# Patient Record
Sex: Male | Born: 1970 | Race: Black or African American | Hispanic: No | Marital: Single | Smoking: Current every day smoker
Health system: Southern US, Community
[De-identification: ages and names within clinical notes are randomized; demographics above are authoritative.]

## PROBLEM LIST (undated history)

## (undated) DIAGNOSIS — G479 Sleep disorder, unspecified: Secondary | ICD-10-CM

## (undated) DIAGNOSIS — F419 Anxiety disorder, unspecified: Secondary | ICD-10-CM

## (undated) DIAGNOSIS — F319 Bipolar disorder, unspecified: Secondary | ICD-10-CM

## (undated) DIAGNOSIS — E119 Type 2 diabetes mellitus without complications: Secondary | ICD-10-CM

## (undated) DIAGNOSIS — F209 Schizophrenia, unspecified: Secondary | ICD-10-CM

## (undated) DIAGNOSIS — Z720 Tobacco use: Secondary | ICD-10-CM

## (undated) DIAGNOSIS — F259 Schizoaffective disorder, unspecified: Secondary | ICD-10-CM

## (undated) DIAGNOSIS — F32A Depression, unspecified: Secondary | ICD-10-CM

## (undated) DIAGNOSIS — F1410 Cocaine abuse, uncomplicated: Secondary | ICD-10-CM

## (undated) DIAGNOSIS — F1010 Alcohol abuse, uncomplicated: Secondary | ICD-10-CM

## (undated) DIAGNOSIS — R45851 Suicidal ideations: Secondary | ICD-10-CM

## (undated) DIAGNOSIS — F329 Major depressive disorder, single episode, unspecified: Secondary | ICD-10-CM

## (undated) DIAGNOSIS — F1910 Other psychoactive substance abuse, uncomplicated: Secondary | ICD-10-CM

## (undated) DIAGNOSIS — F1210 Cannabis abuse, uncomplicated: Secondary | ICD-10-CM

---

## 1997-11-27 ENCOUNTER — Emergency Department (HOSPITAL_COMMUNITY): Admission: EM | Admit: 1997-11-27 | Discharge: 1997-11-27 | Payer: Self-pay | Admitting: Emergency Medicine

## 1998-04-27 ENCOUNTER — Emergency Department (HOSPITAL_COMMUNITY): Admission: EM | Admit: 1998-04-27 | Discharge: 1998-04-27 | Payer: Self-pay | Admitting: Emergency Medicine

## 1998-05-01 ENCOUNTER — Emergency Department (HOSPITAL_COMMUNITY): Admission: EM | Admit: 1998-05-01 | Discharge: 1998-05-01 | Payer: Self-pay | Admitting: Emergency Medicine

## 1998-05-04 ENCOUNTER — Emergency Department (HOSPITAL_COMMUNITY): Admission: EM | Admit: 1998-05-04 | Discharge: 1998-05-04 | Payer: Self-pay | Admitting: Emergency Medicine

## 1998-05-04 ENCOUNTER — Encounter: Payer: Self-pay | Admitting: Emergency Medicine

## 2001-03-29 ENCOUNTER — Encounter: Payer: Self-pay | Admitting: Internal Medicine

## 2001-03-29 ENCOUNTER — Inpatient Hospital Stay (HOSPITAL_COMMUNITY): Admission: EM | Admit: 2001-03-29 | Discharge: 2001-03-30 | Payer: Self-pay | Admitting: Emergency Medicine

## 2001-03-29 ENCOUNTER — Encounter: Payer: Self-pay | Admitting: Emergency Medicine

## 2002-07-09 ENCOUNTER — Encounter: Payer: Self-pay | Admitting: Emergency Medicine

## 2002-07-09 ENCOUNTER — Emergency Department (HOSPITAL_COMMUNITY): Admission: EM | Admit: 2002-07-09 | Discharge: 2002-07-09 | Payer: Self-pay | Admitting: *Deleted

## 2002-07-09 ENCOUNTER — Encounter: Payer: Self-pay | Admitting: *Deleted

## 2002-10-14 ENCOUNTER — Emergency Department (HOSPITAL_COMMUNITY): Admission: EM | Admit: 2002-10-14 | Discharge: 2002-10-14 | Payer: Self-pay | Admitting: Emergency Medicine

## 2003-02-04 ENCOUNTER — Emergency Department (HOSPITAL_COMMUNITY): Admission: EM | Admit: 2003-02-04 | Discharge: 2003-02-04 | Payer: Self-pay | Admitting: Emergency Medicine

## 2003-09-05 ENCOUNTER — Emergency Department (HOSPITAL_COMMUNITY): Admission: EM | Admit: 2003-09-05 | Discharge: 2003-09-05 | Payer: Self-pay | Admitting: Physical Therapy

## 2007-09-07 ENCOUNTER — Emergency Department (HOSPITAL_COMMUNITY): Admission: EM | Admit: 2007-09-07 | Discharge: 2007-09-07 | Payer: Self-pay | Admitting: Emergency Medicine

## 2008-02-01 ENCOUNTER — Emergency Department (HOSPITAL_COMMUNITY): Admission: EM | Admit: 2008-02-01 | Discharge: 2008-02-01 | Payer: Self-pay | Admitting: Emergency Medicine

## 2008-10-05 ENCOUNTER — Emergency Department (HOSPITAL_COMMUNITY): Admission: EM | Admit: 2008-10-05 | Discharge: 2008-10-06 | Payer: Self-pay | Admitting: Emergency Medicine

## 2009-08-08 ENCOUNTER — Emergency Department (HOSPITAL_COMMUNITY): Admission: EM | Admit: 2009-08-08 | Discharge: 2009-08-09 | Payer: Self-pay | Admitting: Emergency Medicine

## 2009-08-12 ENCOUNTER — Emergency Department: Payer: Self-pay | Admitting: Emergency Medicine

## 2010-03-08 ENCOUNTER — Emergency Department (HOSPITAL_COMMUNITY)
Admission: EM | Admit: 2010-03-08 | Discharge: 2010-03-08 | Payer: Self-pay | Source: Home / Self Care | Admitting: Emergency Medicine

## 2010-06-23 LAB — BASIC METABOLIC PANEL
BUN: 5 mg/dL — ABNORMAL LOW (ref 6–23)
CO2: 28 mEq/L (ref 19–32)
Calcium: 9.3 mg/dL (ref 8.4–10.5)
GFR calc non Af Amer: >60 mL/min (ref >60)

## 2010-06-23 LAB — CBC
HCT: 48.5 % (ref 39.0–52.0)
MCHC: 34 g/dL (ref 30.0–36.0)
Platelets: 167 10*3/uL (ref 150–400)
RBC: 5.18 MIL/uL (ref 4.22–5.81)
RDW: 13.3 % (ref 11.5–15.5)

## 2010-06-23 LAB — URINALYSIS, ROUTINE W REFLEX MICROSCOPIC
Bilirubin Urine: NEGATIVE
Hgb urine dipstick: NEGATIVE
Leukocytes, UA: NEGATIVE
Urobilinogen, UA: 0.2 mg/dL (ref 0.0–1.0)

## 2010-06-23 LAB — DIFFERENTIAL
Basophils Absolute: 0.1 10*3/uL (ref 0.0–0.1)
Basophils Relative: 0 % (ref 0–1)
Eosinophils Absolute: 0.1 10*3/uL (ref 0.0–0.7)
Eosinophils Relative: 1 % (ref 0–5)
Lymphs Abs: 4.8 10*3/uL — ABNORMAL HIGH (ref 0.7–4.0)
Neutro Abs: 7.4 10*3/uL (ref 1.7–7.7)
Neutrophils Relative %: 57 % (ref 43–77)

## 2010-06-23 LAB — URINE MICROSCOPIC-ADD ON

## 2010-07-12 LAB — POCT I-STAT, CHEM 8
BUN: 6 mg/dL (ref 6–23)
Calcium, Ion: 1.13 mmol/L (ref 1.12–1.32)
Chloride: 108 mEq/L (ref 96–112)
Potassium: 3.4 mEq/L — ABNORMAL LOW (ref 3.5–5.1)
Sodium: 144 mEq/L (ref 135–145)
TCO2: 23 mmol/L (ref 0–100)

## 2010-07-12 LAB — HEPATIC FUNCTION PANEL
ALT: 32 U/L (ref 0–53)
Albumin: 4 g/dL (ref 3.5–5.2)
Alkaline Phosphatase: 83 U/L (ref 39–117)
Bilirubin, Direct: 0.1 mg/dL (ref 0.0–0.3)
Total Protein: 6.9 g/dL (ref 6.0–8.3)

## 2010-07-12 LAB — CBC
Hemoglobin: 16.5 g/dL (ref 13.0–17.0)
RDW: 14.4 % (ref 11.5–15.5)
WBC: 8.6 10*3/uL (ref 4.0–10.5)

## 2010-07-12 LAB — RAPID URINE DRUG SCREEN, HOSP PERFORMED
Benzodiazepines: NOT DETECTED
Opiates: NOT DETECTED

## 2010-07-12 LAB — DIFFERENTIAL
Eosinophils Relative: 0 % (ref 0–5)
Monocytes Relative: 5 % (ref 3–12)
Neutrophils Relative %: 69 % (ref 43–77)

## 2010-08-21 NOTE — Discharge Summary (Signed)
[facility]  Patient:    GEARL, BARATTA Visit Number: [id number] MRN: [id number]          Service Type: MED Location: [facility] [id number] [id number] Attending Physician:  Madaline Guthrie Dictated by:   Dante Gang, M.D. Admit Date:  03/29/2001 Discharge Date: 03/30/2001                             Discharge Summary  CONSULTATION:  Dr. Jeanie Sewer, psychiatry.  DISCHARGE DIAGNOSES: 1. Polysubstance overdose. 2. Initially increased cardiac enzymes and electrocardiogram changes.  DISCHARGE MEDICATIONS:  None.  PROCEDURES: 1. Electrocardiogram at the time of admission showed normal sinus rhythm with    first-degree atrial valve block, regular sinus rhythm prime in V1    suggesting an incomplete right bundle branch block and upsloping ST    depression in I, aVL, V2 and V3. 2. Computed tomography of the head without contrast on December 25, was    negative. 3. Portable chest x-ray done on March 29, 2001, showed no active    cardiopulmonary process.  HISTORY OF PRESENT ILLNESS:  Mr. Steven Christensen is a 40 year old man who was found in a parking lot being restrained by the [facility] Department after a cocaine and ethanol ingestion of unknown amount.  This was followed by a large amount of Aleve and ibuprofen.  He was very agitated at the time and repeating loudly that he wanted to die.  He was brought to the emergency department and secured in handcuffs.  He received 2 mg of Narcan in the ambulance and became increasingly agitated, then he calmed somewhat.  He was further sedated with 10 mg of Haldol and 1 mg of Ativan after continuing to be hostile and threatening to everyone in the emergency department.  PHYSICAL EXAMINATION:  VITAL SIGNS:  Temperature 98.3, blood pressure ranged from 115/32 to 154/46, pulse 80-88, respiratory rate 20, O2 saturations 90-97% on room air.  GENERAL:  This is a healthy-appearing, 40 year old man who was sleeping  at the time of exam.  He was arousable and followed simple commands sometimes, but was not oriented or cooperative.  HEENT:  His pupils were pinpoint.  Extraocular movements appeared intact.  HEART:  Regular with no murmurs, rubs or gallops.  LUNGS:  He had coarse breath sounds bilaterally and anteriorly with vocalization on breathing.  ABDOMEN:  Soft with positive bowel sounds.  He had no edema.  He was moving all four extremities equally and had no obvious focal signs.  MUSCULOSKELETAL:  Appeared intact.  Radial and posterior tibial pulses were 2+ and regular.  SKIN:  No rashes or lesions.  LABORATORY DATA AND X-RAY FINDINGS:  White blood count 10.8, 50% neutrophils, 34% lymphs, 6% monos.  Hemoglobin 16.5, platelets 185.  Sodium 141, potassium 3.1, chloride 107, bicarb 28, BUN 9, creatinine 1.0, glucose 80, calcium 8.5. CK 607, MB 4.6, troponin 0.01.  AST 31, ALT 32, Alk phos 76, total bilirubin 0.3, total protein 7.0, albumin 3.7.  Initial CK 607, CK-MB 4.6 and troponin 0.01.  Alcohol level was 109.  UDS was positive for cocaine and cannabinoids. A UA was unremarkable.  HOSPITAL COURSE:  The patient was well-sedated on Haldol and Ativan.  He was admitted to step-down unit with a 24-hour sitter and a psychiatry consult was ordered when he was talking.  Leather restraints were ordered, but were later removed when the patient calmed down considerably later that night.  He did  not have any further pain or complaints.  On December 26, he was seen by Dr. Antonietta Breach, of psychiatry, who believed that he was not an acute suicide risk and that he was okay for discharge from his standpoint.  His cardiac enzymes were negative throughout the admission and he had no changes on telemetry.  He also had no withdrawal symptoms from his substance abuse problems.  He was discharged home on December 26, with instructions to follow up with [facility].  FOLLOWUP:  Mr. Worrell was instructed to call  and schedule an appointment to see Dr. Alfonse Alpers for general medical care one week following discharge.  He was also encouraged to follow up with alcohol and drug services and to call 911 if he experienced any suicidal ideation. Dictated by:   Dante Gang, M.D. Attending Physician:  Madaline Guthrie DD:  05/29/01 TD:  05/30/01 Job: [id number]

## 2010-12-31 LAB — CBC
HCT: 50.5
MCHC: 34.4
MCV: 92.6
RDW: 14.5

## 2010-12-31 LAB — RAPID URINE DRUG SCREEN, HOSP PERFORMED
Cocaine: POSITIVE — AB
Tetrahydrocannabinol: POSITIVE — AB

## 2010-12-31 LAB — COMPREHENSIVE METABOLIC PANEL
AST: 33
Albumin: 4
Calcium: 9.2
Creatinine, Ser: 1.04
GFR calc Af Amer: >60
Glucose, Bld: 98
Total Bilirubin: 0.7
Total Protein: 7.2

## 2010-12-31 LAB — URINALYSIS, ROUTINE W REFLEX MICROSCOPIC
Bilirubin Urine: NEGATIVE
Glucose, UA: NEGATIVE
Ketones, ur: NEGATIVE
Urobilinogen, UA: 0.2
pH: 5.5

## 2010-12-31 LAB — ETHANOL: Alcohol, Ethyl (B): <5

## 2011-01-04 LAB — POCT I-STAT, CHEM 8
BUN: 13
HCT: 49

## 2011-01-04 LAB — TRICYCLICS SCREEN, URINE: TCA Scrn: NOT DETECTED

## 2011-01-04 LAB — CBC
Hemoglobin: 16.2
Platelets: 178
RBC: 5.31
RDW: 14.1

## 2011-01-04 LAB — DIFFERENTIAL
Basophils Absolute: 0
Eosinophils Relative: 1
Lymphocytes Relative: 25
Lymphs Abs: 1.7
Monocytes Absolute: 0.4
Neutro Abs: 4.7
Neutrophils Relative %: 69

## 2011-01-04 LAB — RAPID URINE DRUG SCREEN, HOSP PERFORMED
Benzodiazepines: NOT DETECTED
Cocaine: POSITIVE — AB

## 2011-03-25 ENCOUNTER — Encounter: Payer: Self-pay | Admitting: Emergency Medicine

## 2011-03-25 ENCOUNTER — Emergency Department (HOSPITAL_COMMUNITY)
Admission: EM | Admit: 2011-03-25 | Discharge: 2011-03-26 | Disposition: A | Discharge status: Home / Self Care | Payer: Medicaid Other | Attending: Emergency Medicine | Admitting: Emergency Medicine

## 2011-03-25 DIAGNOSIS — F411 Generalized anxiety disorder: Secondary | ICD-10-CM | POA: Insufficient documentation

## 2011-03-25 DIAGNOSIS — E119 Type 2 diabetes mellitus without complications: Secondary | ICD-10-CM | POA: Insufficient documentation

## 2011-03-25 DIAGNOSIS — F17200 Nicotine dependence, unspecified, uncomplicated: Secondary | ICD-10-CM | POA: Insufficient documentation

## 2011-03-25 DIAGNOSIS — F1010 Alcohol abuse, uncomplicated: Secondary | ICD-10-CM | POA: Insufficient documentation

## 2011-03-25 HISTORY — DX: Major depressive disorder, single episode, unspecified: F32.9

## 2011-03-25 HISTORY — DX: Anxiety disorder, unspecified: F41.9

## 2011-03-25 HISTORY — DX: Sleep disorder, unspecified: G47.9

## 2011-03-25 HISTORY — DX: Depression, unspecified: F32.A

## 2011-03-25 LAB — RAPID URINE DRUG SCREEN, HOSP PERFORMED
Amphetamines: NOT DETECTED
Benzodiazepines: NOT DETECTED
Opiates: NOT DETECTED
Tetrahydrocannabinol: NOT DETECTED

## 2011-03-25 NOTE — ED Provider Notes (Signed)
History     CSN: [id number]  Arrival date & time 03/25/11  2123   First MD Initiated Contact with Patient 03/25/11 2236      Chief Complaint  Patient presents with  . Medical Clearance    IVC papers  . Suicidal    (Consider location/radiation/quality/duration/timing/severity/associated sxs/prior treatment) HPI Comments: Patient brought in by [employer] with IVC paperwork.  Patient allegedly stated that he was going to kill himself. Patient states that he was drinking alcohol earlier and he was in an argument however denies suicidal ideation. He denies medical complaints. His history of diabetes.  Patient is a 40 y.o. male presenting with mental health disorder. The history is provided by the patient and the police.  Mental Health Problem  The onset of the illness is precipitated by alcohol abuse. Additional symptoms of the illness do not include no headaches or no abdominal pain. He admits to suicidal ideas.    Past Medical History  Diagnosis Date  . Diabetes mellitus   . Sleeping difficulties   . Anxiety     History reviewed. No pertinent past surgical history.  History reviewed. No pertinent family history.  History  Substance Use Topics  . Smoking status: Current Everyday Smoker  . Smokeless tobacco: Not on file  . Alcohol Use: Yes      Review of Systems  Constitutional: Negative for fever and chills.  HENT: Negative for sore throat and rhinorrhea.   Eyes: Negative for redness.  Respiratory: Negative for shortness of breath.   Cardiovascular: Negative for chest pain.  Gastrointestinal: Negative for nausea, vomiting, abdominal pain, diarrhea and constipation.  Genitourinary: Negative for dysuria.  Musculoskeletal: Negative for myalgias.  Skin: Negative for rash.  Neurological: Negative for dizziness and headaches.  Psychiatric/Behavioral: Negative for suicidal ideas.    Allergies  Review of patient's allergies indicates no known allergies.  Home Medications    No current outpatient prescriptions on file.  BP 127/84  Pulse 102  Temp(Src) 98.2 F (36.8 C) (Oral)  Resp 16  SpO2 93%  Physical Exam  Nursing note and vitals reviewed. Constitutional: He is oriented to person, place, and time. He appears well-developed and well-nourished.  HENT:  Head: Normocephalic and atraumatic.  Eyes: Conjunctivae are normal. Right eye exhibits no discharge. Left eye exhibits no discharge.  Neck: Normal range of motion. Neck supple.  Cardiovascular: Normal rate, regular rhythm and normal heart sounds.   Pulmonary/Chest: Effort normal and breath sounds normal.  Abdominal: Soft. Bowel sounds are normal. There is no tenderness. There is no rebound and no guarding.  Musculoskeletal: He exhibits no edema.  Neurological: He is alert and oriented to person, place, and time.  Skin: Skin is warm and dry.  Psychiatric: He has a normal mood and affect.    ED Course  Procedures (including critical care time)  Labs Reviewed  CBC - Abnormal; Notable for the following:    Hemoglobin 17.5 (*)    MCHC 36.5 (*)    Platelets 146 (*)    All other components within normal limits  DIFFERENTIAL - Abnormal; Notable for the following:    Lymphocytes Relative 49 (*)    Lymphs Abs 4.7 (*)    All other components within normal limits  ETHANOL - Abnormal; Notable for the following:    Alcohol, Ethyl (B) 165 (*)    All other components within normal limits  COMPREHENSIVE METABOLIC PANEL - Abnormal; Notable for the following:    Glucose, Bld 255 (*)    Total Bilirubin  0.2 (*)    All other components within normal limits  URINE RAPID DRUG SCREEN (HOSP PERFORMED)   No results found.   1. Suicidal ideation     11:18 PM Patient seen and examined. Workup ordered. Anticipate calling ACT when results return.   12:37 AM ACT notified and will evaluate patient. Handoff to Dr. Bebe Shaggy.   MDM  IVC -- suicidal ideation.         Carolee Rota, [place] 03/26/11 [phone]

## 2011-03-25 NOTE — ED Notes (Signed)
Pt presented to the ER in company of [facility] Department, pt with IVC papers, mother called in, per mother pt stated that he wanted to hurt himself, however, pt denies that he said anything like that. Pt states he had verbal argument with his fiancee and mother, pt at that times got angry, states walked out the house to "cool off" and went to the store to get himself a drink, states bought 2 beers, also store clerk told the officers that he overheard that pt said, while on the phone, he wanted to kill himself, pt denies this too.

## 2011-03-26 ENCOUNTER — Encounter (HOSPITAL_COMMUNITY): Payer: Self-pay | Admitting: *Deleted

## 2011-03-26 LAB — GLUCOSE, CAPILLARY
Glucose-Capillary: 244 mg/dL — ABNORMAL HIGH (ref 70–99)
Glucose-Capillary: 182 mg/dL — ABNORMAL HIGH (ref 70–99)

## 2011-03-26 LAB — CBC
HCT: 48 % (ref 39.0–52.0)
MCV: 87 fL (ref 78.0–100.0)
Platelets: 146 10*3/uL — ABNORMAL LOW (ref 150–400)
RBC: 5.52 MIL/uL (ref 4.22–5.81)
RDW: 13.4 % (ref 11.5–15.5)
WBC: 9.6 10*3/uL (ref 4.0–10.5)

## 2011-03-26 LAB — COMPREHENSIVE METABOLIC PANEL
BUN: 7 mg/dL (ref 6–23)
CO2: 22 mEq/L (ref 19–32)
Calcium: 9.2 mg/dL (ref 8.4–10.5)
Creatinine, Ser: 0.8 mg/dL (ref 0.50–1.35)
GFR calc Af Amer: >90 mL/min (ref >90)
GFR calc non Af Amer: >90 mL/min (ref >90)
Glucose, Bld: 255 mg/dL — ABNORMAL HIGH (ref 70–99)
Total Protein: 7.9 g/dL (ref 6.0–8.3)

## 2011-03-26 LAB — DIFFERENTIAL
Basophils Absolute: 0 10*3/uL (ref 0.0–0.1)
Eosinophils Relative: 1 % (ref 0–5)
Lymphocytes Relative: 49 % — ABNORMAL HIGH (ref 12–46)
Lymphs Abs: 4.7 10*3/uL — ABNORMAL HIGH (ref 0.7–4.0)
Monocytes Absolute: 0.5 10*3/uL (ref 0.1–1.0)
Neutro Abs: 4.3 10*3/uL (ref 1.7–7.7)

## 2011-03-26 MED ORDER — LORAZEPAM 1 MG PO TABS: 1.0000 mg | ORAL_TABLET | Freq: Three times a day (TID) | ORAL | Status: DC | PRN

## 2011-03-26 MED ORDER — NICOTINE 21 MG/24HR TD PT24: 21.0000 mg | MEDICATED_PATCH | Freq: Every day | TRANSDERMAL | Status: DC

## 2011-03-26 MED ORDER — IBUPROFEN 600 MG PO TABS: 600.0000 mg | ORAL_TABLET | Freq: Three times a day (TID) | ORAL | Status: DC | PRN

## 2011-03-26 MED ORDER — ONDANSETRON HCL 4 MG PO TABS: 4.0000 mg | ORAL_TABLET | Freq: Three times a day (TID) | ORAL | Status: DC | PRN

## 2011-03-26 MED ORDER — ALUM & MAG HYDROXIDE-SIMETH 200-200-20 MG/5ML PO SUSP: 30.0000 mL | ORAL | Status: DC | PRN

## 2011-03-26 NOTE — ED Provider Notes (Addendum)
Medical screening examination/treatment/procedure(s) were conducted as a shared visit with non-physician practitioner(s) and myself.  I personally evaluated the patient during the encounter   Joya Gaskins, MD 03/26/11 0756  Pt seen by telepsych Stable for d/c BP 137/77  Pulse 90  Temp(Src) 98.2 F (36.8 C) (Oral)  Resp 16  SpO2 98%   Joya Gaskins, MD 03/26/11 [phone]

## 2011-03-26 NOTE — ED Notes (Signed)
Performed ordered CBG, pt pending d/c
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
## 2011-03-26 NOTE — ED Notes (Signed)
Pt ate 0% of breakfast

## 2011-03-26 NOTE — ED Notes (Signed)
Discharged home w/110.69 from security

## 2011-04-18 ENCOUNTER — Emergency Department: Payer: Self-pay | Admitting: Internal Medicine

## 2011-04-18 LAB — URINALYSIS, COMPLETE
Nitrite: NEGATIVE
Protein: 100
RBC,UR: 16 /HPF (ref 0–5)
WBC UR: 32 /HPF (ref 0–5)

## 2011-04-18 LAB — CBC
HGB: 17.4 g/dL (ref 13.0–18.0)
MCH: 30.9 pg (ref 26.0–34.0)
MCHC: 33.4 g/dL (ref 32.0–36.0)
Platelet: 141 10*3/uL — ABNORMAL LOW (ref 150–440)
RDW: 13.8 % (ref 11.5–14.5)

## 2011-04-18 LAB — BASIC METABOLIC PANEL
Calcium, Total: 9.2 mg/dL (ref 8.5–10.1)
Chloride: 103 mmol/L (ref 98–107)
Co2: 30 mmol/L (ref 21–32)
EGFR (Non-African Amer.): >60
Glucose: 199 mg/dL — ABNORMAL HIGH (ref 65–99)
Osmolality: 286 (ref 275–301)
Potassium: 4.1 mmol/L (ref 3.5–5.1)
Sodium: 141 mmol/L (ref 136–145)

## 2011-04-26 IMAGING — CT CT ABD-PELV W/ CM
2 of 5 series · 14 of 32 positions shown, 19 images · IV contrast (omnipaque)
Comparison: None available.

CLINICAL DATA: Abdominal pain and bloating.  Question abscess.

CT ABDOMEN AND PELVIS WITH CONTRAST
TECHNIQUE: Multidetector CT imaging of the abdomen and pelvis was
performed following the standard protocol during bolus
administration of intravenous contrast.
Contrast: 100 ml Omnipaque-L88.

[Series 2: routine abdomen · axial · 0.74mm/px · z∈[-475,-85]mm · 7 of 104 slices shown, 12 images]
[im 13/104  soft-tissue]
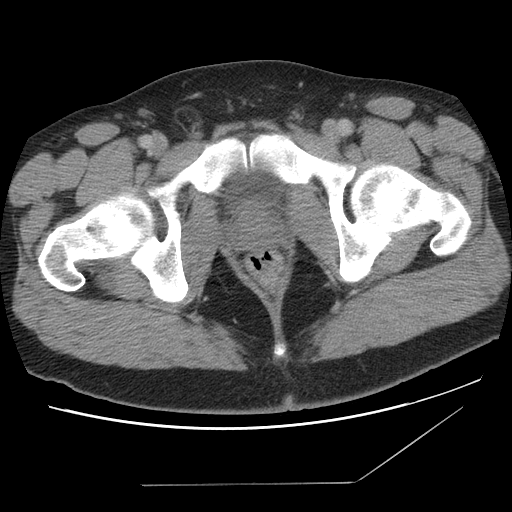
[im 13/104  bone]
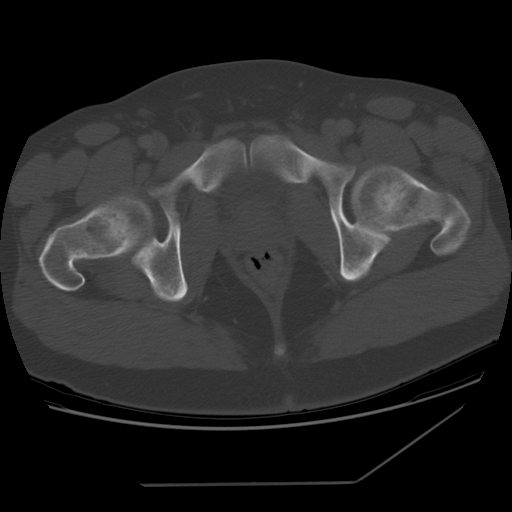
[im 26/104  soft-tissue]
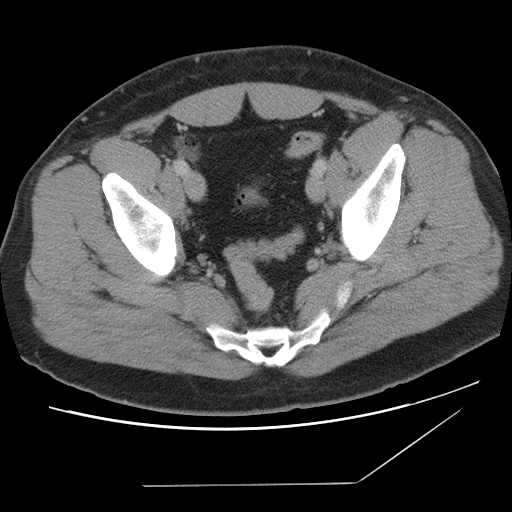
[im 39/104  soft-tissue]
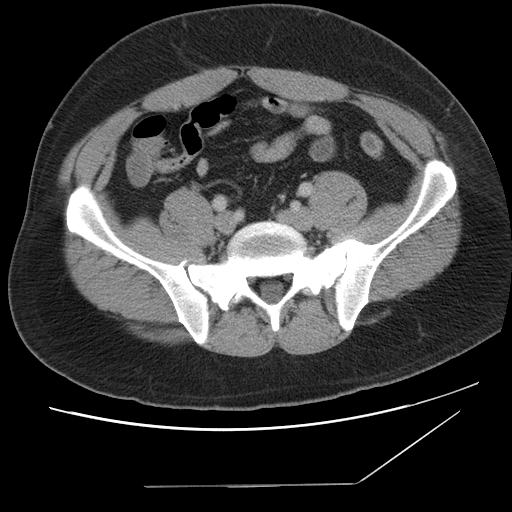
[im 52/104  soft-tissue]
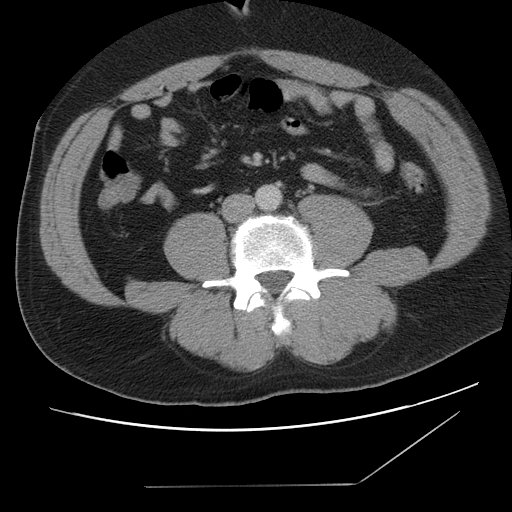
[im 52/104  lung]
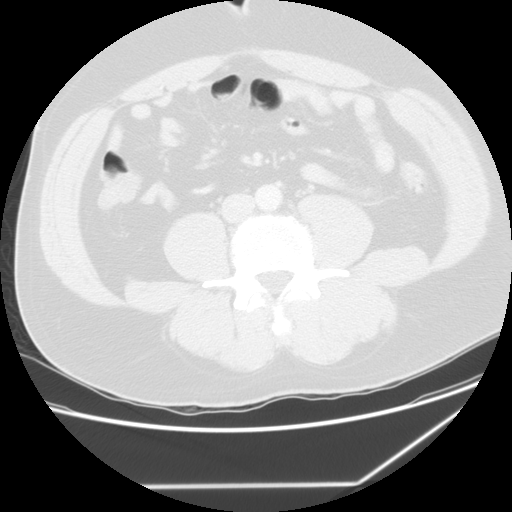
[im 65/104  soft-tissue]
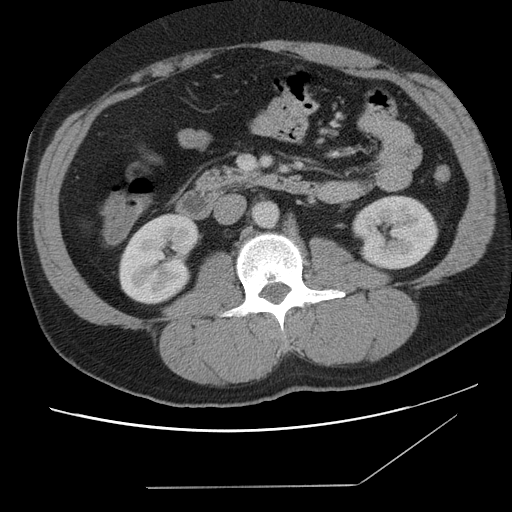
[im 65/104  lung]
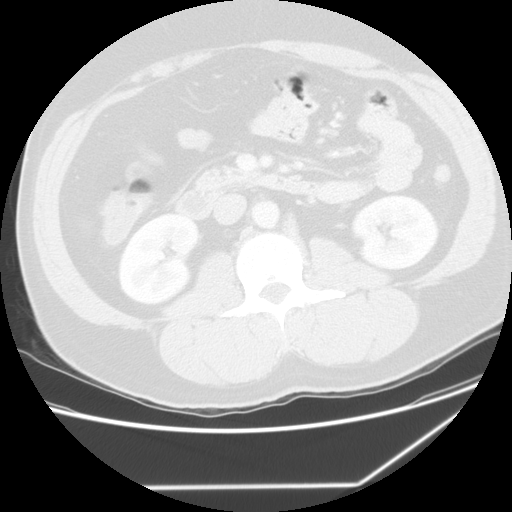
[im 78/104  soft-tissue]
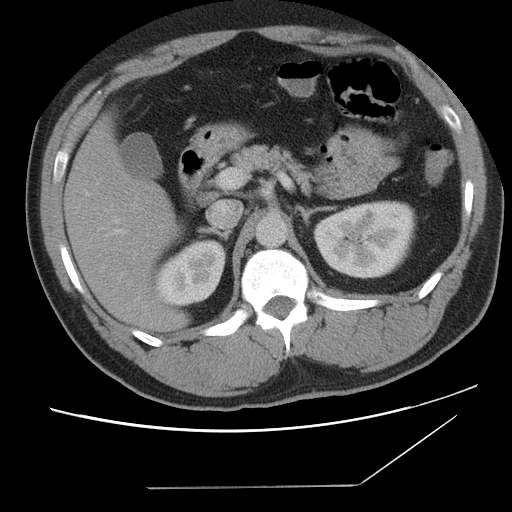
[im 78/104  lung]
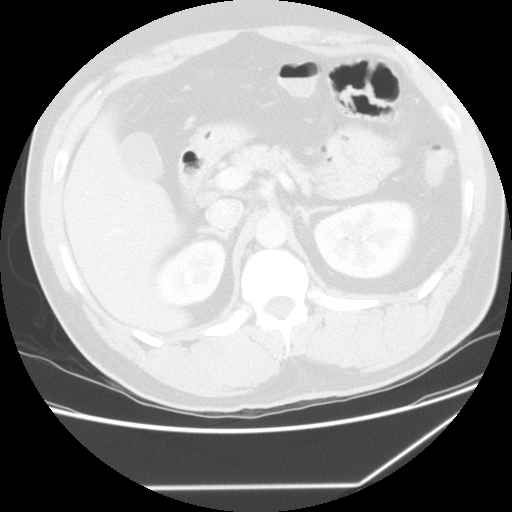
[im 91/104  soft-tissue]
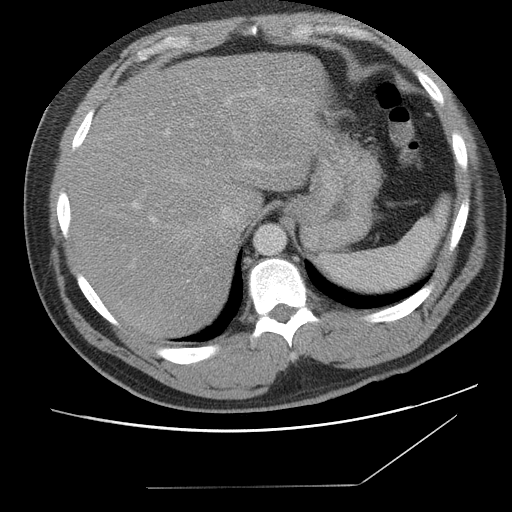
[im 91/104  lung]
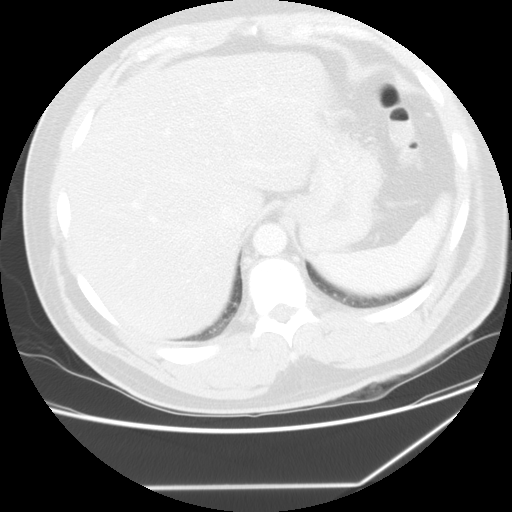

[Series 400: reformatted · sagittal · 1.06mm/px · 7 of 122 slices shown]
[im 14/122  soft-tissue]
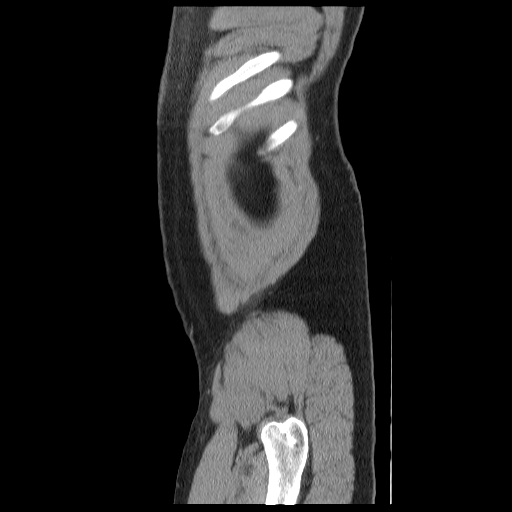
[im 27/122  soft-tissue]
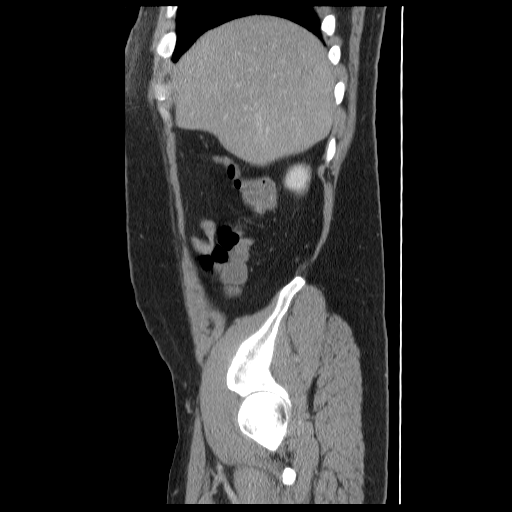
[im 41/122  soft-tissue]
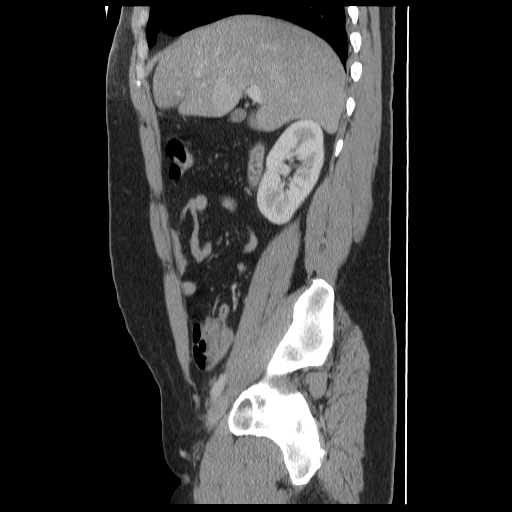
[im 54/122  soft-tissue]
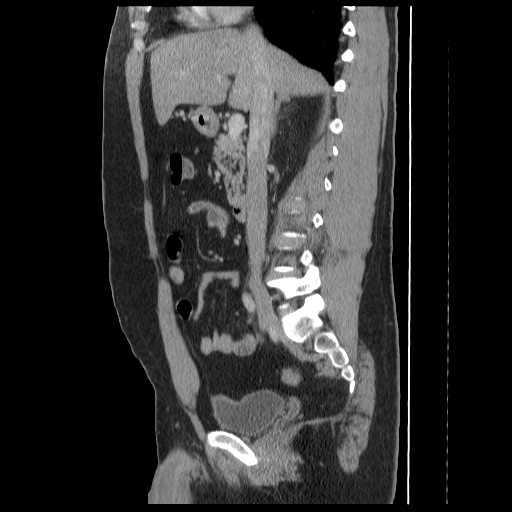
[im 68/122  soft-tissue]
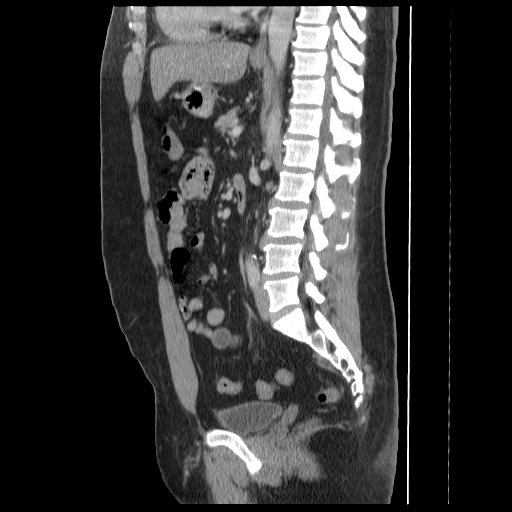
[im 81/122  soft-tissue]
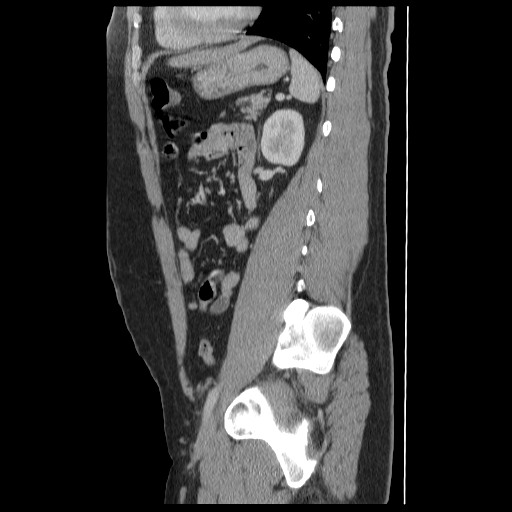
[im 95/122  soft-tissue]
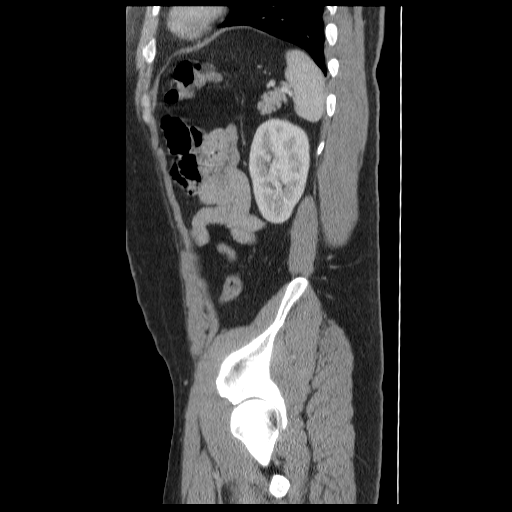

[14 of 32 positions shown; findings below may reference images not displayed]

FINDINGS: There is mild dependent atelectatic change in the lung
bases.  No pleural or pericardial effusion.

The liver, gallbladder, adrenal glands, spleen, pancreas and
kidneys all appear normal.  There is no abscess.  Stomach and small
and large bowel are unremarkable in appearance.  The appendix is
not discretely visualized and may have been removed.  There is no
evidence inflammatory change in the right lower quadrant or
elsewhere.  No focal bony abnormality.
IMPRESSION: Negative exam.  No abscess.

## 2011-07-06 ENCOUNTER — Emergency Department (HOSPITAL_COMMUNITY)
Admission: EM | Admit: 2011-07-06 | Discharge: 2011-07-06 | Disposition: A | Discharge status: Home / Self Care | Payer: Medicaid Other | Attending: Emergency Medicine | Admitting: Emergency Medicine

## 2011-07-06 ENCOUNTER — Encounter (HOSPITAL_COMMUNITY): Payer: Self-pay

## 2011-07-06 DIAGNOSIS — F419 Anxiety disorder, unspecified: Secondary | ICD-10-CM

## 2011-07-06 DIAGNOSIS — E119 Type 2 diabetes mellitus without complications: Secondary | ICD-10-CM | POA: Insufficient documentation

## 2011-07-06 DIAGNOSIS — F411 Generalized anxiety disorder: Secondary | ICD-10-CM | POA: Insufficient documentation

## 2011-07-06 DIAGNOSIS — Z79899 Other long term (current) drug therapy: Secondary | ICD-10-CM | POA: Insufficient documentation

## 2011-07-06 LAB — CBC
HCT: 48.8 % (ref 39.0–52.0)
Hemoglobin: 16.7 g/dL (ref 13.0–17.0)
MCH: 31.4 pg (ref 26.0–34.0)
MCHC: 34.2 g/dL (ref 30.0–36.0)
MCV: 91.7 fL (ref 78.0–100.0)
Platelets: 162 10*3/uL (ref 150–400)
RBC: 5.32 MIL/uL (ref 4.22–5.81)
RDW: 14 % (ref 11.5–15.5)
WBC: 9 10*3/uL (ref 4.0–10.5)

## 2011-07-06 LAB — RAPID URINE DRUG SCREEN, HOSP PERFORMED
Amphetamines: NOT DETECTED
Barbiturates: NOT DETECTED
Benzodiazepines: NOT DETECTED
Cocaine: POSITIVE — AB
Opiates: NOT DETECTED
Tetrahydrocannabinol: NOT DETECTED

## 2011-07-06 LAB — COMPREHENSIVE METABOLIC PANEL
ALT: 23 U/L (ref 0–53)
AST: 23 U/L (ref 0–37)
Albumin: 4 g/dL (ref 3.5–5.2)
Alkaline Phosphatase: 98 U/L (ref 39–117)
BUN: 8 mg/dL (ref 6–23)
CO2: 25 mEq/L (ref 19–32)
Calcium: 8.6 mg/dL (ref 8.4–10.5)
Chloride: 107 mEq/L (ref 96–112)
Creatinine, Ser: 0.78 mg/dL (ref 0.50–1.35)
GFR calc Af Amer: >90 mL/min (ref >90)
GFR calc non Af Amer: >90 mL/min (ref >90)
Glucose, Bld: 240 mg/dL — ABNORMAL HIGH (ref 70–99)
Potassium: 3.6 mEq/L (ref 3.5–5.1)
Sodium: 142 mEq/L (ref 135–145)
Total Bilirubin: 0.2 mg/dL — ABNORMAL LOW (ref 0.3–1.2)
Total Protein: 7.6 g/dL (ref 6.0–8.3)

## 2011-07-06 LAB — ETHANOL: Alcohol, Ethyl (B): 120 mg/dL — ABNORMAL HIGH (ref 0–11)

## 2011-07-06 LAB — DIFFERENTIAL
Basophils Absolute: 0 10*3/uL (ref 0.0–0.1)
Basophils Relative: 0 % (ref 0–1)
Eosinophils Absolute: 0.1 10*3/uL (ref 0.0–0.7)
Eosinophils Relative: 1 % (ref 0–5)
Lymphocytes Relative: 45 % (ref 12–46)
Lymphs Abs: 4.1 10*3/uL — ABNORMAL HIGH (ref 0.7–4.0)
Monocytes Absolute: 0.5 10*3/uL (ref 0.1–1.0)
Monocytes Relative: 5 % (ref 3–12)
Neutro Abs: 4.4 10*3/uL (ref 1.7–7.7)
Neutrophils Relative %: 49 % (ref 43–77)

## 2011-07-06 MED ORDER — LORAZEPAM 1 MG PO TABS: 1.0000 mg | ORAL_TABLET | Freq: Three times a day (TID) | ORAL | Status: DC | PRN

## 2011-07-06 MED ORDER — ALUM & MAG HYDROXIDE-SIMETH 200-200-20 MG/5ML PO SUSP: 30.0000 mL | ORAL | Status: DC | PRN

## 2011-07-06 MED ORDER — IBUPROFEN 600 MG PO TABS: 600.0000 mg | ORAL_TABLET | Freq: Three times a day (TID) | ORAL | Status: DC | PRN

## 2011-07-06 MED ORDER — NICOTINE 21 MG/24HR TD PT24
21.0000 mg | MEDICATED_PATCH | Freq: Every day | TRANSDERMAL | Status: DC
  Administered 2011-07-06: 21 mg via TRANSDERMAL
  Filled 2011-07-06: qty 1

## 2011-07-06 MED ORDER — GABAPENTIN 300 MG PO CAPS
300.0000 mg | ORAL_CAPSULE | Freq: Three times a day (TID) | ORAL | Status: DC
  Administered 2011-07-06: 300 mg via ORAL
  Filled 2011-07-06 (×3): qty 1

## 2011-07-06 MED ORDER — ZOLPIDEM TARTRATE 5 MG PO TABS: 5.0000 mg | ORAL_TABLET | Freq: Every evening | ORAL | Status: DC | PRN

## 2011-07-06 MED ORDER — ONDANSETRON HCL 4 MG PO TABS: 4.0000 mg | ORAL_TABLET | Freq: Three times a day (TID) | ORAL | Status: DC | PRN

## 2011-07-06 MED ORDER — ACETAMINOPHEN 325 MG PO TABS: 650.0000 mg | ORAL_TABLET | ORAL | Status: DC | PRN

## 2011-07-06 MED ORDER — GLIPIZIDE ER 5 MG PO TB24
5.0000 mg | ORAL_TABLET | Freq: Every day | ORAL | Status: DC
  Administered 2011-07-06: 5 mg via ORAL
  Filled 2011-07-06: qty 1

## 2011-07-06 NOTE — ED Provider Notes (Signed)
History     CSN: [id number]  Arrival date & time 07/06/11  0545   First MD Initiated Contact with Patient 07/06/11 0631      Chief Complaint  Patient presents with  . Medical Clearance    (Consider location/radiation/quality/duration/timing/severity/associated sxs/prior treatment) HPI Comments: The patient is a 41 year old male who presents to the emergency department with a request for medical clearance. He is escorted by the sheriff's deputy. He has IVC paperwork that has been taken out by his fiance, reporting that he has been saying people are "after him", threatening to kill himself, and has not been taking his medications. On questioning, the patient denies that he thinks anyone is out to get him, stating "if someone was out to get me, do you think I would be in the emergency department". He denies any auditory or visual hallucinations. He denies any suicidal or homicidal ideation. He reports that he has been taking his medications as directed. He does admit to daily alcohol use. Admits to cocaine use. Does not feel he needs assistance with these issues. Denies any pain.  The history is provided by the patient and the police.    Past Medical History  Diagnosis Date  . Diabetes mellitus   . Sleeping difficulties   . Anxiety   . Depression     History reviewed. No pertinent past surgical history.  History reviewed. No pertinent family history.  History  Substance Use Topics  . Smoking status: Current Everyday Smoker  . Smokeless tobacco: Not on file  . Alcohol Use: Yes      Review of Systems  Constitutional: Negative for fever and chills.  HENT: Negative for ear pain, congestion, sore throat, neck pain and neck stiffness.   Eyes: Negative for pain and visual disturbance.  Respiratory: Negative for cough, chest tightness and shortness of breath.   Cardiovascular: Negative for chest pain and leg swelling.  Gastrointestinal: Negative for nausea, vomiting, abdominal  pain, diarrhea and constipation.  Genitourinary: Negative for dysuria, hematuria and flank pain.  Musculoskeletal: Negative for back pain and gait problem.  Skin: Negative for rash and wound.  Neurological: Negative for dizziness, syncope, light-headedness, numbness and headaches.  Psychiatric/Behavioral: Negative for suicidal ideas, hallucinations, behavioral problems, confusion and self-injury.    Allergies  Review of patient's allergies indicates no known allergies.  Home Medications   Current Outpatient Rx  Name Route Sig Dispense Refill  . GABAPENTIN 300 MG PO CAPS Oral Take 300 mg by mouth 3 (three) times daily.    [name] GLIPIZIDE ER 5 MG PO TB24 Oral Take 5 mg by mouth daily.      . TRAZODONE HCL 50 MG PO TABS Oral Take 100 mg by mouth at bedtime.        BP 145/75  Pulse 81  Temp(Src) 98.5 F (36.9 C) (Oral)  Resp 20  SpO2 98%  Physical Exam  Nursing note and vitals reviewed. Constitutional: He is oriented to person, place, and time. He appears well-developed and well-nourished. No distress.  HENT:  Head: Normocephalic and atraumatic.  Right Ear: External ear normal.  Left Ear: External ear normal.  Mouth/Throat: Oropharynx is clear and moist.  Eyes: EOM are normal. Pupils are equal, round, and reactive to light.  Neck: Normal range of motion. Neck supple.  Cardiovascular: Normal rate, regular rhythm, normal heart sounds and intact distal pulses.   No murmur heard. Pulmonary/Chest: Effort normal and breath sounds normal. No respiratory distress. He has no wheezes. He has no rales. He  exhibits no tenderness.  Abdominal: Soft. Bowel sounds are normal. He exhibits no distension. There is no tenderness. There is no rebound and no guarding.  Musculoskeletal: He exhibits no edema and no tenderness.  Neurological: He is alert and oriented to person, place, and time. No cranial nerve deficit.  Skin: Skin is warm and dry.  Psychiatric: He has a normal mood and affect. His speech  is normal and behavior is normal. Judgment normal. Thought content is not paranoid. He expresses no homicidal and no suicidal ideation.    ED Course  Procedures (including critical care time)  Labs Reviewed  DIFFERENTIAL - Abnormal; Notable for the following:    Lymphs Abs 4.1 (*)    All other components within normal limits  COMPREHENSIVE METABOLIC PANEL - Abnormal; Notable for the following:    Glucose, Bld 240 (*)    Total Bilirubin 0.2 (*)    All other components within normal limits  ETHANOL - Abnormal; Notable for the following:    Alcohol, Ethyl (B) 120 (*)    All other components within normal limits  CBC  URINE RAPID DRUG SCREEN (HOSP PERFORMED)   No results found.   Dx 1: Anxiety Dx 2: DM   MDM  Pt seen and evaluated. Although paperwork reports SI, paranoia, and non-compliance with medications, I am not certain this is true. There was apparently a dispute between the patient and the petitioner and her reliability is questionable. On my exam, he appears to have good insight into his medical and psychiatric diagnoses. Does not appear to be hallucinating. Does not express SI or HI. We will wait for medical clearance labs to result and consult the ACT team.  8:43 AM I have spoken with Jessie Foot, ACT counselor regarding this patient. We are awaiting a tele-psych consult.     12:12 PM Tele-psych recommends outpatient follow-up and rescinding IVC paperwork; I agree with this recommendation. Pt will be d/c home.  Shaaron Adler, PA-C 07/06/11 1213
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
## 2011-07-06 NOTE — Discharge Instructions (Signed)
Please return to the ER for re-evaluation if you develop thoughts of hurting yourself or others.Please continue to take your medications as directed.   Depression You have signs of depression. This is a common problem. It can occur at any age. It is often hard to recognize. People can suffer from depression and still have moments of enjoyment. Depression interferes with your basic ability to function in life. It upsets your relationships, sleep, eating, and work habits. CAUSES  Depression is believed to be caused by an imbalance in brain chemicals. It may be triggered by an unpleasant event. Relationship crises, a death in the family, financial worries, retirement, or other stressors are normal causes of depression. Depression may also start for no known reason. Other factors that may play a part include medical illnesses, some medicines, genetics, and alcohol or drug abuse. SYMPTOMS   Feeling unhappy or worthless.   Long-lasting (chronic) tiredness or worn-out feeling.   Self-destructive thoughts and actions.   Not being able to sleep or sleeping too much.   Eating more than usual or not eating at all.   Headaches or feeling anxious.   Trouble concentrating or making decisions.   Unexplained physical problems and substance abuse.  TREATMENT  Depression usually gets better with treatment. This can include:  Antidepressant medicines. It can take weeks before the proper dose is achieved and benefits are reached.   Talking with a therapist, clergyperson, counselor, or friend. These people can help you gain insight into your problem and regain control of your life.   Eating a good diet.   Getting regular physical exercise, such as walking for 30 minutes every day.   Not abusing alcohol or drugs.  Treating depression often takes 6 months or longer. This length of treatment is needed to keep symptoms from returning. Call your caregiver and arrange for follow-up care as suggested. SEEK  IMMEDIATE MEDICAL CARE IF:   You start to have thoughts of hurting yourself or others.   Call your local emergency services (911 in [place]   Go to your local medical emergency department.   Call the National Suicide Prevention Lifeline: 1-800-273-TALK 570-681-5670).  Document Released: 03/22/2005 Document Revised: 03/11/2011 Document Reviewed: 08/22/2009 [facility] Patient Information 2012 [place], [place].         Diabetes, Frequently Asked Questions WHAT IS DIABETES? Most of the food we eat is turned into glucose (sugar). Our bodies use it for energy. The pancreas makes a hormone called insulin. It helps glucose get into the cells of our bodies. When you have diabetes, your body either does not make enough insulin or cannot use its own insulin as well as it should. This causes sugars to build up in your blood. WHAT ARE THE SYMPTOMS OF DIABETES?  Frequent urination.   Excessive thirst.   Unexplained weight loss.   Extreme hunger.   Blurred vision.   Tingling or numbness in hands or feet.   Feeling very tired much of the time.   Dry, itchy skin.   Sores that are slow to heal.   Yeast infections.  WHAT ARE THE TYPES OF DIABETES? Type 1 Diabetes   About 10% of affected people have this type.   Usually occurs before the age of 66.   Usually occurs in thin to normal weight people.  Type 2 Diabetes  About 90% of affected people have this type.   Usually occurs after the age of 5.   Usually occurs in overweight people.   More likely to have:  A family history of diabetes.   A history of diabetes during pregnancy (gestational diabetes).   High blood pressure.   High cholesterol and triglycerides.  Gestational Diabetes  Occurs in about 4% of pregnancies.   Usually goes away after the baby is born.   More likely to occur in women with:   Family history of diabetes.   Previous gestational diabetes.   Obese.   Over 74 years old.  WHAT IS  PRE-DIABETES? Pre-diabetes means your blood glucose is higher than normal, but lower than the diabetes range. It also means you are at risk of getting type 2 diabetes and heart disease. If you are told you have pre-diabetes, have your blood glucose checked again in 1 to 2 years. WHAT IS THE TREATMENT FOR DIABETES? Treatment is aimed at keeping blood glucose near normal levels at all times. Learning how to manage this yourself is important in treating diabetes. Depending on the type of diabetes you have, your treatment will include one or more of the following:  Monitoring your blood glucose.   Meal planning.   Exercise.   Oral medicine (pills) or insulin.  CAN DIABETES BE PREVENTED? With type 1 diabetes, prevention is more difficult, because the triggers that cause it are not yet known. With type 2 diabetes, prevention is more likely, with lifestyle changes:  Maintain a healthy weight.   Eat healthy.   Exercise.  IS THERE A CURE FOR DIABETES? No, there is no cure for diabetes. There is a lot of research going on that is looking for a cure, and progress is being made. Diabetes can be treated and controlled. People with diabetes can manage their diabetes and lead normal, active lives. SHOULD I BE TESTED FOR DIABETES? If you are at least 43 years old, you should be tested for diabetes. You should be tested again every 3 years. If you are 45 or older and overweight, you may want to get tested more often. If you are younger than 45, overweight, and have one or more of the following risk factors, you should be tested:  Family history of diabetes.   Inactive lifestyle.   High blood pressure.  WHAT ARE SOME OTHER SOURCES FOR INFORMATION ON DIABETES? The following organizations may help in your search for more information on diabetes: National Diabetes Education Program (NDEP) Internet: SolarDiscussions.es American Diabetes Association Internet: http://www.diabetes.org    Juvenile Diabetes Foundation International Internet: [employer]lessW[employer].is Document Released: 03/25/2003 Document Revised: 03/11/2011 Document Reviewed: 01/17/2009 [facility] Patient Information 2012 [place], [place].

## 2011-07-06 NOTE — ED Notes (Signed)
Pt. Belongings placed in psych ed locker 40

## 2011-07-06 NOTE — ED Notes (Signed)
Pt has been wanded and searched
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
## 2011-07-07 NOTE — ED Provider Notes (Signed)
Medical screening examination/treatment/procedure(s) were performed by non-physician practitioner and as supervising physician I was immediately available for consultation/collaboration.  Maurisio Ruddy, MD 07/07/11 2130 

## 2011-09-05 ENCOUNTER — Emergency Department: Payer: Self-pay | Admitting: Emergency Medicine

## 2011-09-26 ENCOUNTER — Emergency Department (HOSPITAL_COMMUNITY)
Admission: EM | Admit: 2011-09-26 | Discharge: 2011-09-27 | Disposition: A | Discharge status: Home / Self Care | Payer: Medicaid Other | Attending: Emergency Medicine | Admitting: Emergency Medicine

## 2011-09-26 ENCOUNTER — Encounter (HOSPITAL_COMMUNITY): Payer: Self-pay | Admitting: *Deleted

## 2011-09-26 DIAGNOSIS — F411 Generalized anxiety disorder: Secondary | ICD-10-CM | POA: Insufficient documentation

## 2011-09-26 DIAGNOSIS — F3289 Other specified depressive episodes: Secondary | ICD-10-CM | POA: Insufficient documentation

## 2011-09-26 DIAGNOSIS — R45851 Suicidal ideations: Secondary | ICD-10-CM | POA: Insufficient documentation

## 2011-09-26 DIAGNOSIS — E119 Type 2 diabetes mellitus without complications: Secondary | ICD-10-CM | POA: Insufficient documentation

## 2011-09-26 DIAGNOSIS — F17200 Nicotine dependence, unspecified, uncomplicated: Secondary | ICD-10-CM | POA: Insufficient documentation

## 2011-09-26 DIAGNOSIS — F419 Anxiety disorder, unspecified: Secondary | ICD-10-CM

## 2011-09-26 DIAGNOSIS — F329 Major depressive disorder, single episode, unspecified: Secondary | ICD-10-CM | POA: Insufficient documentation

## 2011-09-26 LAB — ETHANOL: Alcohol, Ethyl (B): 30 mg/dL — ABNORMAL HIGH (ref 0–11)

## 2011-09-26 LAB — GLUCOSE, CAPILLARY: Glucose-Capillary: 152 mg/dL — ABNORMAL HIGH (ref 70–99)

## 2011-09-26 LAB — RAPID URINE DRUG SCREEN, HOSP PERFORMED
Amphetamines: NOT DETECTED
Barbiturates: NOT DETECTED
Benzodiazepines: NOT DETECTED
Tetrahydrocannabinol: NOT DETECTED

## 2011-09-26 LAB — CBC
HCT: 47.5 % (ref 39.0–52.0)
Hemoglobin: 16.4 g/dL (ref 13.0–17.0)
MCV: 90.8 fL (ref 78.0–100.0)
RBC: 5.23 MIL/uL (ref 4.22–5.81)
RDW: 13.9 % (ref 11.5–15.5)
WBC: 6.6 10*3/uL (ref 4.0–10.5)

## 2011-09-26 LAB — BASIC METABOLIC PANEL
BUN: 9 mg/dL (ref 6–23)
CO2: 25 mEq/L (ref 19–32)
Chloride: 105 mEq/L (ref 96–112)
Creatinine, Ser: 0.91 mg/dL (ref 0.50–1.35)
Glucose, Bld: 264 mg/dL — ABNORMAL HIGH (ref 70–99)
Potassium: 3.8 mEq/L (ref 3.5–5.1)

## 2011-09-26 MED ORDER — GABAPENTIN 300 MG PO CAPS
300.0000 mg | ORAL_CAPSULE | Freq: Three times a day (TID) | ORAL | Status: DC
  Administered 2011-09-26 – 2011-09-27 (×4): 300 mg via ORAL
  Filled 2011-09-26 (×6): qty 1

## 2011-09-26 MED ORDER — ACETAMINOPHEN 325 MG PO TABS: 650.0000 mg | ORAL_TABLET | ORAL | Status: DC | PRN

## 2011-09-26 MED ORDER — ALUM & MAG HYDROXIDE-SIMETH 200-200-20 MG/5ML PO SUSP: 30.0000 mL | ORAL | Status: DC | PRN

## 2011-09-26 MED ORDER — ONDANSETRON HCL 4 MG PO TABS: 4.0000 mg | ORAL_TABLET | Freq: Three times a day (TID) | ORAL | Status: DC | PRN

## 2011-09-26 MED ORDER — LORAZEPAM 1 MG PO TABS: 1.0000 mg | ORAL_TABLET | Freq: Four times a day (QID) | ORAL | Status: DC | PRN

## 2011-09-26 MED ORDER — FOLIC ACID 1 MG PO TABS
1.0000 mg | ORAL_TABLET | Freq: Every day | ORAL | Status: DC
  Administered 2011-09-26 – 2011-09-27 (×2): 1 mg via ORAL
  Filled 2011-09-26 (×2): qty 1

## 2011-09-26 MED ORDER — ADULT MULTIVITAMIN W/MINERALS CH
1.0000 | ORAL_TABLET | Freq: Every day | ORAL | Status: DC
  Administered 2011-09-26 – 2011-09-27 (×2): 1 via ORAL
  Filled 2011-09-26 (×2): qty 1

## 2011-09-26 MED ORDER — LORAZEPAM 2 MG/ML IJ SOLN: 1.0000 mg | Freq: Four times a day (QID) | INTRAMUSCULAR | Status: DC | PRN

## 2011-09-26 MED ORDER — ZOLPIDEM TARTRATE 5 MG PO TABS: 5.0000 mg | ORAL_TABLET | Freq: Every evening | ORAL | Status: DC | PRN

## 2011-09-26 MED ORDER — VITAMIN B-1 100 MG PO TABS
100.0000 mg | ORAL_TABLET | Freq: Every day | ORAL | Status: DC
  Administered 2011-09-26 – 2011-09-27 (×2): 100 mg via ORAL
  Filled 2011-09-26 (×2): qty 1

## 2011-09-26 MED ORDER — NICOTINE 21 MG/24HR TD PT24
21.0000 mg | MEDICATED_PATCH | Freq: Every day | TRANSDERMAL | Status: DC
  Administered 2011-09-26 – 2011-09-27 (×2): 21 mg via TRANSDERMAL
  Filled 2011-09-26 (×2): qty 1

## 2011-09-26 MED ORDER — TRAZODONE HCL 100 MG PO TABS
100.0000 mg | ORAL_TABLET | Freq: Every day | ORAL | Status: DC
  Administered 2011-09-26: 100 mg via ORAL
  Filled 2011-09-26: qty 1

## 2011-09-26 MED ORDER — INSULIN ASPART 100 UNIT/ML ~~LOC~~ SOLN
0.0000 [IU] | Freq: Three times a day (TID) | SUBCUTANEOUS | Status: DC
Start: 2011-09-26 — End: 2011-09-27
  Administered 2011-09-26: 8 [IU] via SUBCUTANEOUS
  Administered 2011-09-26: 2 [IU] via SUBCUTANEOUS
  Administered 2011-09-26 – 2011-09-27 (×3): 3 [IU] via SUBCUTANEOUS
  Filled 2011-09-26: qty 1
  Filled 2011-09-26: qty 2
  Filled 2011-09-26: qty 1
  Filled 2011-09-26: qty 3

## 2011-09-26 MED ORDER — INSULIN ASPART 100 UNIT/ML ~~LOC~~ SOLN: 0.0000 [IU] | Freq: Every day | SUBCUTANEOUS | Status: DC

## 2011-09-26 MED ORDER — THIAMINE HCL 100 MG/ML IJ SOLN: 100.0000 mg | Freq: Every day | INTRAMUSCULAR | Status: DC

## 2011-09-26 MED ORDER — IBUPROFEN 200 MG PO TABS: 600.0000 mg | ORAL_TABLET | Freq: Three times a day (TID) | ORAL | Status: DC | PRN

## 2011-09-26 MED ORDER — GLIPIZIDE ER 5 MG PO TB24
5.0000 mg | ORAL_TABLET | Freq: Every day | ORAL | Status: DC
  Administered 2011-09-26 – 2011-09-27 (×2): 5 mg via ORAL
  Filled 2011-09-26 (×2): qty 1

## 2011-09-26 NOTE — ED Notes (Signed)
Pt is very tearful, he states he will end his life,  That nobody wants him around,  He states he has tried and he can't do it anymore,  'says he has been in [place] before", his plan is to blow his brains out.

## 2011-09-26 NOTE — ED Notes (Signed)
Lab bedside to obtain urine samples

## 2011-09-26 NOTE — ED Notes (Signed)
MD at bedside. 

## 2011-09-26 NOTE — ED Notes (Signed)
First Examination document completed and signed by MD.
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
## 2011-09-27 LAB — GLUCOSE, CAPILLARY: Glucose-Capillary: 167 mg/dL — ABNORMAL HIGH (ref 70–99)

## 2011-09-27 NOTE — ED Notes (Signed)
Pt's belongings in psych consultation room

## 2011-09-27 NOTE — Discharge Instructions (Signed)
Use referrals provided by the behavioral health team, and follow up with [facility] in the next couple days - see resource guide. Your blood pressure and blood sugar are elevated  - follow up with primary care doctor in coming week.  Return to ER if worse, severe depression, severe anxiety, thoughts of harm to self or others, other concern.        RESOURCE GUIDE  Chronic Pain Problems: Contact [facility]  [phone] Patients need to be referred by their primary care doctor.  Insufficient Money for Medicine: Contact United Way:  call "211" or [facility] [phone].  No Primary Care Doctor: - Call [facility]  [phone] - can help you locate a primary care doctor that  accepts your insurance, provides certain services, etc. - Physician Referral Service- [phone]  Agencies that provide inexpensive medical care: - [facility]  [phone] - [facility]  [phone] - [facility]  [phone] - Women's Clinic  [phone] - Planned Parenthood  [phone] [facility]  [phone]  Medicaid-accepting [facility] Providers: - [facility]- [address]  [phone], Mon-Fri 9am-7pm, Sat 9am-1pm - [facility]- [address] [place], Suite [place]  [phone] - [facility]- [address], [address] [place]  [phone] [facility]- [address]  [phone] - Renaye Rakers- [address] [place], [address], [phone]  Only accepts [place] patients after they have their name  applied to their card  Self Pay (no insurance) in [place]: - Sickle Cell Patients: Dr Willey Blade, [facility]  [address] [place], [phone] - [facility]- [address] [place]  [phone]       [facility] [place]- [address]       -     [facility]-  see information above (Speak to [employer] if you do not have insurance)       -  [facility]- [address] [place], [phone]       -  [facility]- [address],  [phone]       -  [facility]- [address] [phone]       -  Dr Julio Sicks-  [address], [place], [phone]       -  [facility]- [address], [phone]       -  [facility]- [address] [phone], also [address] [phone]       -    [facility]- [address] [place], [phone], 1st & 3rd Saturday   every month, 10am-1pm  1) Find a Doctor and Pay Out of Pocket Although you won't have to find out who is covered by your insurance plan, it is a good idea to ask around and get recommendations. You will then need to call the office and see if the doctor you have chosen will accept you as a new patient and what types of options they offer for patients who are self-pay. Some doctors offer discounts or will set up payment plans for their patients who do not have insurance, but you will need to ask so you aren't surprised when you get to your appointment.  2) Contact  Your Local Health Department Not all health departments have doctors that can see patients for sick visits, but many do, so it is worth a call to see if yours does. If you don't know where your local health department is, you can check in your phone book. The CDC also has a tool to help you locate your state's health department, and many state websites also have listings of all of their local health departments.  3) Find a Walk-in Clinic If your illness is not likely to be very severe or complicated, you may want to try a walk in clinic. These are popping up all over the country in pharmacies, drugstores, and shopping centers. They're usually staffed by nurse practitioners or physician assistants that have been trained to treat common illnesses and complaints. They're usually  fairly quick and inexpensive. However, if you have serious medical issues or chronic medical problems, these are probably not your best option  STD Testing - [facility] Department of [facility] [place], STD Clinic, [address], [place], phone [phone] or [phone].  Monday - Friday, call for an appointment. [facility] Department of [employer], STD Clinic, [address], [place], phone [phone] or [phone].  Monday - Friday, call for an appointment.  Abuse/Neglect: [facility] Child Abuse Hotline (507)028-4665 [facility] Child Abuse Hotline (831)578-4960 (After Hours)  Emergency Shelter:  [facility] (587)530-1199  Maternity Homes: - [facility] [place] [facility] (254) [phone] - [facility] (812)732-2658  MRSA Hotline #:   4130608836  [facility] Resources  [facility]  [employer] [facility] Dept. [address]                 [address]         [address] [place] [address]  Blondell Reveal Phone:  [phone]                                  Phone:  [phone]                   Phone:  [phone]  [facility], [phone] - [phone]       -     [facility] in [place], [address],                                  [phone], [facility] Child Abuse Hotline 3010413567 or (539) [phone] (After Hours)   Behavioral Health Services  Substance Abuse Resources: - [facility]  [phone] - [facility] [phone] - [facility] [phone] Floydene Flock [phone] - Residential & Outpatient Substance Abuse Program  [phone]  Psychological Services: [facility]   [phone] - [facility]  [phone] - [facility], [phone] [place]. [address], [place], ACCESS LINE: (951)416-8329 or 4401549446, [email or web address]  Dental Assistance  If unable to pay or uninsured, contact:  [facility] or [facility]. to become qualified for the adult dental clinic.  Patients with Medicaid: [facility] [phone] [address], [phone] [address], [phone]  If unable to pay, or uninsured, contact [facility] [phone]) or [facility] Department [phone] in [place], [phone] in [facility]) to become qualified for the adult dental clinic  Other Low-Cost Community Dental Services: - [facility]- [address] [place], [place], [place], [address], [phone], [phone], 2nd and 4th Thursday of the month at 6:30am.  10 clients each day by appointment, can sometimes see walk-in patients if someone does not show for an appointment. [facility]- [address] [place], [place], [address], [phone] - [facility]- [address] [place], [place], [address], [phone] [facility] Health Department- [phone] [facility] Health Department- [phone] [facility] [phone]            Depression, Adolescent and Adult Depression is a true and treatable medical condition. In general there are two kinds of depression:  Depression we all experience in some form. For example depression from the death of a loved one, financial distress or natural disasters will trigger or increase depression.   Clinical depression, on the other hand, appears without an apparent cause or reason. This depression is a disease. Depression may be caused by chemical imbalance in the body and brain or may come as a response to a physical illness. Alcohol and other drugs can cause depression.  DIAGNOSIS  The diagnosis of  depression is usually based upon symptoms and medical history. TREATMENT  Treatments for depression fall into three categories. These are:  Drug therapy. There are many medicines that treat depression. Responses may vary and sometimes trial and error is necessary to determine the best medicines and dosage for a particular patient.   Psychotherapy, also called talking treatments, helps people resolve their problems by looking at them from a different point of view and by giving people insight into their own personal makeup. Traditional psychotherapy looks at a childhood source of a problem. Other psychotherapy will look at current conflicts and move toward solving those. If the cause of depression is drug use, counseling is available to help abstain. In time the depression will usually improve. If there were underlying causes for the chemical use, they can be addressed.   ECT (electroconvulsive therapy) or shock treatment is not as commonly used today. It is a very effective treatment for severe suicidal depression. During ECT electrical impulses are applied to the head. These impulses cause a generalized seizure. It can be effective but causes a loss of memory for recent events. Sometimes this loss of memory may include the last several months.  Treat all depression or suicide threats as serious. Obtain professional help. Do not wait to see if serious depression will get better over time without help. Seek help for yourself or those around you. In the [place] the number to the National Suicide Help Lines With 24 Hour Help Are: 1-800-SUICIDE 367 482 1643 Document Released: 03/19/2000 Document Revised: 03/11/2011 Document Reviewed: 11/08/2007 [facility] Patient Information 2012 [place], [place].      Anxiety and Panic Attacks Your caregiver has informed you that you are having an anxiety or panic attack. There may be many forms  of this. Most of the time these attacks come suddenly and without warning.  They come at any time of day, including periods of sleep, and at any time of life. They may be strong and unexplained. Although panic attacks are very scary, they are physically harmless. Sometimes the cause of your anxiety is not known. Anxiety is a protective mechanism of the body in its fight or flight mechanism. Most of these perceived danger situations are actually nonphysical situations (such as anxiety over losing a job). CAUSES  The causes of an anxiety or panic attack are many. Panic attacks may occur in otherwise healthy people given a certain set of circumstances. There may be a genetic cause for panic attacks. Some medications may also have anxiety as a side effect. SYMPTOMS  Some of the most common feelings are:  Intense terror.   Dizziness, feeling faint.   Hot and cold flashes.   Fear of going crazy.   Feelings that nothing is real.   Sweating.   Shaking.   Chest pain or a fast heartbeat (palpitations).   Smothering, choking sensations.   Feelings of impending doom and that death is near.   Tingling of extremities, this may be from over-breathing.   Altered reality (derealization).   Being detached from yourself (depersonalization).  Several symptoms can be present to make up anxiety or panic attacks. DIAGNOSIS  The evaluation by your caregiver will depend on the type of symptoms you are experiencing. The diagnosis of anxiety or panic attack is made when no physical illness can be determined to be a cause of the symptoms. TREATMENT  Treatment to prevent anxiety and panic attacks may include:  Avoidance of circumstances that cause anxiety.   Reassurance and relaxation.   Regular exercise.   Relaxation therapies, such as yoga.   Psychotherapy with a psychiatrist or therapist.   Avoidance of caffeine, alcohol and illegal drugs.   Prescribed medication.  SEEK IMMEDIATE MEDICAL CARE IF:   You experience panic attack symptoms that are different than your  usual symptoms.   You have any worsening or concerning symptoms.  Document Released: 03/22/2005 Document Revised: 03/11/2011 Document Reviewed: 07/24/2009 [facility] Patient Information 2012 [place], [place].Anxiety and Panic Attacks Your caregiver has informed you that you are having an anxiety or panic attack. There may be many forms of this. Most of the time these attacks come suddenly and without warning. They come at any time of day, including periods of sleep, and at any time of life. They may be strong and unexplained. Although panic attacks are very scary, they are physically harmless. Sometimes the cause of your anxiety is not known. Anxiety is a protective mechanism of the body in its fight or flight mechanism. Most of these perceived danger situations are actually nonphysical situations (such as anxiety over losing a job). CAUSES  The causes of an anxiety or panic attack are many. Panic attacks may occur in otherwise healthy people given a certain set of circumstances. There may be a genetic cause for panic attacks. Some medications may also have anxiety as a side effect. SYMPTOMS  Some of the most common feelings are:  Intense terror.   Dizziness, feeling faint.   Hot and cold flashes.   Fear of going crazy.   Feelings that nothing is real.   Sweating.   Shaking.   Chest pain or a fast heartbeat (palpitations).   Smothering, choking sensations.   Feelings of impending doom and that death is near.   Tingling of extremities,  this may be from over-breathing.   Altered reality (derealization).   Being detached from yourself (depersonalization).  Several symptoms can be present to make up anxiety or panic attacks. DIAGNOSIS  The evaluation by your caregiver will depend on the type of symptoms you are experiencing. The diagnosis of anxiety or panic attack is made when no physical illness can be determined to be a cause of the symptoms. TREATMENT  Treatment to prevent  anxiety and panic attacks may include:  Avoidance of circumstances that cause anxiety.   Reassurance and relaxation.   Regular exercise.   Relaxation therapies, such as yoga.   Psychotherapy with a psychiatrist or therapist.   Avoidance of caffeine, alcohol and illegal drugs.   Prescribed medication.  SEEK IMMEDIATE MEDICAL CARE IF:   You experience panic attack symptoms that are different than your usual symptoms.   You have any worsening or concerning symptoms.  Document Released: 03/22/2005 Document Revised: 03/11/2011 Document Reviewed: 07/24/2009 [facility] Patient Information 2012 [place], [place].       Alcohol Problems Most adults who drink alcohol drink in moderation (not a lot) are at low risk for developing problems related to their drinking. However, all drinkers, including low-risk drinkers, should know about the health risks connected with drinking alcohol. RECOMMENDATIONS FOR LOW-RISK DRINKING  Drink in moderation. Moderate drinking is defined as follows:   Men - no more than 2 drinks per day.   Nonpregnant women - no more than 1 drink per day.   Over age 39 - no more than 1 drink per day.  A standard drink is 12 grams of pure alcohol, which is equal to a 12 ounce bottle of beer or wine cooler, a 5 ounce glass of wine, or 1.5 ounces of distilled spirits (such as whiskey, brandy, vodka, or rum).  ABSTAIN FROM (DO NOT DRINK) ALCOHOL:  When pregnant or considering pregnancy.   When taking a medication that interacts with alcohol.   If you are alcohol dependent.   A medical condition that prohibits drinking alcohol (such as ulcer, liver disease, or heart disease).  DISCUSS WITH YOUR CAREGIVER:  If you are at risk for coronary heart disease, discuss the potential benefits and risks of alcohol use: Light to moderate drinking is associated with lower rates of coronary heart disease in certain populations (for example, men over age 70 and postmenopausal women).  Infrequent or nondrinkers are advised not to begin light to moderate drinking to reduce the risk of coronary heart disease so as to avoid creating an alcohol-related problem. Similar protective effects can likely be gained through proper diet and exercise.   Women and the elderly have smaller amounts of body water than men. As a result women and the elderly achieve a higher blood alcohol concentration after drinking the same amount of alcohol.   Exposing a fetus to alcohol can cause a broad range of birth defects referred to as Fetal Alcohol Syndrome (FAS) or Alcohol-Related Birth Defects (ARBD). Although FAS/ARBD is connected with excessive alcohol consumption during pregnancy, studies also have reported neurobehavioral problems in infants born to mothers reporting drinking an average of 1 drink per day during pregnancy.   Heavier drinking (the consumption of more than 4 drinks per occasion by men and more than 3 drinks per occasion by women) impairs learning (cognitive) and psychomotor functions and increases the risk of alcohol-related problems, including accidents and injuries.  CAGE QUESTIONS:   Have you ever felt that you should Cut down on your drinking?   Have  people Annoyed you by criticizing your drinking?   Have you ever felt bad or Guilty about your drinking?   Have you ever had a drink first thing in the morning to steady your nerves or get rid of a hangover (Eye opener)?  If you answered positively to any of these questions: You may be at risk for alcohol-related problems if alcohol consumption is:   Men: Greater than 14 drinks per week or more than 4 drinks per occasion.   Women: Greater than 7 drinks per week or more than 3 drinks per occasion.  Do you or your family have a medical history of alcohol-related problems, such as:  Blackouts.   Sexual dysfunction.   Depression.   Trauma.   Liver dysfunction.   Sleep disorders.   Hypertension.   Chronic abdominal pain.    Has your drinking ever caused you problems, such as problems with your family, problems with your work (or school) performance, or accidents/injuries?   Do you have a compulsion to drink or a preoccupation with drinking?   Do you have poor control or are you unable to stop drinking once you have started?   Do you have to drink to avoid withdrawal symptoms?   Do you have problems with withdrawal such as tremors, nausea, sweats, or mood disturbances?   Does it take more alcohol than in the past to get you high?   Do you feel a strong urge to drink?   Do you change your plans so that you can have a drink?   Do you ever drink in the morning to relieve the shakes or a hangover?  If you have answered a number of the previous questions positively, it may be time for you to talk to your caregivers, family, and friends and see if they think you have a problem. Alcoholism is a chemical dependency that keeps getting worse and will eventually destroy your health and relationships. Many alcoholics end up dead, impoverished, or in prison. This is often the end result of all chemical dependency.  Do not be discouraged if you are not ready to take action immediately.   Decisions to change behavior often involve up and down desires to change and feeling like you cannot decide.   Try to think more seriously about your drinking behavior.   Think of the reasons to quit.  WHERE TO GO FOR ADDITIONAL INFORMATION   The National Institute on Alcohol Abuse and Alcoholism (NIAAA)www.niaaa.nih.gov   [employer] on Alcoholism and Drug Dependence (NCADD)www.ncadd.org   American Society of Addiction Medicine (ASAM)www.https://anderson-johnson.com/  Document Released: 03/22/2005 Document Revised: 03/11/2011 Document Reviewed: 11/08/2007 [facility] Patient Information 2012 [place], [place].      Hypertension As your heart beats, it forces blood through your arteries. This force is your blood pressure. If the pressure is  too high, it is called hypertension (HTN) or high blood pressure. HTN is dangerous because you may have it and not know it. High blood pressure may mean that your heart has to work harder to pump blood. Your arteries may be narrow or stiff. The extra work puts you at risk for heart disease, stroke, and other problems.  Blood pressure consists of two numbers, a higher number over a lower, 110/72, for example. It is stated as "110 over 72." The ideal is below 120 for the top number (systolic) and under 80 for the bottom (diastolic). Write down your blood pressure today. You should pay close attention to your blood pressure if you have  certain conditions such as:  Heart failure.   Prior heart attack.   Diabetes   Chronic kidney disease.   Prior stroke.   Multiple risk factors for heart disease.  To see if you have HTN, your blood pressure should be measured while you are seated with your arm held at the level of the heart. It should be measured at least twice. A one-time elevated blood pressure reading (especially in the Emergency Department) does not mean that you need treatment. There may be conditions in which the blood pressure is different between your right and left arms. It is important to see your caregiver soon for a recheck. Most people have essential hypertension which means that there is not a specific cause. This type of high blood pressure may be lowered by changing lifestyle factors such as:  Stress.   Smoking.   Lack of exercise.   Excessive weight.   Drug/tobacco/alcohol use.   Eating less salt.  Most people do not have symptoms from high blood pressure until it has caused damage to the body. Effective treatment can often prevent, delay or reduce that damage. TREATMENT  When a cause has been identified, treatment for high blood pressure is directed at the cause. There are a large number of medications to treat HTN. These fall into several categories, and your caregiver  will help you select the medicines that are best for you. Medications may have side effects. You should review side effects with your caregiver. If your blood pressure stays high after you have made lifestyle changes or started on medicines,   Your medication(s) may need to be changed.   Other problems may need to be addressed.   Be certain you understand your prescriptions, and know how and when to take your medicine.   Be sure to follow up with your caregiver within the time frame advised (usually within two weeks) to have your blood pressure rechecked and to review your medications.   If you are taking more than one medicine to lower your blood pressure, make sure you know how and at what times they should be taken. Taking two medicines at the same time can result in blood pressure that is too low.  SEEK IMMEDIATE MEDICAL CARE IF:  You develop a severe headache, blurred or changing vision, or confusion.   You have unusual weakness or numbness, or a faint feeling.   You have severe chest or abdominal pain, vomiting, or breathing problems.  MAKE SURE YOU:   Understand these instructions.   Will watch your condition.   Will get help right away if you are not doing well or get worse.  Document Released: 03/22/2005 Document Revised: 03/11/2011 Document Reviewed: 11/10/2007 [facility] Patient Information 2012 [place], [place].     Hyperglycemia Hyperglycemia occurs when the glucose (sugar) in your blood is too high. Hyperglycemia can happen for many reasons, but it most often happens to people who do not know they have diabetes or are not managing their diabetes properly.  CAUSES  Whether you have diabetes or not, there are other causes of hyperglycemia. Hyperglycemia can occur when you have diabetes, but it can also occur in other situations that you might not be as aware of, such as: Diabetes  If you have diabetes and are having problems controlling your blood glucose, hyperglycemia  could occur because of some of the following reasons:   Not following your meal plan.   Not taking your diabetes medications or not taking it properly.   Exercising  less or doing less activity than you normally do.   Being sick.  Pre-diabetes  This cannot be ignored. Before people develop Type 2 diabetes, they almost always have "pre-diabetes." This is when your blood glucose levels are higher than normal, but not yet high enough to be diagnosed as diabetes. Research has shown that some long-term damage to the body, especially the heart and circulatory system, may already be occurring during pre-diabetes. If you take action to manage your blood glucose when you have pre-diabetes, you may delay or prevent Type 2 diabetes from developing.  Stress  If you have diabetes, you may be "diet" controlled or on oral medications or insulin to control your diabetes. However, you may find that your blood glucose is higher than usual in the hospital whether you have diabetes or not. This is often referred to as "stress hyperglycemia." Stress can elevate your blood glucose. This happens because of hormones put out by the body during times of stress. If stress has been the cause of your high blood glucose, it can be followed regularly by your caregiver. That way he/she can make sure your hyperglycemia does not continue to get worse or progress to diabetes.  Steroids  Steroids are medications that act on the infection fighting system (immune system) to block inflammation or infection. One side effect can be a rise in blood glucose. Most people can produce enough extra insulin to allow for this rise, but for those who cannot, steroids make blood glucose levels go even higher. It is not unusual for steroid treatments to "uncover" diabetes that is developing. It is not always possible to determine if the hyperglycemia will go away after the steroids are stopped. A special blood test called an A1c is sometimes done to  determine if your blood glucose was elevated before the steroids were started.  SYMPTOMS  Thirsty.   Frequent urination.   Dry mouth.   Blurred vision.   Tired or fatigue.   Weakness.   Sleepy.   Tingling in feet or leg.  DIAGNOSIS  Diagnosis is made by monitoring blood glucose in one or all of the following ways:  A1c test. This is a chemical found in your blood.   Fingerstick blood glucose monitoring.   Laboratory results.  TREATMENT  First, knowing the cause of the hyperglycemia is important before the hyperglycemia can be treated. Treatment may include, but is not be limited to:  Education.   Change or adjustment in medications.   Change or adjustment in meal plan.   Treatment for an illness, infection, etc.   More frequent blood glucose monitoring.   Change in exercise plan.   Decreasing or stopping steroids.   Lifestyle changes.  HOME CARE INSTRUCTIONS   Test your blood glucose as directed.   Exercise regularly. Your caregiver will give you instructions about exercise. Pre-diabetes or diabetes which comes on with stress is helped by exercising.   Eat wholesome, balanced meals. Eat often and at regular, fixed times. Your caregiver or nutritionist will give you a meal plan to guide your sugar intake.   Being at an ideal weight is important. If needed, losing as little as 10 to 15 pounds may help improve blood glucose levels.  SEEK MEDICAL CARE IF:   You have questions about medicine, activity, or diet.   You continue to have symptoms (problems such as increased thirst, urination, or weight gain).  SEEK IMMEDIATE MEDICAL CARE IF:   You are vomiting or have diarrhea.   Your  breath smells fruity.   You are breathing faster or slower.   You are very sleepy or incoherent.   You have numbness, tingling, or pain in your feet or hands.   You have chest pain.   Your symptoms get worse even though you have been following your caregiver's orders.    If you have any other questions or concerns.  Document Released: 09/15/2000 Document Revised: 03/11/2011 Document Reviewed: 11/11/2008 [facility] Patient Information 2012 [place], [place].     1800 Calorie Diabetic Diet The 1800 calorie diabetic diet is designed for eating up to 1800 calories each day. Following this diet and making healthy meal choices can help improve overall health. It controls blood glucose (sugar) levels, and it can also help lower blood pressure and cholesterol. SERVING SIZES Measuring foods and serving sizes helps to make sure you are getting the right amount of food. The list below tells how big or small some common serving sizes are:  1 oz.........4 stacked dice.   3 oz........Marland KitchenDeck of cards.   1 tsp.......Marland KitchenTip of little finger.   1 tbs......Marland KitchenMarland KitchenThumb.   2 tbs.......Marland KitchenGolf ball.    cup......Marland KitchenHalf of a fist.   1 cup.......Marland KitchenA fist.  GUIDELINES FOR CHOOSING FOODS The goal of this diet is to eat a variety of foods and limit calories to 1800 each day. This can be done by choosing foods that are low in calories and fat. The diet also suggests eating small amounts of food frequently. Doing this helps control your blood glucose levels so they do not get too high or too low. Each meal or snack may include a protein food source to help you feel more satisfied. Try to eat about the same amount of food around the same time each day. This includes weekend days, travel days, and days off work. Space your meals about 4 to 5 hours apart, and add a snack between them, if you wish.  For example, a daily food plan could include breakfast, a morning snack, lunch, dinner, and an evening snack. Healthy meals and snacks have different types of foods, including whole grains, vegetables, fruits, lean meats, poultry, fish, and dairy products. As you plan your meals, select a variety of foods. Choose from the bread and starch, vegetable, fruit, dairy, and meat/protein groups. Examples of  foods from each group are listed below with their suggested serving sizes. Use measuring cups and spoons to become familiar with what a healthy portion looks like. Bread and Starch Each serving equals 15 grams of carbohydrates.  1 slice bread.    bagel.    cup cold cereal (unsweetened).    cup hot cereal or mashed potatoes.   1 small potato (size of a computer mouse).   ? cup cooked pasta or rice.    English muffin.   1 cup broth-based soup.   3 cups of popcorn.   4 to 6 whole-wheat crackers.    cup cooked beans, peas, or corn.  Vegetables Each serving equals 5 grams of carbohydrates.   cup cooked vegetables.   1 cup raw vegetables.    cup tomato or vegetable juice.  Fruit Each serving equals 15 grams of carbohydrates.  1 small apple or orange.   1  cup watermelon or strawberries.    cup applesauce (no sugar added).   2 tbs raisins.    banana.    cup canned fruit, packed in water or in its own juice.    cup unsweetened fruit juice.  Dairy Each serving equals 12  to 15 grams of carbohydrates.  1 cup fat-free milk.   6 oz artificially sweetened yogurt or plain yogurt.   1 cup low-fat buttermilk.   1 cup soy milk.   1 cup almond milk.  Meat/Protein  1 large egg.   2 to 3 oz meat, poultry, or fish.    cup low-fat cottage cheese.   1 tbs peanut butter.   1 oz low-fat cheese.    cup tuna, packed in water.    cup tofu.  Fat  1 tsp oil.   1 tsp trans-fat-free margarine.   1 tsp butter.   1 tsp mayonnaise.   2 tbs avocado.   1 tbs salad dressing.   1 tbs cream cheese.   2 tbs sour cream.  SAMPLE 1800 CALORIE DIET PLAN Breakfast   cup unsweetened cereal (1 carb serving).   1 cup fat-free milk (1 carb serving).   1 slice whole-wheat toast (1 carb serving).    small banana (1 carb serving).   1 scrambled egg.   1 tsp trans-fat-free margarine.  Lunch  Tuna sandwich.   2 slices whole-wheat bread (2 carb  servings).    cup canned tuna in water, drained.   1 tbs reduced fat mayonnaise.   1 stalk celery, chopped.   2 slices tomato.   1 lettuce leaf.   1 cup carrot sticks.   24 to 30 seedless grapes (2 carb servings).   6 oz light yogurt (1 carb serving).  Afternoon Snack  3 graham cracker squares (1 carb serving).   1 cup fat-free milk (1 carb serving).   1 tbs peanut butter.  Dinner  3 oz salmon, broiled with 1 tsp oil.   1 cup mashed potatoes (2 carb servings) with 1 tsp trans-fat-free margarine.   1 cup fresh or frozen green beans.   1 cup steamed asparagus.   1 cup fat-free milk (1 carb serving).  Evening Snack  3 cups of air-popped popcorn (1 carb serving).   2 tbs Parmesan cheese.  Meal Plan You can use this worksheet to help you make a daily meal plan based on the 1800 calorie diabetic diet suggestions. If you are using this plan to help you control your blood glucose, you may interchange carbohydrate-containing foods (dairy, starches, and fruits). Select a variety of fresh foods of varying colors and flavors. The total amount of carbohydrate in your meals or snacks is more important than making sure you include all of the food groups every time you eat. Choose from the approximate amount of the following foods to build your day's meals:  8 Starches.   4 Vegetables.   3 Fruits.   2 Dairy.   6 to 7 oz Meat/Protein.   Up to 4 Fats.  Your dietician can use this worksheet to help you decide how many servings and which types of foods are right for you. BREAKFAST Food Group and Servings / Food Choice Starches _______________________________________________________ Dairy __________________________________________________________ Fruit ___________________________________________________________ Meat/Protein ____________________________________________________ Fat ____________________________________________________________ LUNCH Food Group and Servings /  Food Choice Starch _________________________________________________________ Meat/Protein ___________________________________________________ Vegetables _____________________________________________________ Fruit __________________________________________________________ Dairy __________________________________________________________ Fat ____________________________________________________________ Aura Fey Food Group and Servings / Food Choice Starch ________________________________________________________ Meat/Protein ___________________________________________________ Fruit __________________________________________________________ Dairy __________________________________________________________ Laural Golden Food Group and Servings / Food Choice Starches _______________________________________________________ Meat/Protein ___________________________________________________ Dairy __________________________________________________________ Vegetable ______________________________________________________ Fruit ___________________________________________________________ Fat ____________________________________________________________ Lollie Sails Food Group and Servings / Food Choice Fruit __________________________________________________________ Meat/Protein ___________________________________________________ Dairy __________________________________________________________ Starch _________________________________________________________ DAILY TOTALS Starches _________________________ Vegetables _______________________ Fruits ____________________________ Dairy ____________________________  Meat/Protein_____________________ Fats _____________________________ Document Released: 10/12/2004 Document Revised: 03/11/2011 Document Reviewed: 02/05/2011 [facility] Patient Information 2012 [place], [place].

## 2011-09-27 NOTE — ED Provider Notes (Addendum)
Pt resting comfortably. Nad. Vitals stable. Discussed w act team, awaiting placement. They will check w therapeutic alternatives to discuss progress on placement this morning.  telepsych eval/recommendations pending.   Suzi Roots, MD 09/27/11 [phone]   Psychiatrist/telepsych has evaluated pt. They have rescinded ivc, states pt is not suicidal, has no acute psychosis, and is stable for d/c home.  On recheck, pt alert, content, cooperative, smiling. Pt is eating/drinking well.  Pt has normal mood/affect. States when was intoxicated he may have said some things which he regrets saying. States is agreeable to following up as outpt w [facility], but states is having no thoughts of harming self or others.  Pt states he feels ready for discharge home, states he will follow up as outpt and will return to ER if starts feeling worse, feels very depressed, or has thoughts of harm to self or others.   Suzi Roots, MD 09/27/11 1314

## 2011-10-01 NOTE — ED Provider Notes (Signed)
Medical screening examination/treatment/procedure(s) were performed by non-physician practitioner and as supervising physician I was immediately available for consultation/collaboration.   Dayton Bailiff, MD 10/01/11 [phone]

## 2013-06-10 ENCOUNTER — Emergency Department: Payer: Self-pay | Admitting: Emergency Medicine

## 2013-06-10 LAB — URINALYSIS, COMPLETE
BACTERIA: NONE SEEN
BILIRUBIN, UR: NEGATIVE
BLOOD: NEGATIVE
KETONE: NEGATIVE
LEUKOCYTE ESTERASE: NEGATIVE
Nitrite: NEGATIVE
Ph: 7 (ref 4.5–8.0)
Specific Gravity: 1.04 (ref 1.003–1.030)
Squamous Epithelial: NONE SEEN
WBC UR: 1 /HPF (ref 0–5)

## 2013-06-10 LAB — COMPREHENSIVE METABOLIC PANEL
ALBUMIN: 3.9 g/dL (ref 3.4–5.0)
AST: 24 U/L (ref 15–37)
Alkaline Phosphatase: 127 U/L — ABNORMAL HIGH
Anion Gap: 4 — ABNORMAL LOW (ref 7–16)
BUN: 8 mg/dL (ref 7–18)
Bilirubin,Total: 0.3 mg/dL (ref 0.2–1.0)
CREATININE: 1.07 mg/dL (ref 0.60–1.30)
Calcium, Total: 9.1 mg/dL (ref 8.5–10.1)
Chloride: 102 mmol/L (ref 98–107)
Co2: 29 mmol/L (ref 21–32)
Glucose: 428 mg/dL — ABNORMAL HIGH (ref 65–99)
Osmolality: 287 (ref 275–301)
Potassium: 3.7 mmol/L (ref 3.5–5.1)
SGPT (ALT): 29 U/L (ref 12–78)
Sodium: 135 mmol/L — ABNORMAL LOW (ref 136–145)
TOTAL PROTEIN: 7.9 g/dL (ref 6.4–8.2)

## 2013-06-10 LAB — PROTIME-INR
INR: 0.9
Prothrombin Time: 12.2 secs (ref 11.5–14.7)

## 2013-06-10 LAB — CBC
HCT: 50.1 % (ref 40.0–52.0)
HGB: 16.6 g/dL (ref 13.0–18.0)
MCH: 30.5 pg (ref 26.0–34.0)
MCHC: 33.1 g/dL (ref 32.0–36.0)
MCV: 92 fL (ref 80–100)
Platelet: 166 10*3/uL (ref 150–440)
RBC: 5.44 10*6/uL (ref 4.40–5.90)
RDW: 13.9 % (ref 11.5–14.5)
WBC: 10.8 10*3/uL — AB (ref 3.8–10.6)

## 2013-07-05 ENCOUNTER — Emergency Department: Payer: Self-pay | Admitting: Emergency Medicine

## 2013-07-06 ENCOUNTER — Emergency Department: Payer: Self-pay | Admitting: Emergency Medicine

## 2013-07-09 ENCOUNTER — Emergency Department: Payer: Self-pay | Admitting: Emergency Medicine

## 2013-07-09 LAB — CBC
HCT: 47.9 % (ref 40.0–52.0)
HGB: 15.9 g/dL (ref 13.0–18.0)
MCH: 30.3 pg (ref 26.0–34.0)
MCHC: 33.1 g/dL (ref 32.0–36.0)
MCV: 92 fL (ref 80–100)
Platelet: 174 10*3/uL (ref 150–440)
RBC: 5.24 10*6/uL (ref 4.40–5.90)
RDW: 13.8 % (ref 11.5–14.5)
WBC: 14.3 10*3/uL — AB (ref 3.8–10.6)

## 2013-07-09 LAB — COMPREHENSIVE METABOLIC PANEL
ALT: 22 U/L (ref 12–78)
ANION GAP: 6 — AB (ref 7–16)
AST: 23 U/L (ref 15–37)
Albumin: 3.5 g/dL (ref 3.4–5.0)
Alkaline Phosphatase: 133 U/L — ABNORMAL HIGH
BILIRUBIN TOTAL: 0.3 mg/dL (ref 0.2–1.0)
BUN: 6 mg/dL — AB (ref 7–18)
CALCIUM: 8.9 mg/dL (ref 8.5–10.1)
CHLORIDE: 107 mmol/L (ref 98–107)
CREATININE: 0.71 mg/dL (ref 0.60–1.30)
Co2: 26 mmol/L (ref 21–32)
EGFR (African American): >60
EGFR (Non-African Amer.): >60
Glucose: 244 mg/dL — ABNORMAL HIGH (ref 65–99)
Osmolality: 283 (ref 275–301)
Potassium: 3.9 mmol/L (ref 3.5–5.1)
SODIUM: 139 mmol/L (ref 136–145)
TOTAL PROTEIN: 7.7 g/dL (ref 6.4–8.2)

## 2013-07-11 ENCOUNTER — Encounter (HOSPITAL_COMMUNITY): Payer: Self-pay | Admitting: Emergency Medicine

## 2013-07-11 ENCOUNTER — Emergency Department (HOSPITAL_COMMUNITY)
Admission: EM | Admit: 2013-07-11 | Discharge: 2013-07-11 | Disposition: A | Discharge status: Home / Self Care | Payer: Medicaid Other | Attending: Emergency Medicine | Admitting: Emergency Medicine

## 2013-07-11 DIAGNOSIS — F3289 Other specified depressive episodes: Secondary | ICD-10-CM | POA: Insufficient documentation

## 2013-07-11 DIAGNOSIS — E119 Type 2 diabetes mellitus without complications: Secondary | ICD-10-CM | POA: Insufficient documentation

## 2013-07-11 DIAGNOSIS — Z79899 Other long term (current) drug therapy: Secondary | ICD-10-CM | POA: Insufficient documentation

## 2013-07-11 DIAGNOSIS — G479 Sleep disorder, unspecified: Secondary | ICD-10-CM | POA: Insufficient documentation

## 2013-07-11 DIAGNOSIS — K648 Other hemorrhoids: Secondary | ICD-10-CM | POA: Insufficient documentation

## 2013-07-11 DIAGNOSIS — F411 Generalized anxiety disorder: Secondary | ICD-10-CM | POA: Insufficient documentation

## 2013-07-11 DIAGNOSIS — F17200 Nicotine dependence, unspecified, uncomplicated: Secondary | ICD-10-CM | POA: Insufficient documentation

## 2013-07-11 DIAGNOSIS — F329 Major depressive disorder, single episode, unspecified: Secondary | ICD-10-CM | POA: Insufficient documentation

## 2013-07-11 DIAGNOSIS — K644 Residual hemorrhoidal skin tags: Secondary | ICD-10-CM | POA: Insufficient documentation

## 2013-07-11 DIAGNOSIS — K649 Unspecified hemorrhoids: Secondary | ICD-10-CM

## 2013-07-11 LAB — URINALYSIS, ROUTINE W REFLEX MICROSCOPIC
Bilirubin Urine: NEGATIVE
Glucose, UA: >1000 mg/dL — AB
Hgb urine dipstick: NEGATIVE
Ketones, ur: NEGATIVE mg/dL
Leukocytes, UA: NEGATIVE
Nitrite: NEGATIVE
Protein, ur: NEGATIVE mg/dL
Specific Gravity, Urine: >1.046 — ABNORMAL HIGH (ref 1.005–1.030)
Urobilinogen, UA: 0.2 mg/dL (ref 0.0–1.0)
pH: 6.5 (ref 5.0–8.0)

## 2013-07-11 LAB — CBC WITH DIFFERENTIAL/PLATELET
Basophils Absolute: 0.1 10*3/uL (ref 0.0–0.1)
Basophils Relative: 0 % (ref 0–1)
Eosinophils Absolute: 0.1 10*3/uL (ref 0.0–0.7)
Eosinophils Relative: 1 % (ref 0–5)
HCT: 45 % (ref 39.0–52.0)
Hemoglobin: 15.7 g/dL (ref 13.0–17.0)
Lymphocytes Relative: 26 % (ref 12–46)
Lymphs Abs: 3.6 10*3/uL (ref 0.7–4.0)
MCH: 30.7 pg (ref 26.0–34.0)
MCHC: 34.9 g/dL (ref 30.0–36.0)
MCV: 87.9 fL (ref 78.0–100.0)
Monocytes Absolute: 0.9 10*3/uL (ref 0.1–1.0)
Monocytes Relative: 6 % (ref 3–12)
Neutro Abs: 9.1 10*3/uL — ABNORMAL HIGH (ref 1.7–7.7)
Neutrophils Relative %: 66 % (ref 43–77)
Platelets: 190 10*3/uL (ref 150–400)
RBC: 5.12 MIL/uL (ref 4.22–5.81)
RDW: 13.5 % (ref 11.5–15.5)
WBC: 13.7 10*3/uL — ABNORMAL HIGH (ref 4.0–10.5)

## 2013-07-11 LAB — BASIC METABOLIC PANEL
BUN: 6 mg/dL (ref 6–23)
CHLORIDE: 101 meq/L (ref 96–112)
CO2: 24 mEq/L (ref 19–32)
Calcium: 9.5 mg/dL (ref 8.4–10.5)
Creatinine, Ser: 0.6 mg/dL (ref 0.50–1.35)
GFR calc Af Amer: >90 mL/min (ref >90)
GFR calc non Af Amer: >90 mL/min (ref >90)
GLUCOSE: 293 mg/dL — AB (ref 70–99)
POTASSIUM: 3.6 meq/L — AB (ref 3.7–5.3)
Sodium: 138 mEq/L (ref 137–147)

## 2013-07-11 LAB — URINE MICROSCOPIC-ADD ON

## 2013-07-11 MED ORDER — HYDROCODONE-ACETAMINOPHEN 5-325 MG PO TABS: 1.0000 | ORAL_TABLET | Freq: Four times a day (QID) | ORAL | Status: DC | PRN

## 2013-07-11 MED ORDER — POLYETHYLENE GLYCOL 3350 17 GM/SCOOP PO POWD: 17.0000 g | Freq: Two times a day (BID) | ORAL | Status: DC

## 2013-07-11 MED ORDER — LIDOCAINE HCL 2 % EX GEL
Freq: Once | CUTANEOUS | Status: AC
  Administered 2013-07-11: 10 via TOPICAL
  Filled 2013-07-11: qty 10

## 2013-07-11 MED ORDER — HYDROCODONE-ACETAMINOPHEN 5-325 MG PO TABS
2.0000 | ORAL_TABLET | Freq: Once | ORAL | Status: AC
  Administered 2013-07-11: 2 via ORAL
  Filled 2013-07-11: qty 2

## 2013-07-11 NOTE — ED Notes (Signed)
Bed: WA09 Expected date:  Expected time:  Means of arrival:  Comments: EMS 

## 2013-07-11 NOTE — ED Provider Notes (Signed)
CSN: [id number]     Arrival date & time 07/11/13  1921 History   First MD Initiated Contact with Patient 07/11/13 1926     Chief Complaint  Patient presents with  . Rectal Pain  . Dysuria     (Consider location/radiation/quality/duration/timing/severity/associated sxs/prior Treatment) HPI Comments: Patient presents to the emergency department with chief complaint of rectal pain and dysuria. Patient states that he's been having rectal pain for the past 3 weeks. He has been seen and evaluated by a couple of different providers, who told the patient that he has hemorrhoids. Reportedly, he was recently seen at [facility], and had a CT abdomen performed to rule out rectal abscess. He denies fevers, chills, and diarrhea. He states that he has been constipated, after taking some pain medication about a month ago. He states that the constipation resolved with enemas which were offered to him at [facility]. He is now having normal bowel movements, but with severe pain because the hemorrhoids. He is tried preparation H. with no relief.  The history is provided by the patient. No language interpreter was used.    Past Medical History  Diagnosis Date  . Diabetes mellitus   . Sleeping difficulties   . Anxiety   . Depression    History reviewed. No pertinent past surgical history. History reviewed. No pertinent family history. History  Substance Use Topics  . Smoking status: Current Every Day Smoker  . Smokeless tobacco: Not on file  . Alcohol Use: Yes    Review of Systems  Constitutional: Negative for fever and chills.  Respiratory: Negative for shortness of breath.   Cardiovascular: Negative for chest pain.  Gastrointestinal: Negative for nausea, vomiting, diarrhea and constipation.  Genitourinary: Negative for dysuria.      Allergies  Review of patient's allergies indicates no known allergies.  Home Medications   Current Outpatient Rx  Name  Route  Sig   Dispense  Refill  . gabapentin (NEURONTIN) 300 MG capsule   Oral   Take 300 mg by mouth 3 (three) times daily.         [name] glipiZIDE (GLUCOTROL XL) 5 MG 24 hr tablet   Oral   Take 5 mg by mouth daily.           . traZODone (DESYREL) 50 MG tablet   Oral   Take 100 mg by mouth at bedtime.            BP 129/62  Pulse 83  Temp(Src) 98.6 F (37 C) (Oral)  Resp 20  Ht 6\' 4"  (1.93 m)  Wt 230 lb (104.327 kg)  BMI 28.01 kg/m2  SpO2 100% Physical Exam  Nursing note and vitals reviewed. Constitutional: He is oriented to person, place, and time. He appears well-developed and well-nourished.  HENT:  Head: Normocephalic and atraumatic.  Eyes: Conjunctivae and EOM are normal. Pupils are equal, round, and reactive to light. Right eye exhibits no discharge. Left eye exhibits no discharge. No scleral icterus.  Neck: Normal range of motion. Neck supple. No JVD present.  Cardiovascular: Normal rate, regular rhythm and normal heart sounds.  Exam reveals no gallop and no friction rub.   No murmur heard. Pulmonary/Chest: Effort normal and breath sounds normal. No respiratory distress. He has no wheezes. He has no rales. He exhibits no tenderness.  Abdominal: Soft. He exhibits no distension and no mass. There is no tenderness. There is no rebound and no guarding.  Genitourinary:  Chaperone present for exam, rectum is remarkable  for external hemorrhoid at the 1:00 position, as well as internal hemorrhoids, no evidence of abscess, no fissure  Musculoskeletal: Normal range of motion. He exhibits no edema and no tenderness.  Neurological: He is alert and oriented to person, place, and time.  Skin: Skin is warm and dry.  Psychiatric: He has a normal mood and affect. His behavior is normal. Judgment and thought content normal.    ED Course  Procedures (including critical care time) Results for orders placed during the hospital encounter of 07/11/13  CBC WITH DIFFERENTIAL      Result Value Ref  Range   WBC 13.7 (*) 4.0 - 10.5 K/uL   RBC 5.12  4.22 - 5.81 MIL/uL   Hemoglobin 15.7  13.0 - 17.0 g/dL   HCT 16.145.0  09.639.0 - 04.552.0 %   MCV 87.9  78.0 - 100.0 fL   MCH 30.7  26.0 - 34.0 pg   MCHC 34.9  30.0 - 36.0 g/dL   RDW 40.913.5  81.111.5 - 91.415.5 %   Platelets 190  150 - 400 K/uL   Neutrophils Relative % 66  43 - 77 %   Neutro Abs 9.1 (*) 1.7 - 7.7 K/uL   Lymphocytes Relative 26  12 - 46 %   Lymphs Abs 3.6  0.7 - 4.0 K/uL   Monocytes Relative 6  3 - 12 %   Monocytes Absolute 0.9  0.1 - 1.0 K/uL   Eosinophils Relative 1  0 - 5 %   Eosinophils Absolute 0.1  0.0 - 0.7 K/uL   Basophils Relative 0  0 - 1 %   Basophils Absolute 0.1  0.0 - 0.1 K/uL  BASIC METABOLIC PANEL      Result Value Ref Range   Sodium 138  137 - 147 mEq/L   Potassium 3.6 (*) 3.7 - 5.3 mEq/L   Chloride 101  96 - 112 mEq/L   CO2 24  19 - 32 mEq/L   Glucose, Bld 293 (*) 70 - 99 mg/dL   BUN 6  6 - 23 mg/dL   Creatinine, Ser 7.820.60  0.50 - 1.35 mg/dL   Calcium 9.5  8.4 - 95.610.5 mg/dL   GFR calc non Af Amer >90  >90 mL/min   GFR calc Af Amer >90  >90 mL/min  URINALYSIS, ROUTINE W REFLEX MICROSCOPIC      Result Value Ref Range   Color, Urine YELLOW  YELLOW   APPearance CLEAR  CLEAR   Specific Gravity, Urine >1.046 (*) 1.005 - 1.030   pH 6.5  5.0 - 8.0   Glucose, UA >1000 (*) NEGATIVE mg/dL   Hgb urine dipstick NEGATIVE  NEGATIVE   Bilirubin Urine NEGATIVE  NEGATIVE   Ketones, ur NEGATIVE  NEGATIVE mg/dL   Protein, ur NEGATIVE  NEGATIVE mg/dL   Urobilinogen, UA 0.2  0.0 - 1.0 mg/dL   Nitrite NEGATIVE  NEGATIVE   Leukocytes, UA NEGATIVE  NEGATIVE  URINE MICROSCOPIC-ADD ON      Result Value Ref Range   Squamous Epithelial / LPF RARE  RARE   WBC, UA 3-6  <3 WBC/hpf   RBC / HPF 3-6  <3 RBC/hpf   Bacteria, UA RARE  RARE       EKG Interpretation None      MDM   Final diagnoses:  Hemorrhoids    Patient with hemorrhoids, and rectal pain. No evidence of abscess.  Will check labs and try and get a hold of  [facility]'s records of the recent CT.  CT abdomen pelvis from 2 days ago performed at [facility], as reviewed by me. No evidence of rectal abscess. On my exam, the patient does have hemorrhoids, but no sign of abscess. Will treat the patient with some pain medicine, but have encouraged him to continue with the stool softeners, his pain medicine can have a constipating effect. Recommend GI followup this week. Patient understands and agrees with the plan. Vital signs stable. He is stable and ready for discharge.    Roxy Horseman, PA-C 07/11/13 2117

## 2013-07-11 NOTE — ED Provider Notes (Signed)
Medical screening examination/treatment/procedure(s) were performed by non-physician practitioner and as supervising physician I was immediately available for consultation/collaboration.   EKG Interpretation None        Layla MawKristen N Vivianne Carles, DO 07/11/13 2352

## 2013-07-11 NOTE — Discharge Instructions (Signed)
Hemorrhoids °Hemorrhoids are swollen veins around the rectum or anus. There are two types of hemorrhoids:  °· Internal hemorrhoids. These occur in the veins just inside the rectum. They may poke through to the outside and become irritated and painful. °· External hemorrhoids. These occur in the veins outside the anus and can be felt as a painful swelling or hard lump near the anus. °CAUSES °· Pregnancy.   °· Obesity.   °· Constipation or diarrhea.   °· Straining to have a bowel movement.   °· Sitting for long periods on the toilet. °· Heavy lifting or other activity that caused you to strain. °· Anal intercourse. °SYMPTOMS  °· Pain.   °· Anal itching or irritation.   °· Rectal bleeding.   °· Fecal leakage.   °· Anal swelling.   °· One or more lumps around the anus.   °DIAGNOSIS  °Your caregiver may be able to diagnose hemorrhoids by visual examination. Other examinations or tests that may be performed include:  °· Examination of the rectal area with a gloved hand (digital rectal exam).   °· Examination of anal canal using a small tube (scope).   °· A blood test if you have lost a significant amount of blood. °· A test to look inside the colon (sigmoidoscopy or colonoscopy). °TREATMENT °Most hemorrhoids can be treated at home. However, if symptoms do not seem to be getting better or if you have a lot of rectal bleeding, your caregiver may perform a procedure to help make the hemorrhoids get smaller or remove them completely. Possible treatments include:  °· Placing a rubber band at the base of the hemorrhoid to cut off the circulation (rubber band ligation).   °· Injecting a chemical to shrink the hemorrhoid (sclerotherapy).   °· Using a tool to burn the hemorrhoid (infrared light therapy).   °· Surgically removing the hemorrhoid (hemorrhoidectomy).   °· Stapling the hemorrhoid to block blood flow to the tissue (hemorrhoid stapling).   °HOME CARE INSTRUCTIONS  °· Eat foods with fiber, such as whole grains, beans,  nuts, fruits, and vegetables. Ask your doctor about taking products with added fiber in them (fiber supplements). °· Increase fluid intake. Drink enough water and fluids to keep your urine clear or pale yellow.   °· Exercise regularly.   °· Go to the bathroom when you have the urge to have a bowel movement. Do not wait.   °· Avoid straining to have bowel movements.   °· Keep the anal area dry and clean. Use wet toilet paper or moist towelettes after a bowel movement.   °· Medicated creams and suppositories may be used or applied as directed.   °· Only take over-the-counter or prescription medicines as directed by your caregiver.   °· Take warm sitz baths for 15 20 minutes, 3 4 times a day to ease pain and discomfort.   °· Place ice packs on the hemorrhoids if they are tender and swollen. Using ice packs between sitz baths may be helpful.   °· Put ice in a plastic bag.   °· Place a towel between your skin and the bag.   °· Leave the ice on for 15 20 minutes, 3 4 times a day.   °· Do not use a donut-shaped pillow or sit on the toilet for long periods. This increases blood pooling and pain.   °SEEK MEDICAL CARE IF: °· You have increasing pain and swelling that is not controlled by treatment or medicine. °· You have uncontrolled bleeding. °· You have difficulty or you are unable to have a bowel movement. °· You have pain or inflammation outside the area of the hemorrhoids. °MAKE SURE YOU: °·   Understand these instructions.  Will watch your condition.  Will get help right away if you are not doing well or get worse. Document Released: 03/19/2000 Document Revised: 03/08/2012 Document Reviewed: 01/25/2012 [facility] Patient Information 2014 [place], [place].  Hemorrhoid Banding Hemorrhoids are veins in the anus and lower rectum that become enlarged. The most common symptoms are rectal bleeding, itching, and sometimes pain. Hemorrhoids might come out with straining or having a bowel movement, and they can sometimes be  pushed back in. There are internal and external hemorrhoids. Only internal hemorrhoids can be treated with banding. In this procedure, a rubber band is placed near the hemorrhoid tissue, cutting off the blood supply. This procedure prevents the hemorrhoids from slipping down. LET YOUR CAREGIVER KNOW ABOUT: All medicines you are taking, especially blood thinners such as aspirin and coumadin.  RISKS AND COMPLICATIONS This is not a painful procedure, but if you do have intense pain immediately let your surgeon know because the band may need to be removed. You may have some mild pain or discomfort in the first 2 days or so after treatment. Sometimes there may be delayed bleeding in the first week after treatment.  BEFORE THE PROCEDURE  There is no special preparation needed before banding. Your surgeon may have you do an enema prior to the procedure. You will go home the same day.  HOME CARE INSTRUCTIONS   Your surgeon might instruct you to do sitz baths as needed if you have discomfort or after a bowel movement.  You may be instructed to use fiber supplements. SEEK MEDICAL CARE IF:  You have an increase in pain.  Your pain does not get better. SEEK IMMEDIATE MEDICAL CARE IF:  You have intense pain.  Fever greater than 100.5 F (38.1 C).  Bleeding that does not stop, or pus from the anus. Document Released: 01/17/2009 Document Revised: 06/14/2011 Document Reviewed: 01/17/2009 [facility] Patient Information 2014 [place], [place].  Hemorrhoidectomy Hemorrhoidectomy is surgery to remove hemorrhoids. Hemorrhoids are veins that have become swollen in the rectum. The rectum is the area from the bottom end of the intestines to the opening where bowel movements leave the body. Hemorrhoids can be uncomfortable. They can cause itching, bleeding and pain if a blood clot forms in them (thrombose). If hemorrhoids are small, surgery may not be needed. But if they cover a larger area, surgery is usually  suggested.  LET YOUR CAREGIVER KNOW ABOUT:   Any allergies.  All medications you are taking, including:  Herbs, eyedrops, over-the-counter medications and creams.  Blood thinners (anticoagulants), aspirin or other drugs that could affect blood clotting.  Use of steroids (by mouth or as creams).  Previous problems with anesthetics, including local anesthetics.  Possibility of pregnancy, if this applies.  Any history of blood clots.  Any history of bleeding or other blood problems.  Previous surgery.  Smoking history.  Other health problems. RISKS AND COMPLICATIONS All surgery carries some risk. However, hemorrhoid surgery usually goes smoothly. Possible complications could include:  Urinary retention.  Bleeding.  Infection.  A painful incision.  A reaction to the anesthesia (this is not common). BEFORE THE PROCEDURE   Stop using aspirin and non-steroidal anti-inflammatory drugs (NSAIDs) for pain relief. This includes prescription drugs and over-the-counter drugs such as ibuprofen and naproxen. Also stop taking vitamin E. If possible, do this two weeks before your surgery.  If you take blood-thinners, ask your healthcare provider when you should stop taking them.  You will probably have blood and urine tests done  several days before your surgery.  Do not eat or drink for about 8 hours before the surgery.  Arrive at least an hour before the surgery, or whenever your surgeon recommends. This will give you time to check in and fill out any needed paperwork.  Hemorrhoidectomy is often an outpatient procedure. This means you will be able to go home the same day. Sometimes, though, people stay overnight in the hospital after the procedure. Ask your surgeon what to expect. Either way, make arrangements in advance for someone to drive you home. PROCEDURE   The preparation:  You will change into a hospital gown.  You will be given an IV. A needle will be inserted in your  arm. Medication can flow directly into your body through this needle.  You might be given an enema to clear your rectum.  Once in the operating room, you will probably lie on your side or be repositioned later to lying on your stomach.  You will be given anesthesia (medication) so you will not feel anything during the surgery. The surgery often is done with local anesthesia (the area near the hemorrhoids will be numb and you will be drowsy but awake). Sometimes, general anesthesia is used (you will be asleep during the procedure).  The procedure:  There are a few different procedures for hemorrhoids. Be sure to ask you surgeon about the procedure, the risks and benefits.  Be sure to ask about what you need to do to take care of the wound, if there is one. AFTER THE PROCEDURE  You will stay in a recovery area until the anesthesia has worn off. Your blood pressure and pulse will be checked every so often.  You may feel a lot of pain in the area of the rectum.  Take all pain medication prescribed by your surgeon. Ask before taking any over-the-counter pain medicines.  Sometimes sitting in a warm bath can help relieve your pain.  To make sure you have bowel movements without straining:  You will probably need to take stool softeners (usually a pill) for a few days.  You should drink 8 to 10 glasses of water each day.  Your activity will be restricted for awhile. Ask your caregiver for a list of what you should and should not do while you recover. Document Released: 01/17/2009 Document Revised: 06/14/2011 Document Reviewed: 01/17/2009 [facility] Patient Information 2014 [place], [place].  Fiber Content in Foods Drinking plenty of fluids and consuming foods high in fiber can help with constipation. See the list below for the fiber content of some common foods. Starches and Grains / Dietary Fiber (g)  Cheerios, 1 cup / 3 g  Kellogg's Corn Flakes, 1 cup / 0.7 g  Rice Krispies, 1  cup /  0.3 g  Quaker Oat Life Cereal,  cup / 2.1 g  Oatmeal, instant (cooked),  cup / 2 g  Kellogg's Frosted Mini Wheats, 1 cup / 5.1 g  Rice, brown, long-grain (cooked), 1 cup / 3.5 g  Rice, white, long-grain (cooked), 1 cup / 0.6 g  Macaroni, cooked, enriched, 1 cup / 2.5 g Legumes / Dietary Fiber (g)  Beans, baked, canned, plain or vegetarian,  cup / 5.2 g  Beans, kidney, canned,  cup / 6.8 g  Beans, pinto, dried (cooked),  cup / 7.7 g  Beans, pinto, canned,  cup / 5.5 g Breads and Crackers / Dietary Fiber (g)  Graham crackers, plain or honey, 2 squares / 0.7 g  Saltine crackers, 3  squares / 0.3 g  Pretzels, plain, salted, 10 pieces / 1.8 g  Bread, whole-wheat, 1 slice / 1.9 g  Bread, white, 1 slice / 0.7 g  Bread, raisin, 1 slice / 1.2 g  Bagel, plain, 3 oz / 2 g  Tortilla, flour, 1 oz / 0.9 g  Tortilla, corn, 1 small / 1.5 g  Bun, hamburger or hotdog, 1 small / 0.9 g Fruits / Dietary Fiber (g)  Apple, raw with skin, 1 medium / 4.4 g  Applesauce, sweetened,  cup / 1.5 g  Banana,  medium / 1.5 g  Grapes, 10 grapes / 0.4 g  Orange, 1 small / 2.3 g  Raisin, 1.5 oz / 1.6 g  Melon, 1 cup / 1.4 g Vegetables / Dietary Fiber (g)  Green beans, canned,  cup / 1.3 g  Carrots (cooked),  cup / 2.3 g  Broccoli (cooked),  cup / 2.8 g  Peas, frozen (cooked),  cup / 4.4 g  Potatoes, mashed,  cup / 1.6 g  Lettuce, 1 cup / 0.5 g  Corn, canned,  cup / 1.6 g  Tomato,  cup / 1.1 g Document Released: 08/08/2006 Document Revised: 06/14/2011 Document Reviewed: 10/03/2006 ExitCare Patient Information 2014 [place], [place].

## 2013-07-11 NOTE — ED Notes (Signed)
Brought in by [facility] from home with c/o rectal pain and dysuria.  Per [facility], pt reported that he started having "severe rectal pain" tonight; pt denies any bleeding or hx of hemorrhoids.  Pt also c/o "burning" on urination.  [facility] further [facility]--- was discharged and diagnosed with "constipation".

## 2013-08-02 ENCOUNTER — Encounter (INDEPENDENT_AMBULATORY_CARE_PROVIDER_SITE_OTHER): Payer: Self-pay | Admitting: General Surgery

## 2013-08-02 ENCOUNTER — Ambulatory Visit (INDEPENDENT_AMBULATORY_CARE_PROVIDER_SITE_OTHER): Payer: Medicaid Other | Admitting: General Surgery

## 2013-08-02 VITALS — BP 128/74 | Temp 97.4°F | Resp 16 | Ht 76.0 in | Wt 246.6 lb

## 2013-08-02 DIAGNOSIS — K649 Unspecified hemorrhoids: Secondary | ICD-10-CM

## 2013-08-02 NOTE — Progress Notes (Signed)
Chief Complaint  Patient presents with  . eval hems    HISTORY: Steven Christensen is a 43 y.o. male who presents to the office with anal pain.  He states that he had a mass posterior that ruptured blood and pus.  There were 2 other hemorrhoids associated with this that regressed after the area drained.  He denies rectal bleeding.  This had been occurring for for the past few weeks.  He reports constipation due to narcotics prior to this.  His bowel habits are regular and his bowel movements are soft now with miralax.  His fiber intake is dietary.  He has never had a colonoscopy.       Past Medical History  Diagnosis Date  . Diabetes mellitus   . Sleeping difficulties   . Anxiety   . Depression       History reviewed. No pertinent past surgical history.      Current Outpatient Prescriptions  Medication Sig Dispense Refill  . HYDROcodone-acetaminophen (NORCO/VICODIN) 5-325 MG per tablet Take 1-2 tablets by mouth every 6 (six) hours as needed.  15 tablet  0  . insulin glargine (LANTUS) 100 UNIT/ML injection Inject 22 Units into the skin daily.      . polyethylene glycol powder (GLYCOLAX/MIRALAX) powder Take 17 g by mouth 2 (two) times daily. Until daily soft stools  OTC  255 g  0  . traZODone (DESYREL) 50 MG tablet Take 100 mg by mouth at bedtime.        . gabapentin (NEURONTIN) 300 MG capsule Take 300 mg by mouth 3 (three) times daily.       No current facility-administered medications for this visit.      No Known Allergies    History reviewed. No pertinent family history.  History   Social History  . Marital Status: Single    Spouse Name: N/A    Number of Children: N/A  . Years of Education: N/A   Social History Main Topics  . Smoking status: Current Every Day Smoker  . Smokeless tobacco: None  . Alcohol Use: Yes  . Drug Use: Yes    Special: "Crack" cocaine, Marijuana  . Sexual Activity: Yes   Other Topics Concern  . None   Social History Narrative  . None       REVIEW OF SYSTEMS - PERTINENT POSITIVES ONLY: Review of Systems - General ROS: negative for - chills, fever or weight loss Hematological and Lymphatic ROS: negative for - bleeding problems, blood clots or bruising Respiratory ROS: no cough, shortness of breath, or wheezing Cardiovascular ROS: no chest pain or dyspnea on exertion Gastrointestinal ROS: positive for - constipation Genito-Urinary ROS: no dysuria, trouble voiding, or hematuria  EXAM: Filed Vitals:   08/02/13 1023  BP: 128/74  Temp: 97.4 F (36.3 C)  Resp: 16    General appearance: alert and cooperative Resp: clear to auscultation bilaterally Cardio: regular rate and rhythm GI: soft, non-tender; bowel sounds normal; no masses,  no organomegaly Unable to do anoscopy due to pain. Findings: scarring noted in posterior midline.  No major skin tags noted, DRE deferred per pt preference    ASSESSMENT AND PLAN: Steven Christensen is a 43 y.o. M that appears to have had a thrombosed hemorrhoid that ruptured.  I have recommended that he start a fiber supplement daily and drink plenty of fluids and continue the anal cream as needed.  He can slowly decrease his Miralax.  If he develops rectal bleeding or ongoing anal pain,  he will call the office for a recheck.  Pt was counseled on colonoscopy at age 43.  He does not have any FH of colon cancer that he is aware of.      Vanita PandaAlicia C Briyan Kleven, MD Colon and Rectal Surgery / [facility]      Visit Diagnoses: 1. Hemorrhoids     Primary Care Physician: Dorrene GermanAVBUERE,EDWIN A, MD

## 2013-08-02 NOTE — Patient Instructions (Signed)
HEMORRHOIDS    Did you know... Hemorrhoids are one of the most common ailments known.  More than half the population will develop hemorrhoids, usually after age 45.  Millions of Americans currently suffer from hemorrhoids.  The average person suffers in silence for a long period before seeking medical care.  Today's treatment methods make some types of hemorrhoid removal much less painful.  What are hemorrhoids? Often described as "varicose veins of the anus and rectum", hemorrhoids are enlarged, bulging blood vessels in and about the anus and lower rectum. There are two types of hemorrhoids: external and internal, which refer to their location.  External (outside) hemorrhoids develop near the anus and are covered by very sensitive skin. These are usually painless. However, if a blood clot (thrombosis) develops in an external hemorrhoid, it becomes a painful, hard lump. The external hemorrhoid may bleed if it ruptures. Internal (inside) hemorrhoids develop within the anus beneath the lining. Painless bleeding and protrusion during bowel movements are the most common symptom. However, an internal hemorrhoid can cause severe pain if it is completely "prolapsed" - protrudes from the anal opening and cannot be pushed back inside.   What causes hemorrhoids? An exact cause is unknown; however, the upright posture of humans alone forces a great deal of pressure on the rectal veins, which sometimes causes them to bulge. Other contributing factors include:  . Aging  . Chronic constipation or diarrhea  . Pregnancy  . Heredity  . Straining during bowel movements  . Faulty bowel function due to overuse of laxatives or enemas . Spending long periods of time (e.g., reading) on the toilet  Whatever the cause, the tissues supporting the vessels stretch. As a result, the vessels dilate; their walls become thin and bleed. If the stretching and pressure continue, the weakened vessels protrude.  What are the  symptoms? If you notice any of the following, you could have hemorrhoids:  . Bleeding during bowel movements  . Protrusion during bowel movements . Itching in the anal area  . Pain  . Sensitive lump(s)  How are hemorrhoids treated? Mild symptoms can be relieved frequently by increasing the amount of fiber (e.g., fruits, vegetables, breads and cereals) and fluids in the diet. Eliminating excessive straining reduces the pressure on hemorrhoids and helps prevent them from protruding. A sitz bath - sitting in plain warm water for about 10 minutes - can also provide some relief . With these measures, the pain and swelling of most symptomatic hemorrhoids will decrease in two to seven days, and the firm lump should recede within four to six weeks. In cases of severe or persistent pain from a thrombosed hemorrhoid, your physician may elect to remove the hemorrhoid containing the clot with a small incision. Performed under local anesthesia as an outpatient, this procedure generally provides relief. Severe hemorrhoids may require special treatment, much of which can be performed on an outpatient basis.  . Ligation - the rubber band treatment - works effectively on internal hemorrhoids that protrude with bowel movements. A small rubber band is placed over the hemorrhoid, cutting off its blood supply. The hemorrhoid and the band fall off in a few days and the wound usually heals in a week or two. This procedure sometimes produces mild discomfort and bleeding and may need to be repeated for a full effect.  There is a more intense version of this procedure that is done in the OR as outpatient surgery called THD.  It involves identifying blood vessels leading to the   hemorrhoids and then tying them off with sutures.  This method is a little more painful than rubber band ligation but less painful than traditional hemorrhoidectomy and usually does not have to be repeated.  It is best for internal hemorrhoids that  bleed.  Rubber Band Ligation of Internal Hemorrhoids:  A.  Bulging, bleeding, internal hemorrhoid B.  Rubber band applied at the base of the hemorrhoid C.  About 7 days later, the banded hemorrhoid has fallen off leaving a small scar (arrow)  . Injection and Coagulation can also be used on bleeding hemorrhoids that do not protrude. Both methods are relatively painless and cause the hemorrhoid to shrivel up. . Hemorrhoidectomy - surgery to remove the hemorrhoids - is the most complete method for removal of internal and external hemorrhoids. It is necessary when (1) clots repeatedly form in external hemorrhoids; (2) ligation fails to treat internal hemorrhoids; (3) the protruding hemorrhoid cannot be reduced; or (4) there is persistent bleeding. A hemorrhoidectomy removes excessive tissue that causes the bleeding and protrusion. It is done under anesthesia using sutures, and may, depending upon circumstances, require hospitalization and a period of inactivity. Laser hemorrhoidectomies do not offer any advantage over standard operative techniques. They are also quite expensive, and contrary to popular belief, are no less painful.  Do hemorrhoids lead to cancer? No. There is no relationship between hemorrhoids and cancer. However, the symptoms of hemorrhoids, particularly bleeding, are similar to those of colorectal cancer and other diseases of the digestive system. Therefore, it is important that all symptoms are investigated by a physician specially trained in treating diseases of the colon and rectum and that everyone 50 years or older undergo screening tests for colorectal cancer. Do not rely on over-the-counter medications or other self-treatments. See a colorectal surgeon first so your symptoms can be properly evaluated and effective treatment prescribed.  2012 American Society of Colon & Rectal Surgeons    Fiber Chart  You should 25-30g of fiber per day and drinking 8 glasses of water to help  your bowels move regularly.  In the chart below you can look up how much fiber you are getting in an average day.  If you are not getting enough fiber, you should add a fiber supplement to your diet.  Examples of this include Metamucil, FiberCon and Citrucel.  These can be purchased at your local grocery store or pharmacy.      http://www.[employer].edu/offices/health/nutritioncoach/AtoZ/handouts/Fiber.pdf    GETTING TO GOOD BOWEL HEALTH. Irregular bowel habits such as constipation can lead to many problems over time.  Having one soft bowel movement a day is the most important way to prevent further problems.  The anorectal canal is designed to handle stretching and feces to safely manage our ability to get rid of solid waste (feces, poop, stool) out of our body.  BUT, hard constipated stools can act like ripping concrete bricks causing inflamed hemorrhoids, anal fissures, abdominal pain and bloating.     The goal: ONE SOFT BOWEL MOVEMENT A DAY!  To have soft, regular bowel movements:    Drink at least 8 tall glasses of water a day.     Take plenty of fiber.  Fiber is the undigested part of plant food that passes into the colon, acting s "natures broom" to encourage bowel motility and movement.  Fiber can absorb and hold large amounts of water. This results in a larger, bulkier stool, which is soft and easier to pass. Work gradually over several weeks up to 6 servings a   day of fiber (25g a day even more if needed) in the form of: o Vegetables -- Root (potatoes, carrots, turnips), leafy green (lettuce, salad greens, celery, spinach), or cooked high residue (cabbage, broccoli, etc) o Fruit -- Fresh (unpeeled skin & pulp), Dried (prunes, apricots, cherries, etc ),  or stewed ( applesauce)  o Whole grain breads, pasta, etc (whole wheat)  o Bran cereals    Bulking Agents -- This type of water-retaining fiber generally is easily obtained each day by one of the following:  o Psyllium bran -- The psyllium  plant is remarkable because its ground seeds can retain so much water. This product is available as Metamucil, Konsyl, Effersyllium, Per Diem Fiber, or the less expensive generic preparation in drug and health food stores. Although labeled a laxative, it really is not a laxative.  o Methylcellulose -- This is another fiber derived from wood which also retains water. It is available as Citrucel. o Polyethylene Glycol - and "artificial" fiber commonly called Miralax or Glycolax.  It is helpful for people with gassy or bloated feelings with regular fiber o Flax Seed - a less gassy fiber than psyllium   No reading or other relaxing activity while on the toilet. If bowel movements take longer than 5 minutes, you are too constipated.   AVOID CONSTIPATION.  High fiber and water intake usually takes care of this.  Sometimes a laxative is needed to stimulate more frequent bowel movements, but    Laxatives are not a good long-term solution as it can wear the colon out. o Osmotics (Milk of Magnesia, Fleets phosphosoda, Magnesium citrate, MiraLax, GoLytely) are safer than  o Stimulants (Senokot, Castor Oil, Dulcolax, Ex Lax)    o Do not take laxatives for more than 7days in a row.    IF SEVERELY CONSTIPATED, try a Bowel Retraining Program: o Do not use laxatives.  o Eat a diet high in roughage, such as bran cereals and leafy vegetables.  o Drink six (6) ounces of prune or apricot juice each morning.  o Eat two (2) large servings of stewed fruit each day.  o Take one (1) heaping tablespoon of a psyllium-based bulking agent twice a day. Use sugar-free sweetener when possible to avoid excessive calories.  o Eat a normal breakfast.  o Set aside 15 minutes after breakfast to sit on the toilet, but do not strain to have a bowel movement.  o If you do not have a bowel movement by the third day, use an enema and repeat the above steps.    

## 2013-10-20 ENCOUNTER — Emergency Department: Payer: Self-pay | Admitting: Emergency Medicine

## 2015-06-02 ENCOUNTER — Emergency Department
Admission: EM | Admit: 2015-06-02 | Discharge: 2015-06-03 | Disposition: A | Discharge status: Other Acute Inpatient Hospital | Payer: Medicaid Other | Attending: Student | Admitting: Student

## 2015-06-02 ENCOUNTER — Encounter: Payer: Self-pay | Admitting: *Deleted

## 2015-06-02 DIAGNOSIS — R440 Auditory hallucinations: Secondary | ICD-10-CM | POA: Diagnosis not present

## 2015-06-02 DIAGNOSIS — E119 Type 2 diabetes mellitus without complications: Secondary | ICD-10-CM | POA: Diagnosis not present

## 2015-06-02 DIAGNOSIS — F329 Major depressive disorder, single episode, unspecified: Secondary | ICD-10-CM | POA: Diagnosis not present

## 2015-06-02 DIAGNOSIS — Z794 Long term (current) use of insulin: Secondary | ICD-10-CM | POA: Diagnosis not present

## 2015-06-02 DIAGNOSIS — F17200 Nicotine dependence, unspecified, uncomplicated: Secondary | ICD-10-CM | POA: Diagnosis present

## 2015-06-02 DIAGNOSIS — R45851 Suicidal ideations: Secondary | ICD-10-CM

## 2015-06-02 DIAGNOSIS — R443 Hallucinations, unspecified: Secondary | ICD-10-CM

## 2015-06-02 DIAGNOSIS — Z79899 Other long term (current) drug therapy: Secondary | ICD-10-CM | POA: Insufficient documentation

## 2015-06-02 LAB — COMPREHENSIVE METABOLIC PANEL
ALBUMIN: 4.2 g/dL (ref 3.5–5.0)
ALT: 18 U/L (ref 17–63)
AST: 18 U/L (ref 15–41)
Alkaline Phosphatase: 90 U/L (ref 38–126)
Anion gap: 9 (ref 5–15)
BUN: 10 mg/dL (ref 6–20)
CHLORIDE: 107 mmol/L (ref 101–111)
CO2: 24 mmol/L (ref 22–32)
CREATININE: 0.83 mg/dL (ref 0.61–1.24)
Calcium: 9 mg/dL (ref 8.9–10.3)
GFR calc Af Amer: >60 mL/min (ref >60)
GFR calc non Af Amer: >60 mL/min (ref >60)
GLUCOSE: 197 mg/dL — AB (ref 65–99)
Potassium: 3.8 mmol/L (ref 3.5–5.1)
SODIUM: 140 mmol/L (ref 135–145)
Total Bilirubin: 0.2 mg/dL — ABNORMAL LOW (ref 0.3–1.2)
Total Protein: 7.4 g/dL (ref 6.5–8.1)

## 2015-06-02 LAB — URINE DRUG SCREEN, QUALITATIVE (ARMC ONLY)
Amphetamines, Ur Screen: NOT DETECTED
BARBITURATES, UR SCREEN: NOT DETECTED
Benzodiazepine, Ur Scrn: NOT DETECTED
CANNABINOID 50 NG, UR ~~LOC~~: NOT DETECTED
COCAINE METABOLITE, UR ~~LOC~~: NOT DETECTED
MDMA (ECSTASY) UR SCREEN: NOT DETECTED
Methadone Scn, Ur: NOT DETECTED
Opiate, Ur Screen: NOT DETECTED
PHENCYCLIDINE (PCP) UR S: NOT DETECTED
Tricyclic, Ur Screen: NOT DETECTED

## 2015-06-02 LAB — CBC
HCT: 48.2 % (ref 40.0–52.0)
HEMOGLOBIN: 16.1 g/dL (ref 13.0–18.0)
MCH: 30.4 pg (ref 26.0–34.0)
MCHC: 33.5 g/dL (ref 32.0–36.0)
MCV: 90.6 fL (ref 80.0–100.0)
Platelets: 171 10*3/uL (ref 150–440)
RBC: 5.32 MIL/uL (ref 4.40–5.90)
RDW: 14.3 % (ref 11.5–14.5)
WBC: 9.2 10*3/uL (ref 3.8–10.6)

## 2015-06-02 LAB — SALICYLATE LEVEL

## 2015-06-02 LAB — ETHANOL: Alcohol, Ethyl (B): 14 mg/dL — ABNORMAL HIGH (ref <5)

## 2015-06-02 LAB — ACETAMINOPHEN LEVEL: Acetaminophen (Tylenol), Serum: <10 ug/mL — ABNORMAL LOW (ref 10–30)

## 2015-06-02 MED ORDER — TRAZODONE HCL 50 MG PO TABS
50.0000 mg | ORAL_TABLET | Freq: Once | ORAL | Status: AC
  Administered 2015-06-02: 50 mg via ORAL
  Filled 2015-06-02: qty 1
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
## 2015-06-02 NOTE — ED Notes (Signed)
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
## 2015-06-02 NOTE — ED Notes (Signed)
MD at bedside. 

## 2015-06-02 NOTE — BH Assessment (Signed)
Assessment Note  Steven Christensen is an 45 y.o. male. Mr. Zorn arrived to the ED by way of friends from his church.  He reports that he has been hearing people say that he is going to die, and that they were going to kill him.  He states that it got so bad that he started to say he would kill himself.  He states that this has been happening "for a while".  He  states that he "stays depressed"  He says that "I'm past overwhelmed".  He states he hears the voices in the house and his fianc says she can't hear it  "I know she hears it".  "I'm beginning to think my house is bugged, because I hear the same things inside the house that I hear in the house".  He denied wanting to hurt himself, but admits that sometimes he feels it will solve everything if he died, but knows that if he does he will burn in hell and killing himself won't solve the problems.  He states that he came to get some help.  He reports that he had a prior diagnosed with schizophrenia and bipolar.  He states he stopped the medication in 1-2 years.  Prior visits to [facility] in [place].  He denied a problem with alcohol or drugs.BAL = 14.  UDS is negative.  Diagnosis: schizophrenia, bipolar disorder Past Medical History:  Past Medical History  Diagnosis Date  . Diabetes mellitus   . Sleeping difficulties   . Anxiety   . Depression     No past surgical history on file.  Family History: No family history on file.  Social History:  reports that he has been smoking.  He does not have any smokeless tobacco history on file. He reports that he drinks alcohol. He reports that he uses illicit drugs ("Crack" cocaine and Marijuana).  Additional Social History:  Alcohol / Drug Use History of alcohol / drug use?: No history of alcohol / drug abuse  CIWA: CIWA-Ar BP: 133/73 mmHg Pulse Rate: 76 COWS:    Allergies: No Known Allergies  Home Medications:  (Not in a hospital admission)  OB/GYN Status:  No LMP for male  patient.  General Assessment Data Location of Assessment: [facility] ED TTS Assessment: In system Is this a Tele or Face-to-Face Assessment?: Face-to-Face Is this an Initial Assessment or a Re-assessment for this encounter?: Initial Assessment Marital status: Long term relationship Maiden name: n/a Is patient pregnant?: No Pregnancy Status: No Living Arrangements: Spouse/significant other Can pt return to current living arrangement?: Yes Admission Status: Voluntary Is patient capable of signing voluntary admission?: Yes Referral Source: Self/Family/Friend Insurance type: Medicaid  Medical Screening Exam [facility] Walk-in ONLY) Medical Exam completed: Yes  Crisis Care Plan Living Arrangements: Spouse/significant other Legal Guardian: Other: (Self) Name of Psychiatrist: none at this time Name of Therapist: none at this time  Education Status Is patient currently in school?: No Current Grade: n/a Highest grade of school patient has completed: 12th Name of school: [employer] person: n/a  Risk to self with the past 6 months Suicidal Ideation: Yes-Currently Present Has patient been a risk to self within the past 6 months prior to admission? : No Suicidal Intent: No-Not Currently/Within Last 6 Months Has patient had any suicidal intent within the past 6 months prior to admission? : No Is patient at risk for suicide?: No Suicidal Plan?: No Has patient had any suicidal plan within the past 6 months prior to admission? :  No Access to Means: No What has been your use of drugs/alcohol within the last 12 months?: occassional use of alcohol - once a month Previous Attempts/Gestures: Yes How many times?: 1 Other Self Harm Risks: denied Triggers for Past Attempts: Unknown Intentional Self Injurious Behavior: None Family Suicide History: No Recent stressful life event(s):  (hallucinations) Persecutory voices/beliefs?: No Depression: Yes Depression Symptoms: Feeling  worthless/self pity Substance abuse history and/or treatment for substance abuse?: No Suicide prevention information given to non-admitted patients: Not applicable  Risk to Others within the past 6 months Homicidal Ideation: No Does patient have any lifetime risk of violence toward others beyond the six months prior to admission? : No Thoughts of Harm to Others: No Current Homicidal Intent: No Current Homicidal Plan: No Access to Homicidal Means: No Identified Victim: denied History of harm to others?: No Assessment of Violence: None Noted Violent Behavior Description: denied Does patient have access to weapons?: No Criminal Charges Pending?: No Does patient have a court date: No Is patient on probation?: No  Psychosis Hallucinations: Auditory Delusions:  (Paranoid)  Mental Status Report Appearance/Hygiene: In scrubs, Unremarkable Eye Contact: Fair Motor Activity: Unremarkable Speech: Logical/coherent Level of Consciousness: Alert Mood: Depressed Affect: Appropriate to circumstance Anxiety Level: None Thought Processes: Coherent, Tangential Judgement: Partial Orientation: Place, Person, Situation Obsessive Compulsive Thoughts/Behaviors: None  Cognitive Functioning Concentration: Normal Memory: Recent Intact IQ: Average Insight: Fair Impulse Control: Fair Appetite: Good Sleep: Decreased Vegetative Symptoms: None  ADLScreening [facility] Assessment Services) Patient's cognitive ability adequate to safely complete daily activities?: Yes Patient able to express need for assistance with ADLs?: Yes Independently performs ADLs?: Yes (appropriate for developmental age)  Prior Inpatient Therapy Prior Inpatient Therapy: Yes Prior Therapy Dates: 2007, Prior Therapy Facilty/Provider(s): [facility] (Multiple admissions) Reason for Treatment: schizophrenia, substance abuse  Prior Outpatient Therapy Prior Outpatient Therapy: Yes Prior Therapy Dates: 2015 Prior Therapy  Facilty/Provider(s): [facility] Reason for Treatment: Schizophrenia, bipolar Does patient have an ACCT team?: No Does patient have Intensive In-House Services?  : No Does patient have [facility] services? : No Does patient have [facility] services?: No  ADL Screening (condition at time of admission) Patient's cognitive ability adequate to safely complete daily activities?: Yes Patient able to express need for assistance with ADLs?: Yes Independently performs ADLs?: Yes (appropriate for developmental age)       Abuse/Neglect Assessment (Assessment to be complete while patient is alone) Physical Abuse: Denies Verbal Abuse: Denies Sexual Abuse: Denies Exploitation of patient/patient's resources: Denies Self-Neglect: Denies Values / Beliefs Cultural Requests During Hospitalization: None Spiritual Requests During Hospitalization: None   Advance Directives (For Healthcare) Does patient have an advance directive?: No    Additional Information 1:1 In Past 12 Months?: No CIRT Risk: No Elopement Risk: No Does patient have medical clearance?: Yes     Disposition:  Disposition Initial Assessment Completed for this Encounter: Yes Disposition of Patient: Other dispositions  On Site Evaluation by:   Reviewed with Physician:    Justice Deeds 06/02/2015 10:46 PM

## 2015-06-03 ENCOUNTER — Inpatient Hospital Stay
Admit: 2015-06-03 | Discharge: 2015-06-06 | DRG: 885 | Disposition: A | Discharge status: Home / Self Care | Payer: Medicaid Other | Source: Ambulatory Visit | Attending: Psychiatry | Admitting: Psychiatry

## 2015-06-03 DIAGNOSIS — F17210 Nicotine dependence, cigarettes, uncomplicated: Secondary | ICD-10-CM | POA: Diagnosis present

## 2015-06-03 DIAGNOSIS — F209 Schizophrenia, unspecified: Secondary | ICD-10-CM | POA: Diagnosis present

## 2015-06-03 DIAGNOSIS — G4700 Insomnia, unspecified: Secondary | ICD-10-CM | POA: Diagnosis present

## 2015-06-03 DIAGNOSIS — F17200 Nicotine dependence, unspecified, uncomplicated: Secondary | ICD-10-CM | POA: Diagnosis present

## 2015-06-03 DIAGNOSIS — E119 Type 2 diabetes mellitus without complications: Secondary | ICD-10-CM | POA: Diagnosis present

## 2015-06-03 DIAGNOSIS — F203 Undifferentiated schizophrenia: Secondary | ICD-10-CM | POA: Diagnosis not present

## 2015-06-03 LAB — LIPID PANEL
CHOL/HDL RATIO: 4.3 ratio
Cholesterol: 145 mg/dL (ref 0–200)
HDL: 34 mg/dL — AB (ref >40)
LDL CALC: 97 mg/dL (ref 0–99)
Triglycerides: 69 mg/dL (ref <150)
VLDL: 14 mg/dL (ref 0–40)

## 2015-06-03 LAB — HEMOGLOBIN A1C: Hgb A1c MFr Bld: 9 % — ABNORMAL HIGH (ref 4.0–6.0)

## 2015-06-03 LAB — GLUCOSE, CAPILLARY
GLUCOSE-CAPILLARY: 255 mg/dL — AB (ref 65–99)
GLUCOSE-CAPILLARY: 256 mg/dL — AB (ref 65–99)
Glucose-Capillary: 213 mg/dL — ABNORMAL HIGH (ref 65–99)
Glucose-Capillary: 139 mg/dL — ABNORMAL HIGH (ref 65–99)

## 2015-06-03 LAB — TSH: TSH: 0.684 u[IU]/mL (ref 0.350–4.500)

## 2015-06-03 MED ORDER — NICOTINE POLACRILEX 2 MG MT GUM
2.0000 mg | CHEWING_GUM | OROMUCOSAL | Status: DC | PRN
  Filled 2015-06-03: qty 1

## 2015-06-03 MED ORDER — INSULIN GLARGINE 100 UNIT/ML ~~LOC~~ SOLN
40.0000 [IU] | Freq: Every day | SUBCUTANEOUS | Status: DC
  Administered 2015-06-03 – 2015-06-06 (×4): 40 [IU] via SUBCUTANEOUS
  Filled 2015-06-03 (×6): qty 0.4

## 2015-06-03 MED ORDER — INSULIN GLARGINE 100 UNIT/ML ~~LOC~~ SOLN: 40.0000 [IU] | SUBCUTANEOUS | Status: DC

## 2015-06-03 MED ORDER — TRAZODONE HCL 100 MG PO TABS
100.0000 mg | ORAL_TABLET | Freq: Every day | ORAL | Status: DC
  Administered 2015-06-03 – 2015-06-05 (×4): 100 mg via ORAL
  Filled 2015-06-03 (×4): qty 1

## 2015-06-03 MED ORDER — NICOTINE POLACRILEX 2 MG MT GUM: 2.0000 mg | CHEWING_GUM | OROMUCOSAL | Status: DC | PRN

## 2015-06-03 MED ORDER — INSULIN ASPART 100 UNIT/ML ~~LOC~~ SOLN
0.0000 [IU] | Freq: Three times a day (TID) | SUBCUTANEOUS | Status: DC
  Administered 2015-06-03: 5 [IU] via SUBCUTANEOUS
  Administered 2015-06-04: 2 [IU] via SUBCUTANEOUS
  Administered 2015-06-04: 5 [IU] via SUBCUTANEOUS
  Administered 2015-06-04 – 2015-06-05 (×3): 3 [IU] via SUBCUTANEOUS
  Filled 2015-06-03: qty 5
  Filled 2015-06-03: qty 3
  Filled 2015-06-03: qty 2
  Filled 2015-06-03 (×2): qty 3
  Filled 2015-06-03: qty 2

## 2015-06-03 MED ORDER — ALUM & MAG HYDROXIDE-SIMETH 200-200-20 MG/5ML PO SUSP: 30.0000 mL | ORAL | Status: DC | PRN

## 2015-06-03 MED ORDER — TRAZODONE HCL 100 MG PO TABS: 100.0000 mg | ORAL_TABLET | Freq: Every day | ORAL | Status: DC

## 2015-06-03 MED ORDER — RISPERIDONE 1 MG PO TABS
2.0000 mg | ORAL_TABLET | Freq: Two times a day (BID) | ORAL | Status: DC
Start: 2015-06-03 — End: 2015-06-03
  Administered 2015-06-03 (×2): 2 mg via ORAL
  Filled 2015-06-03 (×2): qty 2

## 2015-06-03 MED ORDER — ARIPIPRAZOLE 15 MG PO TABS
15.0000 mg | ORAL_TABLET | Freq: Every day | ORAL | Status: DC
  Administered 2015-06-03 – 2015-06-05 (×3): 15 mg via ORAL
  Filled 2015-06-03 (×3): qty 1

## 2015-06-03 MED ORDER — MAGNESIUM HYDROXIDE 400 MG/5ML PO SUSP: 30.0000 mL | Freq: Every day | ORAL | Status: DC | PRN

## 2015-06-03 MED ORDER — NICOTINE 21 MG/24HR TD PT24: 21.0000 mg | MEDICATED_PATCH | Freq: Every day | TRANSDERMAL | Status: DC

## 2015-06-03 MED ORDER — INSULIN ASPART 100 UNIT/ML ~~LOC~~ SOLN
0.0000 [IU] | Freq: Every day | SUBCUTANEOUS | Status: DC
  Administered 2015-06-03: 2 [IU] via SUBCUTANEOUS
  Filled 2015-06-03 (×4): qty 2

## 2015-06-03 MED ORDER — GABAPENTIN 300 MG PO CAPS: 300.0000 mg | ORAL_CAPSULE | Freq: Three times a day (TID) | ORAL | Status: DC

## 2015-06-03 MED ORDER — ACETAMINOPHEN 325 MG PO TABS
650.0000 mg | ORAL_TABLET | Freq: Four times a day (QID) | ORAL | Status: DC | PRN
  Administered 2015-06-03 – 2015-06-06 (×4): 650 mg via ORAL
  Filled 2015-06-03 (×4): qty 2

## 2015-06-03 MED ORDER — METFORMIN HCL 500 MG PO TABS
500.0000 mg | ORAL_TABLET | Freq: Two times a day (BID) | ORAL | Status: DC
  Administered 2015-06-03 – 2015-06-06 (×8): 500 mg via ORAL
  Filled 2015-06-03 (×8): qty 1

## 2015-06-03 NOTE — BHH Suicide Risk Assessment (Signed)
[facility] Admission Suicide Risk Assessment   Nursing information obtained from:    Demographic factors:    Current Mental Status:    Loss Factors:    Historical Factors:    Risk Reduction Factors:     Total Time spent with patient: 1 hour Principal Problem: Schizophrenia (HCC) Diagnosis:   Patient Active Problem List   Diagnosis Date Noted  . Schizophrenia (HCC) [F20.9] 06/03/2015  . Diabetes (HCC) [E11.9] 06/02/2015  . Tobacco use disorder [F17.200] 06/02/2015  . Hemorrhoids [K64.9] 08/02/2013   Subjective Data:   Continued Clinical Symptoms:  Alcohol Use Disorder Identification Test Final Score (AUDIT): 18 The "Alcohol Use Disorders Identification Test", Guidelines for Use in Primary Care, Second Edition.  World Science writer [facility]). Score between 0-7:  no or low risk or alcohol related problems. Score between 8-15:  moderate risk of alcohol related problems. Score between 16-19:  high risk of alcohol related problems. Score 20 or above:  warrants further diagnostic evaluation for alcohol dependence and treatment.   CLINICAL FACTORS:   Currently Psychotic    Psychiatric Specialty Exam: ROS  Blood pressure 136/82, pulse 65, temperature 98.1 F (36.7 C), temperature source Oral, resp. rate 20, height  (1.93 m), weight 105.688 kg (233 lb).Body mass index is 28.37 kg/(m^2).                                                        COGNITIVE FEATURES THAT CONTRIBUTE TO RISK:  None    SUICIDE RISK:   Mild:  Suicidal ideation of limited frequency, intensity, duration, and specificity.  There are no identifiable plans, no associated intent, mild dysphoria and related symptoms, good self-control (both objective and subjective assessment), few other risk factors, and identifiable protective factors, including available and accessible social support.  PLAN OF CARE: admit to [facility]  I certify that inpatient services furnished can reasonably be expected  to improve the patient's condition.   Jimmy Footman, MD 06/03/2015, 4:14 PM

## 2015-06-03 NOTE — ED Notes (Signed)
Received inpatient paperwork from TTS
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
## 2015-06-03 NOTE — Progress Notes (Signed)
Recreation Therapy Notes  INPATIENT RECREATION THERAPY ASSESSMENT  Patient Details Name: Steven Christensen MRN: [id number] DOB: 06/18/1970 Today's Date: 07-02-15  Patient Stressors: Death (Fiance's father and patient's 2 aunts passed away last month)  Coping Skills:   Isolate, Avoidance, Exercise, Art/Dance, Talking, Music, Sports, Other (Comment) (Work with hands, be outside, Aeronautical engineer)  Personal Challenges: Stress Management  Leisure Interests (2+):  [employer] - Therapist, music, Individual - Other (Comment) (Go to the park)  Awareness of Community Resources:  Yes  Community Resources:  [employer], Research scientist (physical sciences)  Current Use: Yes  If no, Barriers?:    Patient Strengths:  "God made me", "I'm here"  Patient Identified Areas of Improvement:  Everything - making self look better  Current Recreation Participation:  Landscaping, exercise  Patient Goal for Hospitalization:  To improve on the things he needs to improve on  [place] of Residence:  [place] of Residence:  [place]   Current [place] (including self-harm):  No  Current HI:  No  Consent to Intern Participation: N/A   Jacquelynn Cree, LRT/CTRS 07-02-2015, 2:15 PM

## 2015-06-03 NOTE — BHH Group Notes (Signed)
BHH LCSW Group Therapy  06/03/2015 1:36 PM  Type of Therapy:  Group Therapy  Participation Level:  Minimal  Participation Quality:  Appropriate and Attentive  Affect:  Appropriate  Cognitive:  Alert, Appropriate and Oriented  Insight:  Improving  Engagement in Therapy:  Improving  Modes of Intervention:  Socialization and Support  Summary of Progress/Problems: Patient attended and participated minimally in group discussion. Patient introduced himself and shared during an ice breaker exercise his 3 things that he would take if he were stranded on a deserted [place] would be "food, weapons to hunt, and water". Patient stated he would also take a woman and did not want to go alone. Patient was attentive with minimal participation throughout group discussion.   Lulu Riding, MSW, LCSWA 06/03/2015, 1:36 PM

## 2015-06-03 NOTE — H&P (Signed)
Psychiatric Admission Assessment Adult  Patient Identification: Steven Christensen MRN:  [id number] Date of Evaluation:  06/03/2015 Chief Complaint:  Schizophrenia Principal Diagnosis: Schizophrenia (HCC) Diagnosis:   Patient Active Problem List   Diagnosis Date Noted  . Schizophrenia (HCC) [F20.9] 06/03/2015  . Diabetes (HCC) [E11.9] 06/02/2015  . Tobacco use disorder [F17.200] 06/02/2015  . Hemorrhoids [K64.9] 08/02/2013   History of Present Illness: Steven Christensen is an 45 y.o. male. Steven Christensen arrived to the ED by way of friends from his church. He reports that he has been hearing people say that he is going to die, and that they were going to kill him. He states that it got so bad that he started to say he would kill himself. He states that this has been happening "for a while". He states that he stays depressed and overwhelmed.[name] He states he hears the voices in the house and his fianc says she can't hear it; thinks his house is bugged. He denied wanting to hurt himself, but admits that sometimes he feels it will solve everything if he died, but knows that if he does he will burn in hell and killing himself won't solve the problems.  He reports that he had a prior diagnosed with schizophrenia. He states he stopped the medication in 1-2 years as he did not have transportation to make it to [employer].  Alcohol or drugs.BAL = 14. UDS is negative.  Substance abuse: smokes 1 pack/day.  Denies abusing alcohol, or any illicit drugs.  Associated Signs/Symptoms: Depression Symptoms:  depressed mood, (Hypo) Manic Symptoms:  denies Anxiety Symptoms:  denies Psychotic Symptoms:  Hallucinations: Auditory Ideas of Reference, Paranoia, PTSD Symptoms: NA Total Time spent with patient: 1 hour  Past Psychiatric History: Patient reports being diagnosed with schizophrenia back in 2003. He was admitted at the state facility at that time after he had overdosed on aspirin. He has been at the  state facility 3 times in the past his last hospitalization there was in 2007. 2 years ago the patient follow up with [facility].  He has not seen a psychiatrist or taking any psychiatric medication in more than 12 months.    Is the patient at risk to self? Yes.    Has the patient been a risk to self in the past 6 months? No.  Has the patient been a risk to self within the distant past? Yes.    Is the patient a risk to others? No.  Has the patient been a risk to others in the past 6 months? No.  Has the patient been a risk to others within the distant past? No.   Past Medical History: Patient suffers from diabetes. Patient had a seizure as a child but denies any seizures as an adult. He denies any history of trauma.  Patient says he's been compliant with Lantus Past Medical History  Diagnosis Date  . Diabetes mellitus   . Sleeping difficulties   . Anxiety   . Depression    History reviewed. No pertinent past surgical history.  Family History: Patient denies having any family history of mental illness or suicide  Social History: Patient is currently living in a trailer with his girlfriend and their 13 month old child. The patient and his fianc both received disability for mental illness.  He had no transportation and patient has not been following up with psychiatry. He denies any history of legal problems.  History  Alcohol Use  . Yes     History  Drug  Use  . Yes  . Special: "Crack" cocaine, Marijuana     Allergies:  No Known Allergies   Lab Results:  Results for orders placed or performed during the hospital encounter of 06/03/15 (from the past 48 hour(s))  Lipid panel, fasting     Status: Abnormal   Collection Time: 06/03/15  7:15 AM  Result Value Ref Range   Cholesterol 145 0 - 200 mg/dL   Triglycerides 69 <409 mg/dL   HDL 34 (L) >81 mg/dL   Total CHOL/HDL Ratio 4.3 RATIO   VLDL 14 0 - 40 mg/dL   LDL Cholesterol 97 0 - 99 mg/dL    Comment:        Total Cholesterol/HDL:CHD  Risk Coronary Heart Disease Risk Table                     Men   Women  1/2 Average Risk   3.4   3.3  Average Risk       5.0   4.4  2 X Average Risk   9.6   7.1  3 X Average Risk  23.4   11.0        Use the calculated Patient Ratio above and the CHD Risk Table to determine the patient's CHD Risk.        ATP III CLASSIFICATION (LDL):  <100     mg/dL   Optimal  191-478  mg/dL   Near or Above                    Optimal  130-159  mg/dL   Borderline  295-621  mg/dL   High  >308     mg/dL   Very High   TSH     Status: None   Collection Time: 06/03/15  7:15 AM  Result Value Ref Range   TSH 0.684 0.350 - 4.500 uIU/mL  Glucose, capillary     Status: Abnormal   Collection Time: 06/03/15  8:22 AM  Result Value Ref Range   Glucose-Capillary 255 (H) 65 - 99 mg/dL  Glucose, capillary     Status: Abnormal   Collection Time: 06/03/15 11:39 AM  Result Value Ref Range   Glucose-Capillary 256 (H) 65 - 99 mg/dL   Comment 1 Notify RN     Blood Alcohol level:  Lab Results  Component Value Date   ETH 14* 06/02/2015   ETH 30* 09/26/2011    Metabolic Disorder Labs:  No results found for: HGBA1C, MPG No results found for: PROLACTIN Lab Results  Component Value Date   CHOL 145 06/03/2015   TRIG 69 06/03/2015   HDL 34* 06/03/2015   CHOLHDL 4.3 06/03/2015   VLDL 14 06/03/2015   LDLCALC 97 06/03/2015    Current Medications: Current Facility-Administered Medications  Medication Dose Route Frequency Provider Last Rate Last Dose  . acetaminophen (TYLENOL) tablet 650 mg  650 mg Oral Q6H PRN Shari Prows, MD   650 mg at 06/03/15 1139  . alum & mag hydroxide-simeth (MAALOX/MYLANTA) 200-200-20 MG/5ML suspension 30 mL  30 mL Oral Q4H PRN Jolanta B Pucilowska, MD      . insulin glargine (LANTUS) injection 40 Units  40 Units Subcutaneous Daily Shari Prows, MD   40 Units at 06/03/15 0824  . magnesium hydroxide (MILK OF MAGNESIA) suspension 30 mL  30 mL Oral Daily PRN Jolanta B  Pucilowska, MD      . metFORMIN (GLUCOPHAGE) tablet 500 mg  500 mg Oral BID  WC Shari Prows, MD   500 mg at 06/03/15 0824  . nicotine polacrilex (NICORETTE) gum 2 mg  2 mg Oral PRN Jimmy Footman, MD      . risperiDONE (RISPERDAL) tablet 2 mg  2 mg Oral BID Shari Prows, MD   2 mg at 06/03/15 0824  . traZODone (DESYREL) tablet 100 mg  100 mg Oral QHS Shari Prows, MD   100 mg at 06/03/15 0208   PTA Medications: Prescriptions prior to admission  Medication Sig Dispense Refill Last Dose  . gabapentin (NEURONTIN) 300 MG capsule Take 300 mg by mouth 3 (three) times daily.   Past Month at Unknown time  . insulin glargine (LANTUS) 100 UNIT/ML injection Inject 40 Units into the skin every morning.    06/02/2015 at Unknown time    Musculoskeletal: Strength & Muscle Tone: within normal limits and flaccid Gait & Station: normal Patient leans: N/A  Psychiatric Specialty Exam: Physical Exam  Constitutional: He is oriented to person, place, and time. He appears well-developed and well-nourished.  HENT:  Head: Normocephalic and atraumatic.  Eyes: Conjunctivae and EOM are normal.  Neck: Normal range of motion.  Respiratory: Effort normal.  Musculoskeletal: Normal range of motion.  Neurological: He is alert and oriented to person, place, and time.    Review of Systems  Constitutional: Negative.   HENT: Negative.   Eyes: Negative.   Respiratory: Negative.   Cardiovascular: Negative.   Gastrointestinal: Negative.   Genitourinary: Negative.   Musculoskeletal: Negative.   Skin: Negative.   Neurological: Negative.   Endo/Heme/Allergies: Negative.   Psychiatric/Behavioral: Positive for hallucinations.    Blood pressure 136/82, pulse 65, temperature 98.1 F (36.7 C), temperature source Oral, resp. rate 20, height 6\' 4"  (1.93 m), weight 105.688 kg (233 lb).Body mass index is 28.37 kg/(m^2).  General Appearance: Well Groomed  Patent attorney::  Good  Speech:  Clear  and Coherent  Volume:  Normal  Mood:  Dysphoric  Affect:  Appropriate  Thought Process:  Logical  Orientation:  Full (Time, Place, and Person)  Thought Content:  Hallucinations: Auditory  Suicidal Thoughts:  No  Homicidal Thoughts:  No  Memory:  Immediate;   Good Recent;   Good Remote;   Good  Judgement:  Fair  Insight:  Fair  Psychomotor Activity:  Normal  Concentration:  Good  Recall:  Good  Fund of Knowledge:Good  Language: Good  Akathisia:  No  Handed:    AIMS (if indicated):     Assets:  Communication Skills Housing  ADL's:  Intact  Cognition: WNL  Sleep:  Number of Hours: 4     Treatment Plan Summary: Daily contact with patient to assess and evaluate symptoms and progress in treatment and Medication management   Schizophrenia the patient will be started on Abilify 15 mg by mouth daily.   Insomnia the patient will be started on trazodone 150 mg by mouth daily at bedtime  Diabetes the patient will be continued on Lantus 40 units by mouth daily at bedtime. I have ordered supplemental insulin as well. Continue Glucophage 500 mg by mouth twice a day.  Tobacco use disorder I will order nicotine gum prn   Diet carb modified  Vital signs daily  Discharge disposition was stable the patient will return home  Discharge follow-up he will continue to follow-up with [facility]   I certify that inpatient services furnished can reasonably be expected to improve the patient's condition.    Jimmy Footman, MD 2/28/20173:09 PM

## 2015-06-03 NOTE — ED Notes (Signed)

## 2015-06-03 NOTE — ED Notes (Signed)
[facility].  Initiating discharge/readmit.

## 2015-06-03 NOTE — Progress Notes (Signed)
Inpatient Diabetes Program Recommendations  AACE/ADA: New Consensus Statement on Inpatient Glycemic Control (2015)  Target Ranges:  Prepandial:   less than 140 mg/dL      Peak postprandial:   less than 180 mg/dL (1-2 hours)      Critically ill patients:  140 - 180 mg/dL  Results for Steven Christensen, Steven Christensen (MRN [id number]) as of 06/03/2015 09:2MAKSYMILIAN, MABEYe 06/03/2015 08:22  Glucose-Capillary Latest Ref Range: 65-99 mg/dL 784 (H)   Review of Glycemic Control  Diabetes history: DM2 Outpatient Diabetes medications: Lantus 40 units QAM, Metformin 500 mg BID (Metformin not on home med list but noted in H&P) Current orders for Inpatient glycemic control: Lantus 40 units daily, Metformin 500 mg BID  Inpatient Diabetes Program Recommendations: Correction (SSI): While inpatient, please consider ordering CBGs with Novolog sensitive correction scale ACHS. HgbA1C: A1C in process.  Thanks, Orlando Penner, RN, MSN, CDE Diabetes Coordinator Inpatient Diabetes Program [phone] (Team Pager from 8am to 5pm) [phone] ([facility] office) [phone] [facility] office) [phone] [facility] office)

## 2015-06-03 NOTE — Progress Notes (Signed)
Awake in room, A&Ox3, great insight of disease process, "Scat Zenaida Niece was not available to me to obtain my medications and then relapsed and wanted to hurt myself..." Denied SI/HI, AV/H at this time, will continue to monitor.

## 2015-06-03 NOTE — Plan of Care (Signed)
Problem: Ineffective individual coping Goal: STG: Patient will remain free from self harm Outcome: Progressing No self harm reported or observed     

## 2015-06-03 NOTE — Tx Team (Signed)
Initial Interdisciplinary Treatment Plan   PATIENT STRESSORS: Medication change or noncompliance paranoia   PATIENT STRENGTHS: General fund of knowledge Religious Affiliation   PROBLEM LIST: Problem List/Patient Goals Date to be addressed Date deferred Reason deferred Estimated date of resolution  Risk for suicide 06/03/15     paranoia 06/03/15     Psychosis 06/03/15     "better skills with people, alcohol and drugs" 06/03/15                                    DISCHARGE CRITERIA:  Improved stabilization in mood, thinking, and/or behavior Verbal commitment to aftercare and medication compliance  PRELIMINARY DISCHARGE PLAN: Attend aftercare/continuing care group Outpatient therapy  PATIENT/FAMIILY INVOLVEMENT: This treatment plan has been presented to and reviewed with the patient, Callahan Wild.  The patient and family have been given the opportunity to ask questions and make suggestions.  Jacques Navy A 06/03/2015, 2:34 AM

## 2015-06-03 NOTE — Tx Team (Signed)
Interdisciplinary Treatment Plan Update (Adult)  Date:  06/03/2015 Time Reviewed:  5:19 PM  Progress in Treatment: Attending groups: Yes. Participating in groups:  Yes. Taking medication as prescribed:  Yes. Tolerating medication:  Yes. Family/Significant othe contact made:  No, will contact:    Patient understands diagnosis:  Yes. Discussing patient identified problems/goals with staff:  Yes. Medical problems stabilized or resolved:  Yes. Denies suicidal/homicidal ideation: No. Issues/concerns per patient self-inventory:  Yes. Other:  New problem(s) identified: No, Describe:     Discharge Plan or Barriers: Discharge to home follow up [facility] with [facility] or [facility]  Reason for Continuation of Hospitalization: Hallucinations Suicidal ideation  Comments:Steven Christensen is an 45 y.o. male. Steven Christensen arrived to the ED by way of friends from his church. He reports that he has been hearing people say that he is going to die, and that they were going to kill him. He states that it got so bad that he started to say he would kill himself. He states that this has been happening "for a while". He states that he stays depressed and overwhelmed.[name] He states he hears the voices in the house and his fianc says she can't hear it; thinks his house is bugged. He denied wanting to hurt himself, but admits that sometimes he feels it will solve everything if he died, but knows that if he does he will burn in hell and killing himself won't solve the problems.  Estimated length of stay: 3-5 days  New goal(s):  Review of initial/current patient goals per problem list:   1.  Goal(s):Pt will participate in aftercare plan.  Met:  No  Target date:discharge  As evidenced DK:SMMO for housing and psychiatric follow up being identified  2.  Goal (s):Pt will exhibit decreased depressive symptoms and suicidal ideation.  Met:  No  Target date:discharge  As evidenced by:Pt will  utilize self-rating of depression at 3 or below and demonstrate decreased signs of depression OR be deemed stable by MD.  3.  Goal(s):Pt will demonstrate decrease in psychotic symptoms.  Met:  No  Target date:discharge  As evidenced CA:REQJEAD will produce a CIWA/COWS score of 0, have stable vital signs, and no symptoms of withdrawal.  Attendees: Patient:  Steven Christensen 2/28/20175:19 PM  Family:   2/28/20175:19 PM  Physician:  Jerilee Hoh 2/28/20175:19 PM  Nursing:   Floyde Parkins, RN 2/28/20175:19 PM  Case Manager:   2/28/20175:19 PM  Counselor:  Dossie Arbour, LCSW 2/28/20175:19 PM  Other:  Everitt Amber, [place] 2/28/20175:19 PM  Other:  Carmell Austria, LCSWA 2/28/20175:19 PM  Other:  Marylou Flesher, LCSWA 2/28/20175:19 PM  Other:  2/28/20175:19 PM  Other:  2/28/20175:19 PM  Other:  2/28/20175:19 PM  Other:  2/28/20175:19 PM  Other:  2/28/20175:19 PM  Other:  2/28/20175:19 PM  Other:   2/28/20175:19 PM   Scribe for Treatment Team:   August Saucer, 06/03/2015, 5:19 PM, MSW, LCSW

## 2015-06-03 NOTE — [facility] Group Notes (Signed)
[facility] Group Notes:  (Nursing/MHT/Case Management/Adjunct)  Date:  06/03/2015  Time:  10:06 PM  Type of Therapy:  Group Therapy  Participation Level:  Active  Participation Quality:  Appropriate  Affect:  Appropriate  Cognitive:  Appropriate  Insight:  Appropriate  Engagement in Group:  Engaged  Modes of Intervention:  Discussion  Summary of Progress/Problems:  Steven Christensen 06/03/2015, 10:06 PM

## 2015-06-03 NOTE — Progress Notes (Signed)
Recreation Therapy Notes  Date: 02.28.17 Time: 3:00 pm Location: Community Room  Group Topic: Self-expression  Goal Area(s) Addresses:  Patient will effectively use art as a means of self-expression. Patient will recognize positive benefit of self-expression. Patient will be able to identify one emotion experienced during group session. Patient will identify use of art/self-expression as a coping skill.  Behavioral Response: Attentive  Intervention: Two Faces of Me  Activity: Patients were given a blank face worksheet and instructed to draw or write how they felt when they were admitted to the hospital on one side of the face and draw or write how they want to feel when they are d/c on the other side of the face.  Education: LRT educated group on different forms of self-expression.  Education Outcome: In group clarification offered   Clinical Observations/Feedback: Patient left group with Dr. Ardyth Harps. Patient returned to group. Patient completed activity by writing how he felt when he was admitted to the hospital and how he wants to feel when he is d/c. Patient did not contribute to group discussion.  Jacquelynn Cree, LRT/CTRS 06/03/2015 4:26 PM
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
## 2015-06-03 NOTE — ED Notes (Signed)
Retrieved patient belongings from [facility]

## 2015-06-03 NOTE — ED Notes (Signed)
Gave report to [facility] RN, waiting on room cleaning to be completed before we can proceed with discharge/readmit.

## 2015-06-04 DIAGNOSIS — F203 Undifferentiated schizophrenia: Secondary | ICD-10-CM

## 2015-06-04 LAB — PROLACTIN: PROLACTIN: 74 ng/mL — AB (ref 4.0–15.2)

## 2015-06-04 LAB — GLUCOSE, CAPILLARY
GLUCOSE-CAPILLARY: 148 mg/dL — AB (ref 65–99)
GLUCOSE-CAPILLARY: 194 mg/dL — AB (ref 65–99)
Glucose-Capillary: 222 mg/dL — ABNORMAL HIGH (ref 65–99)
Glucose-Capillary: 193 mg/dL — ABNORMAL HIGH (ref 65–99)

## 2015-06-04 MED ORDER — GABAPENTIN 300 MG PO CAPS
300.0000 mg | ORAL_CAPSULE | Freq: Three times a day (TID) | ORAL | Status: DC
  Administered 2015-06-04 – 2015-06-06 (×7): 300 mg via ORAL
  Filled 2015-06-04 (×7): qty 1

## 2015-06-04 MED ORDER — ARIPIPRAZOLE ER 400 MG IM SUSR
400.0000 mg | INTRAMUSCULAR | Status: DC
  Administered 2015-06-04: 400 mg via INTRAMUSCULAR
  Filled 2015-06-04: qty 400

## 2015-06-04 MED ORDER — ONDANSETRON HCL 4 MG PO TABS
4.0000 mg | ORAL_TABLET | Freq: Four times a day (QID) | ORAL | Status: DC | PRN
  Administered 2015-06-06: 4 mg via ORAL
  Filled 2015-06-04: qty 1

## 2015-06-04 NOTE — Progress Notes (Addendum)
[facility] MD Progress Note  06/04/2015 7:08 PM Steven Christensen  MRN:  [id number]  Subjective:  Mr. Kilmer already reports much improvement. Paranoia is gone and the voices are quieter. He tolerates Abilify well. He wants to be discharged to home as quickly as possible and he agreed to take Abilify maintenance injection today to improve compliance. He has no transportation and getting up appointment has always been a problem. He does have Medicaid and could use Medicaid van if he only calls them on time. He enjoys group activities tremendously and would like to return for some group therapy following discharge. Sleep and appetite are good. He felt nauseated today but no vomiting.  Principal Problem: Schizophrenia (HCC) Diagnosis:   Patient Active Problem List   Diagnosis Date Noted  . Schizophrenia (HCC) [F20.9] 06/03/2015  . Diabetes (HCC) [E11.9] 06/02/2015  . Tobacco use disorder [F17.200] 06/02/2015  . Hemorrhoids [K64.9] 08/02/2013   Total Time spent with patient: 20 minutes  Past Psychiatric History: Schizophrenia.  Past Medical History:  Past Medical History  Diagnosis Date  . Diabetes mellitus   . Sleeping difficulties   . Anxiety   . Depression    History reviewed. No pertinent past surgical history. Family History: History reviewed. No pertinent family history. Family Psychiatric  History: See H&P. Social History:  History  Alcohol Use  . Yes     History  Drug Use  . Yes  . Special: "Crack" cocaine, Marijuana    Social History   Social History  . Marital Status: Single    Spouse Name: N/A  . Number of Children: N/A  . Years of Education: N/A   Social History Main Topics  . Smoking status: Current Every Day Smoker -- 1.00 packs/day for 35 years    Types: Cigarettes  . Smokeless tobacco: None  . Alcohol Use: Yes  . Drug Use: Yes    Special: "Crack" cocaine, Marijuana  . Sexual Activity: Yes    Birth Control/ Protection: None   Other Topics Concern  . None    Social History Narrative   Additional Social History:                         Sleep: Fair  Appetite:  Fair  Current Medications: Current Facility-Administered Medications  Medication Dose Route Frequency Provider Last Rate Last Dose  . acetaminophen (TYLENOL) tablet 650 mg  650 mg Oral Q6H PRN Shari Prows, MD   650 mg at 06/04/15 0826  . alum & mag hydroxide-simeth (MAALOX/MYLANTA) 200-200-20 MG/5ML suspension 30 mL  30 mL Oral Q4H PRN Nikash Mortensen B Larue Lightner, MD      . ARIPiprazole (ABILIFY) tablet 15 mg  15 mg Oral Daily Jimmy Footman, MD   15 mg at 06/04/15 0932  . ARIPiprazole SUSR 400 mg  400 mg Intramuscular Q28 days Shari Prows, MD   400 mg at 06/04/15 1838  . gabapentin (NEURONTIN) capsule 300 mg  300 mg Oral TID Shari Prows, MD   300 mg at 06/04/15 1744  . insulin aspart (novoLOG) injection 0-15 Units  0-15 Units Subcutaneous TID WC Jimmy Footman, MD   2 Units at 06/04/15 1740  . insulin aspart (novoLOG) injection 0-5 Units  0-5 Units Subcutaneous QHS Jimmy Footman, MD   2 Units at 06/03/15 2119  . insulin glargine (LANTUS) injection 40 Units  40 Units Subcutaneous Daily Shari Prows, MD   40 Units at 06/04/15 0932  . magnesium hydroxide (MILK OF  MAGNESIA) suspension 30 mL  30 mL Oral Daily PRN Antonela Freiman B Braven Wolk, MD      . metFORMIN (GLUCOPHAGE) tablet 500 mg  500 mg Oral BID WC Darreld Hoffer B Brylinn Teaney, MD   500 mg at 06/04/15 1741  . nicotine polacrilex (NICORETTE) gum 2 mg  2 mg Oral PRN Jimmy Footman, MD      . ondansetron [facility]) tablet 4 mg  4 mg Oral QID PRN Shari Prows, MD      . traZODone (DESYREL) tablet 100 mg  100 mg Oral QHS Shari Prows, MD   100 mg at 06/03/15 2122    Lab Results:  Results for orders placed or performed during the hospital encounter of 06/03/15 (from the past 48 hour(s))  Hemoglobin A1c     Status: Abnormal   Collection Time: 06/03/15   7:15 AM  Result Value Ref Range   Hgb A1c MFr Bld 9.0 (H) 4.0 - 6.0 %  Lipid panel, fasting     Status: Abnormal   Collection Time: 06/03/15  7:15 AM  Result Value Ref Range   Cholesterol 145 0 - 200 mg/dL   Triglycerides 69 <161 mg/dL   HDL 34 (L) >09 mg/dL   Total CHOL/HDL Ratio 4.3 RATIO   VLDL 14 0 - 40 mg/dL   LDL Cholesterol 97 0 - 99 mg/dL    Comment:        Total Cholesterol/HDL:CHD Risk Coronary Heart Disease Risk Table                     Men   Women  1/2 Average Risk   3.4   3.3  Average Risk       5.0   4.4  2 X Average Risk   9.6   7.1  3 X Average Risk  23.4   11.0        Use the calculated Patient Ratio above and the CHD Risk Table to determine the patient's CHD Risk.        ATP III CLASSIFICATION (LDL):  <100     mg/dL   Optimal  604-540  mg/dL   Near or Above                    Optimal  130-159  mg/dL   Borderline  981-191  mg/dL   High  >478     mg/dL   Very High   TSH     Status: None   Collection Time: 06/03/15  7:15 AM  Result Value Ref Range   TSH 0.684 0.350 - 4.500 uIU/mL  Prolactin     Status: Abnormal   Collection Time: 06/03/15  7:15 AM  Result Value Ref Range   Prolactin 74.0 (H) 4.0 - 15.2 ng/mL    Comment: (NOTE) Performed At: [facility] [address] [place], [place] [id number] Mila Homer MD MV:[id number]   Glucose, capillary     Status: Abnormal   Collection Time: 06/03/15  8:22 AM  Result Value Ref Range   Glucose-Capillary 255 (H) 65 - 99 mg/dL  Glucose, capillary     Status: Abnormal   Collection Time: 06/03/15 11:39 AM  Result Value Ref Range   Glucose-Capillary 256 (H) 65 - 99 mg/dL   Comment 1 Notify RN   Glucose, capillary     Status: Abnormal   Collection Time: 06/03/15  4:30 PM  Result Value Ref Range   Glucose-Capillary 213 (H) 65 - 99 mg/dL  Glucose, capillary     Status: Abnormal   Collection Time: 06/03/15  9:05 PM  Result Value Ref Range   Glucose-Capillary 139 (H) 65 - 99 mg/dL  Glucose,  capillary     Status: Abnormal   Collection Time: 06/04/15  6:41 AM  Result Value Ref Range   Glucose-Capillary 222 (H) 65 - 99 mg/dL  Glucose, capillary     Status: Abnormal   Collection Time: 06/04/15 12:03 PM  Result Value Ref Range   Glucose-Capillary 193 (H) 65 - 99 mg/dL   Comment 1 Notify RN   Glucose, capillary     Status: Abnormal   Collection Time: 06/04/15  4:40 PM  Result Value Ref Range   Glucose-Capillary 148 (H) 65 - 99 mg/dL   Comment 1 Notify RN    Comment 2 Document in Chart     Blood Alcohol level:  Lab Results  Component Value Date   ETH 14* 06/02/2015   ETH 30* 09/26/2011    Physical Findings: AIMS: Facial and Oral Movements Muscles of Facial Expression: None, normal Lips and Perioral Area: None, normal Jaw: None, normal Tongue: None, normal,Extremity Movements Upper (arms, wrists, hands, fingers): None, normal Lower (legs, knees, ankles, toes): None, normal, Trunk Movements Neck, shoulders, hips: None, normal, Overall Severity Severity of abnormal movements (highest score from questions above): None, normal Incapacitation due to abnormal movements: None, normal Patient's awareness of abnormal movements (rate only patient's report): No Awareness, Dental Status Current problems with teeth and/or dentures?: No Does patient usually wear dentures?: No  CIWA:  CIWA-Ar Total: 0 COWS:  COWS Total Score: 1  Musculoskeletal: Strength & Muscle Tone: within normal limits Gait & Station: normal Patient leans: N/A  Psychiatric Specialty Exam: Review of Systems  Psychiatric/Behavioral: Positive for hallucinations.  All other systems reviewed and are negative.   Blood pressure 141/80, pulse 55, temperature 98 F (36.7 C), temperature source Oral, resp. rate 20, height 6\' 4"  (1.93 m), weight 105.688 kg (233 lb).Body mass index is 28.37 kg/(m^2).  General Appearance: Casual  Eye Contact::  Good  Speech:  Clear and Coherent  Volume:  Normal  Mood:  Anxious   Affect:  Blunt  Thought Process:  Goal Directed  Orientation:  Full (Time, Place, and Person)  Thought Content:  Delusions, Hallucinations: Auditory and Paranoid Ideation  Suicidal Thoughts:  No  Homicidal Thoughts:  No  Memory:  Immediate;   Fair Recent;   Fair Remote;   Fair  Judgement:  Poor  Insight:  Shallow  Psychomotor Activity:  Normal  Concentration:  Fair  Recall:  [employer] of Knowledge:Fair  Language: Fair  Akathisia:  No  Handed:  Right  AIMS (if indicated):     Assets:  Communication Skills Desire for Improvement Financial Resources/Insurance Housing Physical Health Resilience Social Support  ADL's:  Intact  Cognition: WNL  Sleep:  Number of Hours: 7   Treatment Plan Summary: Daily contact with patient to assess and evaluate symptoms and progress in treatment and Medication management   Schizophrenia the patient will be started on Abilify 15 mg by mouth daily. He agreed to take Abilify maintenna injection to improve compliance.  Insomnia the patient will be started on trazodone 150 mg by mouth daily at bedtime  Diabetes the patient will be continued on Lantus 40 units by mouth daily at bedtime. I have ordered supplemental insulin as well. Continue Glucophage 500 mg by mouth twice a day.  Tobacco use disorder I will order nicotine gum prn  Diet carb modified  Vital signs daily  Nausea. We started Zofran.  Metabolic syndrome screening. Lipid profile and TSH are normal, hemoglobin A1c 9, prolactin 74.   Discharge disposition was stable the patient will return home  Discharge follow-up he will continue to follow-up with Emmaline Kluver, MD 06/04/2015, 7:08 PM

## 2015-06-04 NOTE — Plan of Care (Signed)
Problem: Ineffective individual coping Goal: STG: Patient will remain free from self harm Outcome: Progressing Medications administered as ordered by the physician, medications Therapeutic Effects, SEs and Adverse effects discussed, questions encouraged; no PRN given, 15 minute checks maintained for safety, clinical and moral support provided, patient encouraged to continue to express feelings and demonstrate safe care. Patient remain free from harm, will continue to monitor.         

## 2015-06-04 NOTE — [facility] Group Notes (Signed)
[facility] LCSW Aftercare Discharge Planning Group Note   06/04/2015 3:21 PM  Participation Quality:  Patient attended group and introduced himself sharing his SMART goal is to "get better coping skills, better attitude on life, get my temper under control, and talk to people better". Patient was able to identify with other group members who struggle with controlling their reaction to emotions of anger and talked about coping skills that work for each individual. Patient reports he is having difficulty sleeping.  Mood/Affect:  Appropriate  Depression Rating:  6  Anxiety Rating:  9  Thoughts of Suicide:  No Will you contract for safety?   NA  Current AVH:  No  Plan for Discharge/Comments:  Home with outpatient follow up  Transportation Means: patient reports he has access to transportation  Supports: family and outpatient provider per patient   Lulu Riding, MSW, LCSWA

## 2015-06-04 NOTE — [facility] Group Notes (Signed)
[facility] Group Notes:  (Nursing/MHT/Case Management/Adjunct)  Date:  06/04/2015  Time:  2:10 PM  Type of Therapy:  Group Therapy  Participation Level:  Active  Participation Quality:  Sharing  Affect:  Appropriate  Cognitive:  Appropriate  Insight:  Good  Engagement in Group:  Engaged  Modes of Intervention:  Support  Summary of Progress/Problems:  Steven Christensen 06/04/2015, 2:10 PM

## 2015-06-04 NOTE — Plan of Care (Signed)
Problem: Ineffective individual coping Goal: STG: Pt will be able to identify effective and ineffective STG: Pt will be able to identify effective and ineffective coping patterns  Outcome: Progressing Patient attended groups and observed interacting appropriately with peers.

## 2015-06-04 NOTE — Plan of Care (Signed)
Problem: [facility] Participation in Recreation Therapeutic Interventions Goal: STG-Other Recreation Therapy Goal (Specify) STG: Stress Management - Within 4 treatment sessions, patient will verbalize understanding of the stress management techniques in each of 2 treatment sessions to increase stress management skills post d/c.  Outcome: Progressing Treatment Session 1; Completed 1 out of 2: At approximately 12:05 pm, LRT met with patient in consultation room. LRT educated and provided patient with handouts on stress management techniques. Patient verbalized understanding. LRT encouraged patient to read over and practice the stress management techniques.  Intervention Used: Stress Management handouts  Leonette Monarch, LRT/CTRS 03.01.17 1:42 pm

## 2015-06-04 NOTE — Progress Notes (Signed)
Patient states that his mood is good. Denies SI and contracted for safety. Denies +ve AH/VH, attended groups and seen interacting appropriately with peers and staff. Voiced that he wants to be able to get discharged soon.

## 2015-06-04 NOTE — [facility] Group Notes (Signed)
[facility] LCSW Group Therapy  06/04/2015 3:01 PM  Type of Therapy:  Group Therapy  Participation Level:  Active  Participation Quality:  Appropriate and Attentive  Affect:  Appropriate  Cognitive:  Alert, Appropriate and Oriented  Insight:  Engaged  Engagement in Therapy:  Engaged  Modes of Intervention:  Discussion, Socialization and Support  Summary of Progress/Problems: Patient attended and participated in group appropriately introducing himself and sharing his 'super power' is "trying to help people and do for others that can't do for themselves". Patient was attentive throughout group discussion and shared that anger is an emotion he balances and states his coping skill is listening to music and exercise.   Lulu Riding, MSW, LCSWA 06/04/2015, 3:01 PM

## 2015-06-04 NOTE — Progress Notes (Signed)
Recreation Therapy Notes  Date: 03.01.17 Time: 3:00 pm Location: Community Room  Group Topic: Self-esteem  Goal Area(s) Addresses:  Patients will write at least one positive trait about themselves. Patients will verbalize benefit of having a healthy self-esteem.  Behavioral Response: Attentive, Interactive  Intervention: I Am  Activity: Patients were given a worksheet with the letter I on it and instructed to write as many positive traits about themselves inside the letter I.  Education: LRT educated patients on ways they can increase their self-esteem.  Education Outcome: Acknowledges education/In group clarification offered   Clinical Observations/Feedback: Patient completed activity by writing positive traits down. Patient contributed to group discussion by stating how his self-esteem affects him, how it felt to write positive traits, and how he can increase his self-esteem.  Jacquelynn Cree, LRT/CTRS 06/04/2015 4:27 PM

## 2015-06-05 LAB — GLUCOSE, CAPILLARY
GLUCOSE-CAPILLARY: 182 mg/dL — AB (ref 65–99)
GLUCOSE-CAPILLARY: 189 mg/dL — AB (ref 65–99)
GLUCOSE-CAPILLARY: 163 mg/dL — AB (ref 65–99)

## 2015-06-05 MED ORDER — ARIPIPRAZOLE 10 MG PO TABS
20.0000 mg | ORAL_TABLET | Freq: Every day | ORAL | Status: DC
  Administered 2015-06-06: 20 mg via ORAL
  Filled 2015-06-05: qty 2

## 2015-06-05 MED ORDER — INSULIN ASPART 100 UNIT/ML ~~LOC~~ SOLN
0.0000 [IU] | Freq: Three times a day (TID) | SUBCUTANEOUS | Status: DC
  Administered 2015-06-05 – 2015-06-06 (×3): 2 [IU] via SUBCUTANEOUS
  Filled 2015-06-05: qty 2

## 2015-06-05 MED ORDER — ARIPIPRAZOLE 15 MG PO TABS: 30.0000 mg | ORAL_TABLET | Freq: Every day | ORAL | Status: DC

## 2015-06-05 MED ORDER — INSULIN ASPART 100 UNIT/ML ~~LOC~~ SOLN
0.0000 [IU] | Freq: Every day | SUBCUTANEOUS | Status: DC
Start: 2015-06-05 — End: 2015-06-05

## 2015-06-05 NOTE — [facility] Group Notes (Signed)
[facility] Group Notes:  (Nursing/MHT/Case Management/Adjunct)  Date:  06/05/2015  Time:  2:27 PM  Type of Therapy:  Psychoeducational Skills  Participation Level:  Minimal  Participation Quality:  Appropriate  Affect:  Appropriate  Cognitive:  Appropriate  Insight:  Appropriate  Engagement in Group:  Limited  Modes of Intervention:  Discussion and Education  Summary of Progress/Problems:  Steven Christensen 06/05/2015, 2:27 PM

## 2015-06-05 NOTE — [facility] Group Notes (Signed)
[facility] Group Notes:  (Nursing/MHT/Case Management/Adjunct)  Date:  06/05/2015  Time:  8:53 AM  Type of Therapy:  Community Meeting   Participation Level:  Active  Participation Quality:  Appropriate and Attentive  Affect:  Appropriate  Cognitive:  Alert, Appropriate and Oriented  Insight:  Appropriate  Engagement in Group:  Engaged  Modes of Intervention:  Discussion and Education  Summary of Progress/Problems:  Steven Christensen 06/05/2015, 8:53 AM

## 2015-06-05 NOTE — Progress Notes (Signed)
[facility] MD Progress Note  06/05/2015 1:38 PM Steven Christensen  MRN:  [id number]  Subjective:  Patient reports doing much better with the Abilify. He has been tolerating this medication well. He feels that the voices have decreased. He reports decrease in the paranoia as well. The patient has been seen interacting with peers some. He is frequently seen in the day room and he has been participating in programming.  Patient denies suicidality, homicidality. Her having major problems with mood, appetite, energy is sleep or concentration. Patient receive Abilify Mantenna on 3/1  Per nursing: D: Pt denies SI/HI/AVH, affect is flat and sad but brightens upon approach, patient appears less anxious and he is interacting with peers and staff appropriately.  A: Pt was offered support and encouragement. Pt was given scheduled medications. Pt was encouraged to attend groups. Q 15 minute checks were done for safety.  R:Pt attends groups and interacts well with peers and staff. Pt is taking medication. Pt has no complaints.Pt receptive to treatment and safety maintained on unit.  Principal Problem: Schizophrenia (HCC) Diagnosis:   Patient Active Problem List   Diagnosis Date Noted  . Schizophrenia (HCC) [F20.9] 06/03/2015  . Diabetes (HCC) [E11.9] 06/02/2015  . Tobacco use disorder [F17.200] 06/02/2015  . Hemorrhoids [K64.9] 08/02/2013   Total Time spent with patient: 20 minutes  Past Psychiatric History: Schizophrenia.  Past Medical History:  Past Medical History  Diagnosis Date  . Diabetes mellitus   . Sleeping difficulties   . Anxiety   . Depression    History reviewed. No pertinent past surgical history. Family History: History reviewed. No pertinent family history. Family Psychiatric  History: See H&P. Social History:  History  Alcohol Use  . Yes     History  Drug Use  . Yes  . Special: "Crack" cocaine, Marijuana    Social History   Social History  . Marital Status: Single    Spouse  Name: N/A  . Number of Children: N/A  . Years of Education: N/A   Social History Main Topics  . Smoking status: Current Every Day Smoker -- 1.00 packs/day for 35 years    Types: Cigarettes  . Smokeless tobacco: None  . Alcohol Use: Yes  . Drug Use: Yes    Special: "Crack" cocaine, Marijuana  . Sexual Activity: Yes    Birth Control/ Protection: None   Other Topics Concern  . None   Social History Narrative    Sleep: Good  Appetite:  Good  Current Medications: Current Facility-Administered Medications  Medication Dose Route Frequency Provider Last Rate Last Dose  . acetaminophen (TYLENOL) tablet 650 mg  650 mg Oral Q6H PRN Shari Prows, MD   650 mg at 06/05/15 0904  . alum & mag hydroxide-simeth (MAALOX/MYLANTA) 200-200-20 MG/5ML suspension 30 mL  30 mL Oral Q4H PRN Shari Prows, MD      . Melene Muller ON 06/06/2015] ARIPiprazole (ABILIFY) tablet 20 mg  20 mg Oral Daily Jimmy Footman, MD      . ARIPiprazole SUSR 400 mg  400 mg Intramuscular Q28 days Shari Prows, MD   400 mg at 06/04/15 1838  . gabapentin (NEURONTIN) capsule 300 mg  300 mg Oral TID Shari Prows, MD   300 mg at 06/05/15 0904  . insulin aspart (novoLOG) injection 0-15 Units  0-15 Units Subcutaneous TID WC Jimmy Footman, MD   3 Units at 06/05/15 1207  . insulin aspart (novoLOG) injection 0-5 Units  0-5 Units Subcutaneous QHS Jimmy Footman, MD  2 Units at 06/03/15 2119  . insulin glargine (LANTUS) injection 40 Units  40 Units Subcutaneous Daily Shari Prows, MD   40 Units at 06/05/15 0956  . magnesium hydroxide (MILK OF MAGNESIA) suspension 30 mL  30 mL Oral Daily PRN Jolanta B Pucilowska, MD      . metFORMIN (GLUCOPHAGE) tablet 500 mg  500 mg Oral BID WC Jolanta B Pucilowska, MD   500 mg at 06/05/15 0824  . nicotine polacrilex (NICORETTE) gum 2 mg  2 mg Oral PRN Jimmy Footman, MD      . ondansetron [facility]) tablet 4 mg  4 mg Oral QID PRN  Shari Prows, MD      . traZODone (DESYREL) tablet 100 mg  100 mg Oral QHS Shari Prows, MD   100 mg at 06/04/15 2239    Lab Results:  Results for orders placed or performed during the hospital encounter of 06/03/15 (from the past 48 hour(s))  Glucose, capillary     Status: Abnormal   Collection Time: 06/03/15  4:30 PM  Result Value Ref Range   Glucose-Capillary 213 (H) 65 - 99 mg/dL  Glucose, capillary     Status: Abnormal   Collection Time: 06/03/15  9:05 PM  Result Value Ref Range   Glucose-Capillary 139 (H) 65 - 99 mg/dL  Glucose, capillary     Status: Abnormal   Collection Time: 06/04/15  6:41 AM  Result Value Ref Range   Glucose-Capillary 222 (H) 65 - 99 mg/dL  Glucose, capillary     Status: Abnormal   Collection Time: 06/04/15 12:03 PM  Result Value Ref Range   Glucose-Capillary 193 (H) 65 - 99 mg/dL   Comment 1 Notify RN   Glucose, capillary     Status: Abnormal   Collection Time: 06/04/15  4:40 PM  Result Value Ref Range   Glucose-Capillary 148 (H) 65 - 99 mg/dL   Comment 1 Notify RN    Comment 2 Document in Chart   Glucose, capillary     Status: Abnormal   Collection Time: 06/04/15  9:37 PM  Result Value Ref Range   Glucose-Capillary 194 (H) 65 - 99 mg/dL  Glucose, capillary     Status: Abnormal   Collection Time: 06/05/15  6:47 AM  Result Value Ref Range   Glucose-Capillary 182 (H) 65 - 99 mg/dL  Glucose, capillary     Status: Abnormal   Collection Time: 06/05/15 11:55 AM  Result Value Ref Range   Glucose-Capillary 189 (H) 65 - 99 mg/dL   Comment 1 Document in Chart     Blood Alcohol level:  Lab Results  Component Value Date   ETH 14* 06/02/2015   ETH 30* 09/26/2011    Physical Findings: AIMS: Facial and Oral Movements Muscles of Facial Expression: None, normal Lips and Perioral Area: None, normal Jaw: None, normal Tongue: None, normal,Extremity Movements Upper (arms, wrists, hands, fingers): None, normal Lower (legs, knees, ankles,  toes): None, normal, Trunk Movements Neck, shoulders, hips: None, normal, Overall Severity Severity of abnormal movements (highest score from questions above): None, normal Incapacitation due to abnormal movements: None, normal Patient's awareness of abnormal movements (rate only patient's report): No Awareness, Dental Status Current problems with teeth and/or dentures?: No Does patient usually wear dentures?: No  CIWA:  CIWA-Ar Total: 0 COWS:  COWS Total Score: 1  Musculoskeletal: Strength & Muscle Tone: within normal limits Gait & Station: normal Patient leans: N/A  Psychiatric Specialty Exam: Review of Systems  Constitutional: Negative.  HENT: Negative.   Eyes: Negative.   Respiratory: Negative.   Cardiovascular: Negative.   Gastrointestinal: Negative.   Genitourinary: Negative.   Musculoskeletal: Negative.   Skin: Negative.   Neurological: Negative.   Endo/Heme/Allergies: Negative.   Psychiatric/Behavioral: Negative.     Blood pressure 143/89, pulse 62, temperature 98.2 F (36.8 C), temperature source Oral, resp. rate 20, height  (1.93 m), weight 105.688 kg (233 lb).Body mass index is 28.37 kg/(m^2).  General Appearance: Casual  Eye Contact::  Good  Speech:  Clear and Coherent  Volume:  Normal  Mood:  Dysphoric  Affect:  Appropriate and Congruent  Thought Process:  Goal Directed  Orientation:  Full (Time, Place, and Person)  Thought Content:  Delusions, Hallucinations: Auditory and Paranoid Ideation  Suicidal Thoughts:  No  Homicidal Thoughts:  No  Memory:  Immediate;   Fair Recent;   Fair Remote;   Fair  Judgement:  Poor  Insight:  Shallow  Psychomotor Activity:  Normal  Concentration:  Fair  Recall:  [employer] of Knowledge:Fair  Language: Fair  Akathisia:  No  Handed:  Right  AIMS (if indicated):     Assets:  Communication Skills Desire for Improvement Financial Resources/Insurance Housing Physical Health Resilience Social Support  ADL's:   Intact  Cognition: WNL  Sleep:  Number of Hours: 6.75   Treatment Plan Summary: Daily contact with patient to assess and evaluate symptoms and progress in treatment and Medication management   Schizophrenia the patient will be started on Abilify. Today I plan to increase abilify to 20 mg. He agreed to take Abilify maintenna injection to improve compliance (given on 06/04/15).  Insomnia the patient will be started on trazodone 150 mg by mouth daily at bedtime  Diabetes the patient will be continued on Lantus 40 units by mouth daily at bedtime. I have ordered supplemental insulin as well. Continue Glucophage 500 mg by mouth twice a day.  Tobacco use disorder I will order nicotine gum prn  Diet carb modified  Vital signs daily  Nausea. We started Zofran.  Metabolic syndrome screening. Lipid profile and TSH are normal, hemoglobin A1c 9, prolactin 74.   Discharge disposition was stable the patient will return home  Discharge follow-up he will continue to follow-up with Vesta Mixer  Will likely d/c home early next week  Jimmy Footman, MD 06/05/2015, 1:38 PM

## 2015-06-05 NOTE — Progress Notes (Signed)
Recreation Therapy Notes  Date: 03.02.17 Time: 3:00 pm Location: Community Room  Group Topic: Leisure Education  Goal Area(s) Addresses:  Patient will write down one healthy leisure activity. Patient will verbalize benefit of using leisure as a Associate Professor.  Behavioral Response: Attentive, Interactive  Intervention: Leisure Time  Activity: Patients were instructed to write one healthy leisure activity. Patients were given a Leisure Time Clock worksheet and instructed to fill it out. Patients were asked to give 10 positive emotions. Patients connected the leisure activities to the positive emotions.  Education: LRT educated patients on what they need to participate in leisure.  Education Outcome: In group clarification offered  Clinical Observations/Feedback: Patient wrote healthy leisure activity. Patient completed Leisure Time Clock worksheet. Patient did not verbalize a positive emotion. Patient did not connect the healthy leisure activities to the positive emotions. Patient contributed to group discussion. Patient did have side conversation with peer. LRT redirected and patient complied.  Jacquelynn Cree, LRT/CTRS 06/05/2015 4:40 PM

## 2015-06-05 NOTE — [facility] Group Notes (Signed)
[facility] LCSW Group Therapy  06/05/2015 1:12 PM  Type of Therapy:  Group Therapy  Participation Level:  Minimal  Participation Quality:  Appropriate and Attentive  Affect:  Appropriate  Cognitive:  Alert, Appropriate and Oriented  Insight:  Improving  Engagement in Therapy:  Improving  Modes of Intervention:  Socialization and Support  Summary of Progress/Problems: Patient attended group and participated minimally introducing himself and sharing his self-care activity is "fishing and landscaping". Patient was attentive throughout group and shared that he would like to communicate boundaries to his family to help bring balance to his life but nothing specific was shared that he wanted to change.   Lulu Riding, MSW, LCSWA 06/05/2015, 1:12 PM

## 2015-06-05 NOTE — Plan of Care (Signed)
Problem: Ineffective individual coping Goal: STG: Patient will remain free from self harm Outcome: Progressing Patient remains free from self harm this shift     

## 2015-06-05 NOTE — Tx Team (Signed)
Interdisciplinary Treatment Plan Update (Adult)  Date:  06/05/2015 Time Reviewed:  10:48 AM  Progress in Treatment: Attending groups: Yes. Participating in groups:  Yes. Taking medication as prescribed:  Yes. Tolerating medication:  Yes. Family/Significant othe contact made:  No, will contact:  Steven Christensen Patient understands diagnosis:  Yes. Discussing patient identified problems/goals with staff:  Yes. Medical problems stabilized or resolved:  No. Denies suicidal/homicidal ideation: Yes. Issues/concerns per patient self-inventory:  Yes. Other:  New problem(s) identified: No, Describe:  NA  Discharge Plan or Barriers: Pt plans to return home and follow up with outpatient.    Reason for Continuation of Hospitalization: Hallucinations Medication stabilization Other; describe Paranoia  Comments:Steven Christensen is an 45 y.o. male. Steven Christensen arrived to the ED by way of friends from his church. He reports that he has been hearing people say that he is going to die, and that they were going to kill him. He states that it got so bad that he started to say he would kill himself. He states that this has been happening "for a while". He states that he stays depressed and overwhelmed.[name] He states he hears the voices in the house and his fianc says she can't hear it; thinks his house is bugged. He denied wanting to hurt himself, but admits that sometimes he feels it will solve everything if he died, but knows that if he does he will burn in hell and killing himself won't solve the problems. He reports that he had a prior diagnosed with schizophrenia. He states he stopped the medication in 1-2 years as he did not have transportation to make it to [facility]. Alcohol or drugs.BAL = 14. UDS is negative. Substance abuse: smokes 1 pack/day. Denies abusing alcohol, or any illicit drugs.   Estimated length of stay: 4 days   New goal(s): NA  Review of initial/current patient goals per problem list:    1.  Goal(s): Patient will participate in aftercare plan * Met:  * Target date: at discharge * As evidenced by: Patient will participate within aftercare plan AEB aftercare provider and housing plan at discharge being identified.   2.  Goal (s): Patient will exhibit decreased depressive symptoms and suicidal ideations. * Met:  *  Target date: at discharge * As evidenced by: Patient will utilize self rating of depression at 3 or below and demonstrate decreased signs of depression or be deemed stable for discharge by MD.   3.  Goal(s): Patient will demonstrate decreased signs and symptoms of anxiety. * Met:  * Target date: at discharge * As evidenced by: Patient will utilize self rating of anxiety at 3 or below and demonstrated decreased signs of anxiety, or be deemed stable for discharge by MD  4.  Goal (s): Patient will demonstrate decreased symptoms of psychosis. * Met: No  *  Target date: at discharge * As evidenced by: Patient will not endorse signs of psychosis or be deemed stable for discharge by MD.   Attendees: Patient:  Steven Christensen 3/2/201710:48 AM  Family:   3/2/201710:48 AM  Physician:  Dr. Jerilee Hoh   3/2/201710:48 AM  Nursing:   Meredith Mody, RN  3/2/201710:48 AM  Case Manager:   3/2/201710:48 AM  Counselor:   3/2/201710:48 AM  Other:  Wray Kearns, LCSWA 3/2/201710:48 AM  Other:  Everitt Amber, LRT  3/2/201710:48 AM  Other:   3/2/201710:48 AM  Other:  3/2/201710:48 AM  Other:  3/2/201710:48 AM  Other:  3/2/201710:48 AM  Other:  3/2/201710:48 AM  Other:  3/2/201710:48 AM  Other:  3/2/201710:48 AM  Other:   3/2/201710:48 AM   Scribe for Treatment Team:   Wray Kearns, MSW, LCSWA  06/05/2015, 10:48 AM

## 2015-06-05 NOTE — Plan of Care (Signed)
Problem: Ineffective individual coping Goal: LTG: Patient will report a decrease in negative feelings Outcome: Progressing Patient reports feeling better today than she did yesterday     

## 2015-06-05 NOTE — Progress Notes (Signed)
D: Pt denies SI/HI/AVH, affect is flat and sad but brightens upon approach, patient appears less anxious and he is interacting with peers and staff appropriately.  A: Pt was offered support and encouragement. Pt was given scheduled medications. Pt was encouraged to attend groups. Q 15 minute checks were done for safety.  R:Pt attends groups and interacts well with peers and staff. Pt is taking medication. Pt has no complaints.Pt receptive to treatment and safety maintained on unit.

## 2015-06-05 NOTE — Progress Notes (Signed)

## 2015-06-05 NOTE — Plan of Care (Signed)
Problem: [facility] Participation in Recreation Therapeutic Interventions Goal: STG-Other Recreation Therapy Goal (Specify) STG: Stress Management - Within 4 treatment sessions, patient will verbalize understanding of the stress management techniques in each of 2 treatment sessions to increase stress management skills post d/c.  Outcome: Completed/Met Date Met:  06/05/15 Treatment Session 2; Completed 2 out of 2: At approximately 12:50 pm, LRT met with patient in patient room. LRT explained stress management techniques. Patient verbalized understanding. LRT encouraged patient to use the techniques. Intervention Used: Stress Management handouts  Leonette [facility], LRT/CTRS 03.02.17 2:33 pm

## 2015-06-05 NOTE — Plan of Care (Signed)
Problem: Alteration in thought process Goal: STG-Patient does not respond to command hallucinations Outcome: Progressing Patient denies SI/HI.

## 2015-06-06 LAB — GLUCOSE, CAPILLARY
GLUCOSE-CAPILLARY: 183 mg/dL — AB (ref 65–99)
Glucose-Capillary: 158 mg/dL — ABNORMAL HIGH (ref 65–99)
Glucose-Capillary: 185 mg/dL — ABNORMAL HIGH (ref 65–99)

## 2015-06-06 MED ORDER — ARIPIPRAZOLE 20 MG PO TABS: 20.0000 mg | ORAL_TABLET | Freq: Every day | ORAL | Status: DC

## 2015-06-06 MED ORDER — METFORMIN HCL 500 MG PO TABS: 500.0000 mg | ORAL_TABLET | Freq: Two times a day (BID) | ORAL | Status: DC

## 2015-06-06 MED ORDER — TRAZODONE HCL 100 MG PO TABS: 100.0000 mg | ORAL_TABLET | Freq: Every day | ORAL | Status: DC

## 2015-06-06 MED ORDER — ARIPIPRAZOLE ER 400 MG IM SUSR: 400.0000 mg | INTRAMUSCULAR | Status: DC

## 2015-06-06 MED ORDER — GABAPENTIN 300 MG PO CAPS: 300.0000 mg | ORAL_CAPSULE | Freq: Three times a day (TID) | ORAL | Status: DC

## 2015-06-06 NOTE — [facility] Group Notes (Signed)
[facility] LCSW Group Therapy  06/06/2015 2:58 PM  Type of Therapy:  Group Therapy  Participation Level:  Minimal  Participation Quality:  Appropriate and Attentive  Affect:  Appropriate  Cognitive:  Alert, Appropriate and Oriented  Insight:  Improving  Engagement in Therapy:  Improving  Modes of Intervention:  Education, Socialization and Support  Summary of Progress/Problems: Patient attended group and was attentive throughout group session as a video was shown "Happy" by [employer] and [employer] and by [employer] Happy. Patient was attentive during group but minimal participation in discussions after video.   Lulu RidingIngle, Rumi Kolodziej T, MSW, LCSWA 06/06/2015, 2:58 PM

## 2015-06-06 NOTE — Progress Notes (Signed)
  [facility] Discharge Plan :  Will you be returning to the same living situation after discharge:  Yes,  Home  At discharge, do you have transportation home?: Yes,  mother  Do you have the ability to pay for your medications: Yes,  Insurance   Release of information consent forms completed and in the chart;  Patient's signature needed at discharge.  Patient to Follow up at: Follow-up Information    [facility].   Specialty:  Behavioral Health   Why:  Your hospital follow up appointment will walk in. Walk in hours are Monday through Friday between 8:00am-10:00am.    Contact information:   [address] [place] [address] [phone]       Next level of care provider has [facility] to [facility] Link:no  Safety Planning and Suicide Prevention discussed: Yes,  with patient and mother   Have you used any form of tobacco in the last 30 days? (Cigarettes, Smokeless Tobacco, Cigars, and/or Pipes): Yes  Has patient been referred to the Quitline?: Patient refused referral  Patient has been referred for addiction treatment: N/A  Rondall Allegraandace L Brayah Urquilla MSW, LCSWA  06/06/2015, 2:40 PM

## 2015-06-06 NOTE — Discharge Summary (Signed)
Physician Discharge Summary Note  Patient:  Steven Christensen is an 45 y.o., male MRN:  [id number] DOB:  04-26-1970 Patient phone:  [phone] (home)  Patient address:   [name] [address],  Total Time spent with patient: 30 minutes  Date of Admission:  06/03/2015 Date of Discharge: 06/06/15  Reason for Admission:  psychosis  Principal Problem: Schizophrenia [facility]) Discharge Diagnoses: Patient Active Problem List   Diagnosis Date Noted  . Schizophrenia ([place]) [F20.9] 06/03/2015  . Diabetes ([place]) [E11.9] 06/02/2015  . Tobacco use disorder [F17.200] 06/02/2015  . Hemorrhoids [K64.9] 08/02/2013   History of Present Illness: Steven Christensen is an 45 y.o. male. Mr. Deroy arrived to the ED by way of friends from his church. He reports that he has been hearing people say that he is going to die, and that they were going to kill him. He states that it got so bad that he started to say he would kill himself. He states that this has been happening "for a while". He states that he stays depressed and overwhelmed.[name] He states he hears the voices in the house and his fianc says she can't hear it; thinks his house is bugged. He denied wanting to hurt himself, but admits that sometimes he feels it will solve everything if he died, but knows that if he does he will burn in hell and killing himself won't solve the problems.  He reports that he had a prior diagnosed with schizophrenia. He states he stopped the medication in 1-2 years as he did not have transportation to make it to [employer].  Alcohol or drugs.BAL = 14. UDS is negative.  Substance abuse: smokes 1 pack/day. Denies abusing alcohol, or any illicit drugs.  Associated Signs/Symptoms: Depression Symptoms: depressed mood, (Hypo) Manic Symptoms: denies Anxiety Symptoms: denies Psychotic Symptoms: Hallucinations: Auditory Ideas of Reference, Paranoia, PTSD Symptoms: NA Total Time spent with patient: 1  hour  Past Psychiatric History: Patient reports being diagnosed with schizophrenia back in 2003. [facility] after he had overdosed on aspirin. He has been at the state facility 3 times in the past his last hospitalization there was in 2007. 2 years ago the patient follow up with [facility]. He has not seen a psychiatrist or taking any psychiatric medication in more than 12 months.     Past Medical History: Patient suffers from diabetes. Patient had a seizure as a child but denies any seizures as an adult. He denies any history of trauma. Patient says he's been compliant with Lantus History reviewed. No pertinent past surgical history.  Family History: Patient denies having any family history of mental illness or suicide  Social History: Patient is currently living in a trailer with his girlfriend and their 5 month old child. The patient and his fianc both received disability for mental illness. He had no transportation and patient has not been following up with psychiatry. He denies any history of legal problems.    Past Medical History:  Past Medical History  Diagnosis Date  . Diabetes mellitus   . Sleeping difficulties   . Anxiety   . Depression    History reviewed. No pertinent past surgical history. Family History: History reviewed. No pertinent family history.  Social History:  History  Alcohol Use  . Yes     History  Drug Use  . Yes  . Special: "Crack" cocaine, Marijuana     Hospital Course:    Schizophrenia: pt was started  on abilify 20 mg po q day.  He agreed to take Abilify maintenna injection to improve compliance (given on 06/04/15).  Insomnia: continue  trazodone 150 mg by mouth daily at bedtime qhs prn  Diabetes the patient will be continued on Lantus 40 units by mouth daily at bedtime. I have ordered supplemental insulin as well. Continue Glucophage 500 mg by mouth twice a day. Diet carb modified  Metabolic syndrome  screening. Lipid profile and TSH are normal, hemoglobin A1c 9, prolactin 74.    Discharge follow-up he will continue to follow-up with [facility]  Will  d/c home today  On discharge  the patient reported resolution of paranoia and auditory and visual hallucinations. The patient did not appear to be suspicious or  Delusional.. He describes his mood as good, his affect was bright and reactive. The patient was compliant with medications. He was participating in programming. He denied having problems with appetite, sleep and energy or concentration. He denied suicidality or homicidality. He denied side effects from medications. He denied having any physical complaints.  There was no need for seclusion, restraints or forced medications. The patient did not display any disruptive or unsafe behaviors here in the unit.    Physical Findings: AIMS: Facial and Oral Movements Muscles of Facial Expression: None, normal Lips and Perioral Area: None, normal Jaw: None, normal Tongue: None, normal,Extremity Movements Upper (arms, wrists, hands, fingers): None, normal Lower (legs, knees, ankles, toes): None, normal, Trunk Movements Neck, shoulders, hips: None, normal, Overall Severity Severity of abnormal movements (highest score from questions above): None, normal Incapacitation due to abnormal movements: None, normal Patient's awareness of abnormal movements (rate only patient's report): No Awareness, Dental Status Current problems with teeth and/or dentures?: No Does patient usually wear dentures?: No  CIWA:  CIWA-Ar Total: 0 COWS:  COWS Total Score: 1  Musculoskeletal: Strength & Muscle Tone: within normal limits Gait & Station: normal Patient leans: N/A  Psychiatric Specialty Exam: Review of Systems  Constitutional: Negative.   HENT: Negative.   Eyes: Negative.   Respiratory: Negative.   Cardiovascular: Negative.   Gastrointestinal: Negative.   Genitourinary: Negative.   Musculoskeletal:  Negative.   Skin: Negative.   Neurological: Negative.   Endo/Heme/Allergies: Negative.   Psychiatric/Behavioral: Negative.     Blood pressure 140/90, pulse 79, temperature 98.2 F (36.8 C), temperature source Oral, resp. rate 20, height '6\' 4"'$  (1.93 m), weight 105.688 kg (233 lb).Body mass index is 28.37 kg/(m^2).  General Appearance: Well Groomed  Engineer, water::  Good  Speech:  Clear and Coherent  Volume:  Normal  Mood:  Euthymic  Affect:  Appropriate  Thought Process:  Logical  Orientation:  Full (Time, Place, and Person)  Thought Content:  Hallucinations: None  Suicidal Thoughts:  No  Homicidal Thoughts:  No  Memory:  Immediate;   Good Recent;   Good Remote;   Good  Judgement:  Fair  Insight:  Fair  Psychomotor Activity:  Normal  Concentration:  Good  Recall:  Good  Fund of Knowledge:Good  Language: Good  Akathisia:  No  Handed:    AIMS (if indicated):     Assets:  Armed forces logistics/support/administrative officer Social Support  ADL's:  Intact  Cognition: WNL  Sleep:  Number of Hours: 6.5   Have you used any form of tobacco in the last 30 days? (Cigarettes, Smokeless Tobacco, Cigars, and/or Pipes): Yes  Has this patient used any form of tobacco in the last 30 days? (Cigarettes, Smokeless Tobacco, Cigars, and/or Pipes) Yes, Yes, A  prescription for an FDA-approved tobacco cessation medication was offered at discharge and the patient refused  Blood Alcohol level:  Lab Results  Component Value Date   [facility] 14* 06/02/2015   ETH 30* 79/05/4095    Metabolic Disorder Labs:  Lab Results  Component Value Date   HGBA1C 9.0* 06/03/2015   Lab Results  Component Value Date   PROLACTIN 74.0* 06/03/2015   Lab Results  Component Value Date   CHOL 145 06/03/2015   TRIG 69 06/03/2015   HDL 34* 06/03/2015   CHOLHDL 4.3 06/03/2015   VLDL 14 06/03/2015   LDLCALC 97 06/03/2015   Results for CESAR, ROGERSON (MRN [id number]) as of 06/06/2015 14:45  Ref. Range 06/02/2015 20:25 06/03/2015 07:15  Sodium Latest  Ref Range: 135-145 mmol/L 140   Potassium Latest Ref Range: 3.5-5.1 mmol/L 3.8   Chloride Latest Ref Range: 101-111 mmol/L 107   CO2 Latest Ref Range: 22-32 mmol/L 24   BUN Latest Ref Range: 6-20 mg/dL 10   Creatinine Latest Ref Range: 0.61-1.24 mg/dL 0.83   Calcium Latest Ref Range: 8.9-10.3 mg/dL 9.0   EGFR (Non-African Amer.) Latest Ref Range: >60 mL/min >60   EGFR (African American) Latest Ref Range: >60 mL/min >60   Glucose Latest Ref Range: 65-99 mg/dL 197 (H)   Anion gap Latest Ref Range: 5-15  9   Alkaline Phosphatase Latest Ref Range: 38-126 U/L 90   Albumin Latest Ref Range: 3.5-5.0 g/dL 4.2   AST Latest Ref Range: 15-41 U/L 18   ALT Latest Ref Range: 17-63 U/L 18   Total Protein Latest Ref Range: 6.5-8.1 g/dL 7.4   Total Bilirubin Latest Ref Range: 0.3-1.2 mg/dL 0.2 (L)   Cholesterol Latest Ref Range: 0-200 mg/dL  145  Triglycerides Latest Ref Range: <150 mg/dL  69  HDL Cholesterol Latest Ref Range: >40 mg/dL  34 (L)  LDL (calc) Latest Ref Range: 0-99 mg/dL  97  VLDL Latest Ref Range: 0-40 mg/dL  14  Total CHOL/HDL Ratio Latest Units: RATIO  4.3  WBC Latest Ref Range: 3.8-10.6 K/uL 9.2   RBC Latest Ref Range: 4.40-5.90 MIL/uL 5.32   Hemoglobin Latest Ref Range: 13.0-18.0 g/dL 16.1   HCT Latest Ref Range: 40.0-52.0 % 48.2   MCV Latest Ref Range: 80.0-100.0 fL 90.6   MCH Latest Ref Range: 26.0-34.0 pg 30.4   MCHC Latest Ref Range: 32.0-36.0 g/dL 33.5   RDW Latest Ref Range: 11.5-14.5 % 14.3   Platelets Latest Ref Range: 150-440 K/uL 171   Acetaminophen (Tylenol), S Latest Ref Range: 10-30 ug/mL <68 (L)   Salicylate Lvl Latest Ref Range: 2.8-30.0 mg/dL <4.0   Prolactin Latest Ref Range: 4.0-15.2 ng/mL  74.0 (H)  Hemoglobin A1C Latest Ref Range: 4.0-6.0 %  9.0 (H)  TSH Latest Ref Range: 0.350-4.500 uIU/mL  0.684   See Psychiatric Specialty Exam and Suicide Risk Assessment completed by Attending Physician prior to discharge.  Discharge destination:  Home  Is patient on  multiple antipsychotic therapies at discharge:  Yes,   Do you recommend tapering to monotherapy for antipsychotics?  Yes   Has Patient had three or more failed trials of antipsychotic monotherapy by history:  No  Recommended Plan for Multiple Antipsychotic Therapies: Taper to monotherapy as described:  taper off oral abilify over the next 30 days     Medication List    TAKE these medications      Indication   ARIPiprazole 20 MG tablet  Commonly known as:  ABILIFY  Take 1 tablet (20 mg total)  by mouth daily.  Notes to Patient:  Psychosis      ARIPiprazole 400 MG Susr  Inject 400 mg into the muscle every 28 (twenty-eight) days.  Start taking on:  07/04/2015  Notes to Patient:  Psychosis      gabapentin 300 MG capsule  Commonly known as:  NEURONTIN  Take 300 mg by mouth 3 (three) times daily.  Notes to Patient:  Neuropathic pain      insulin glargine 100 UNIT/ML injection  Commonly known as:  LANTUS  Inject 40 Units into the skin every morning.  Notes to Patient:  Diabetes      metFORMIN 500 MG tablet  Commonly known as:  GLUCOPHAGE  Take 1 tablet (500 mg total) by mouth 2 (two) times daily with a meal.  Notes to Patient:  Diabetes      traZODone 100 MG tablet  Commonly known as:  DESYREL  Take 1 tablet (100 mg total) by mouth at bedtime.  Notes to Patient:  Insomnia        Follow-up Information    Follow up with [facility] On 06/09/2015.   Specialty:  Behavioral Health   Why:  Your hospital follow up appointment will walk in. Walk in hours are Monday through Friday between 8:00am-10:00am.    Contact information:   [place] [address] [phone]        Signed: Hildred Priest, MD 06/06/2015, 2:55 PM

## 2015-06-06 NOTE — Progress Notes (Addendum)
D: Pt denies SI/HI/AVH. Pt is pleasant and cooperative. Patient's affect is flat but brightens upon approach, he appears less anxious and he is interacting with peers and staff appropriately.  A: Pt was offered support and encouragement. Pt was given scheduled medications. Pt was encouraged to attend groups. Q 15 minute checks were done for safety.  R:Pt attends groups and interacts well with peers and staff. Pt is taking medication. Pt has no complaints.Pt receptive to treatment and safety maintained on unit.   

## 2015-06-06 NOTE — Tx Team (Signed)
Interdisciplinary Treatment Plan Update (Adult)  Date:  06/06/2015 Time Reviewed:  2:38 PM  Progress in Treatment: Attending groups: Yes. Participating in groups:  Yes. Taking medication as prescribed:  Yes. Tolerating medication:  Yes. Family/Significant othe contact made:  Yes, individual(s) contacted:  mother Patient understands diagnosis:  Yes. Discussing patient identified problems/goals with staff:  Yes. Medical problems stabilized or resolved:  Yes. Denies suicidal/homicidal ideation: Yes. Issues/concerns per patient self-inventory:  Yes. Other:  New problem(s) identified: No, Describe:  NA  Discharge Plan or Barriers: Pt plans to return home and follow up with outpatient.    Reason for Continuation of Hospitalization: Hallucinations Medication stabilization Other; describe paranoia   Comments: Patient reports doing much better with the Abilify. He has been tolerating this medication well. He feels that the voices have decreased. He reports decrease in the paranoia as well. The patient has been seen interacting with peers some. He is frequently seen in the day room and he has been participating in programming. Patient denies suicidality, homicidality. Her having major problems with mood, appetite, energy is sleep or concentration. Patient receive Abilify Mantenna on 3/1  Estimated length of stay: Pt will likely discharge today.   New goal(s): NA  Review of initial/current patient goals per problem list:   1.  Goal(s): Patient will participate in aftercare plan * Met: Yes  * Target date: at discharge * As evidenced by: Patient will participate within aftercare plan AEB aftercare provider and housing plan at discharge being identified.   2.  Goal (s): Patient will demonstrate decreased symptoms of psychosis. * Met: Yes *  Target date: at discharge * As evidenced by: Patient will not endorse signs of psychosis or be deemed stable for discharge by MD.    Attendees: Patient:  Steven Christensen 3/3/20172:38 PM  Family:   3/3/20172:38 PM  Physician:  Dr. Jerilee Hoh   3/3/20172:38 PM  Nursing:   Junita Push, RN  3/3/20172:38 PM  Case Manager:   3/3/20172:38 PM  Counselor:   3/3/20172:38 PM  Other: [place]  3/3/20172:38 PM  Other:  Everitt Amber, [place]  3/3/20172:38 PM  Other:   3/3/20172:38 PM  Other:  3/3/20172:38 PM  Other:  3/3/20172:38 PM  Other:  3/3/20172:38 PM  Other:  3/3/20172:38 PM  Other:  3/3/20172:38 PM  Other:  3/3/20172:38 PM  Other:   3/3/20172:38 PM   Scribe for Treatment Team:   Wray Kearns, MSW, LCSWA  06/06/2015, 2:38 PM

## 2015-06-06 NOTE — Progress Notes (Signed)
Patient denies SI/HI, denies A/V hallucinations. Patient verbalizes understanding of discharge instructions, follow up care and prescriptions. Patient given all belongings from  locker. Patient escorted out by staff, transported by cab. 

## 2015-06-06 NOTE — Plan of Care (Signed)
Problem: Ineffective individual coping Goal: LTG: Patient will report a decrease in negative feelings Outcome: Progressing Patient denies SI/HI.      

## 2015-06-06 NOTE — [facility] Suicide Risk Assessment (Signed)
[facility] INPATIENT:  Family/Significant Other Suicide Prevention Education  Suicide Prevention Education:  Education Completed; Steven Christensen [phone] (wife) has been identified by the patient as the family member/significant other with whom the patient will be residing, and identified as the person(s) who will aid the patient in the event of a mental health crisis (suicidal ideations/suicide attempt).  With written consent from the patient, the family member/significant other has been provided the following suicide prevention education, prior to the and/or following the discharge of the patient.  The suicide prevention education provided includes the following:  Suicide risk factors  Suicide prevention and interventions  National Suicide Hotline telephone number  [facility] assessment telephone number  [facility] Emergency Assistance 911  [facility] and/or Residential Mobile Crisis Unit telephone number  Request made of family/significant other to:  Remove weapons (e.g., guns, rifles, knives), all items previously/currently identified as safety concern.    Remove drugs/medications (over-the-counter, prescriptions, illicit drugs), all items previously/currently identified as a safety concern.  The family member/significant other verbalizes understanding of the suicide prevention education information provided.  The family member/significant other agrees to remove the items of safety concern listed above.  Steven Christensen L Steven Christensen MSW, LCSWA  06/06/2015, 2:12 PM

## 2015-06-06 NOTE — [facility] Group Notes (Signed)
[facility]   06/06/2015 3:07 PM  Participation Quality:  Patient attended group and introduced himself sharing his SMART goal is to "continue to focus on my medication and take them like I'm suppose to and keep going to meetings".   Mood/Affect:  Appropriate  Depression Rating:  2  Anxiety Rating:  5  Thoughts of Suicide:  No Will you contract for safety?   NA  Current AVH:  No  Plan for Discharge/Comments:  Discharge home and has follow up identified  Transportation Means: reports he has transportation  Supports: has family support and outpatient follow up.  Lulu RidingIngle, [place] N[place] T, MSW, LCSWA

## 2015-06-06 NOTE — [facility] Group Notes (Signed)
[facility] Group Notes:  (Nursing/MHT/Case Management/Adjunct)  Date:  06/06/2015  Time:  2:55 PM  Type of Therapy:  Psychoeducational Skills  Participation Level:  Active  Participation Quality:  Appropriate, Sharing and Supportive  Affect:  Appropriate  Cognitive:  Appropriate  Insight:  Appropriate  Engagement in Group:  Supportive  Modes of Intervention:  Discussion and Education  Summary of Progress/Problems:  Steven Christensen 06/06/2015, 2:55 PM

## 2015-06-06 NOTE — Progress Notes (Signed)
Recreation Therapy Notes  Date: 03.03.17 Time: 3:00 pm Location: Craft Room  Group Topic: Problem solving, Teamwork, Communication  Goal Area(s) Addresses:  Patient will effectively work with peer towards shared goal. Patient will identify skills used to make activity successful. Patient will identify benefit of use group skills effectively post d/c.  Behavioral Response: Arrived late, Attentive, Left early  Intervention: [employer] Tower  Activity: Patients were given 15 pipe cleaners and instructed to build a free standing tower. After approximately 3-5 minutes, patients were told to put their dominant hand behind their back. After approximately 3-5 minutes, patients were told to stop talking to each other.   Education: LRT educated patients on healthy support systems.  Education Outcome: Patient left before LRT educated group.   Clinical Observations/Feedback: Patient worked with peers to build tower however peers were not putting forth effort. Patient was frustrated and stopped working with them. Patient did not contribute to group discussion. Patient left group at approximately 3:40 pm  Jacquelynn CreeGreene,Turner Kunzman M, LRT/CTRS 06/06/2015 4:29 PM

## 2015-06-06 NOTE — [facility] Group Notes (Signed)
[facility] Group Notes:  (Nursing/MHT/Case Management/Adjunct)  Date:  06/06/2015  Time:  1:31 AM  Type of Therapy:  Group Therapy  Participation Level:  Active  Participation Quality:  Appropriate  Affect:  Appropriate  Cognitive:  Appropriate  Insight:  Appropriate  Engagement in Group:  Engaged  Modes of Intervention:  n/a  Summary of Progress/Problems:  Steven Christensen 06/06/2015, 1:31 AM

## 2015-06-06 NOTE — Progress Notes (Signed)
Recreation Therapy Notes  INPATIENT RECREATION TR PLAN  Patient Details Name: Steven Christensen MRN: [id number] DOB: 06/13/1970 Today's Date: 06/06/2015  Rec Therapy Plan Is patient appropriate for Therapeutic Recreation?: Yes Treatment times per week: At least once a week TR Treatment/Interventions: 1:1 session, Group participation (Comment) (Appropriate participation in daily recreation therapy tx)  Discharge Criteria Pt will be discharged from therapy if:: Treatment goals are met, Discharged Treatment plan/goals/alternatives discussed and agreed upon by:: Patient/family  Discharge Summary Short term goals set: See Care Plan Short term goals met: Complete Progress toward goals comments: One-to-one attended Which groups?: Social skills, Leisure education, Self-esteem One-to-one attended: Stress management Reason goals not met: N/A Therapeutic equipment acquired: None Reason patient discharged from therapy: Treatment goals met Pt/family agrees with progress & goals achieved: Yes Date patient discharged from therapy: 06/06/15   , M, LRT/CTRS 06/06/2015, 4:37 PM  
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
## 2015-06-06 NOTE — [facility] Suicide Risk Assessment (Signed)
[facility]   Principal Problem: Schizophrenia [facility](HCC) Discharge Diagnoses:  Patient Active Problem List   Diagnosis Date Noted  . Schizophrenia (HCC) [F20.9] 06/03/2015  . Diabetes (HCC) [E11.9] 06/02/2015  . Tobacco use disorder [F17.200] 06/02/2015  . Hemorrhoids [K64.9] 08/02/2013    Total Time spent with patient: 30 minutes  Psychiatric Specialty Exam: ROS                                                         Mental Status Per Nursing Assessment::   On Admission:     Demographic Factors:  Male  Loss Factors: Financial problems/change in socioeconomic status  Historical Factors: Impulsivity  Risk Reduction Factors:   Living with another person, especially a relative and Positive social support  Continued Clinical Symptoms:  Previous Psychiatric Diagnoses and Treatments Medical Diagnoses and Treatments/Surgeries  Cognitive Features That Contribute To Risk:  None    Suicide Risk:  Minimal: No identifiable suicidal ideation.  Patients presenting with no risk factors but with morbid ruminations; may be classified as minimal risk based on the severity of the depressive symptoms    Jimmy FootmanHernandez-Gonzalez,  Reola Buckles, MD 06/06/2015, 2:03 PM

## 2016-01-12 ENCOUNTER — Encounter (HOSPITAL_COMMUNITY): Payer: Self-pay | Admitting: Emergency Medicine

## 2016-01-12 ENCOUNTER — Emergency Department (HOSPITAL_COMMUNITY)
Admission: EM | Admit: 2016-01-12 | Discharge: 2016-01-13 | Disposition: A | Discharge status: Home / Self Care | Payer: Medicaid Other | Attending: Emergency Medicine | Admitting: Emergency Medicine

## 2016-01-12 DIAGNOSIS — Z794 Long term (current) use of insulin: Secondary | ICD-10-CM | POA: Insufficient documentation

## 2016-01-12 DIAGNOSIS — F1010 Alcohol abuse, uncomplicated: Secondary | ICD-10-CM | POA: Insufficient documentation

## 2016-01-12 DIAGNOSIS — Z79899 Other long term (current) drug therapy: Secondary | ICD-10-CM | POA: Insufficient documentation

## 2016-01-12 DIAGNOSIS — E119 Type 2 diabetes mellitus without complications: Secondary | ICD-10-CM | POA: Diagnosis not present

## 2016-01-12 DIAGNOSIS — F1414 Cocaine abuse with cocaine-induced mood disorder: Secondary | ICD-10-CM | POA: Diagnosis not present

## 2016-01-12 DIAGNOSIS — F1410 Cocaine abuse, uncomplicated: Secondary | ICD-10-CM | POA: Insufficient documentation

## 2016-01-12 DIAGNOSIS — R45850 Homicidal ideations: Secondary | ICD-10-CM | POA: Diagnosis present

## 2016-01-12 DIAGNOSIS — F17210 Nicotine dependence, cigarettes, uncomplicated: Secondary | ICD-10-CM | POA: Insufficient documentation

## 2016-01-12 DIAGNOSIS — F22 Delusional disorders: Secondary | ICD-10-CM

## 2016-01-12 DIAGNOSIS — Z5181 Encounter for therapeutic drug level monitoring: Secondary | ICD-10-CM | POA: Insufficient documentation

## 2016-01-12 DIAGNOSIS — F600 Paranoid personality disorder: Secondary | ICD-10-CM | POA: Diagnosis not present

## 2016-01-12 DIAGNOSIS — F14159 Cocaine abuse with cocaine-induced psychotic disorder, unspecified: Secondary | ICD-10-CM | POA: Diagnosis present

## 2016-01-12 HISTORY — DX: Bipolar disorder, unspecified: F31.9

## 2016-01-12 HISTORY — DX: Schizophrenia, unspecified: F20.9

## 2016-01-12 LAB — COMPREHENSIVE METABOLIC PANEL
ALBUMIN: 4.4 g/dL (ref 3.5–5.0)
ALT: 19 U/L (ref 17–63)
AST: 18 U/L (ref 15–41)
Alkaline Phosphatase: 93 U/L (ref 38–126)
Anion gap: 11 (ref 5–15)
BUN: 11 mg/dL (ref 6–20)
CHLORIDE: 106 mmol/L (ref 101–111)
CO2: 20 mmol/L — ABNORMAL LOW (ref 22–32)
Calcium: 9.1 mg/dL (ref 8.9–10.3)
Creatinine, Ser: 0.8 mg/dL (ref 0.61–1.24)
GFR calc Af Amer: >60 mL/min (ref >60)
GFR calc non Af Amer: >60 mL/min (ref >60)
GLUCOSE: 312 mg/dL — AB (ref 65–99)
POTASSIUM: 3.7 mmol/L (ref 3.5–5.1)
SODIUM: 137 mmol/L (ref 135–145)
Total Bilirubin: <0.1 mg/dL — ABNORMAL LOW (ref 0.3–1.2)
Total Protein: 7.8 g/dL (ref 6.5–8.1)

## 2016-01-12 LAB — ACETAMINOPHEN LEVEL: Acetaminophen (Tylenol), Serum: <10 ug/mL — ABNORMAL LOW (ref 10–30)

## 2016-01-12 LAB — CBC
HEMATOCRIT: 45.4 % (ref 39.0–52.0)
Hemoglobin: 16.2 g/dL (ref 13.0–17.0)
MCH: 31.6 pg (ref 26.0–34.0)
MCHC: 35.7 g/dL (ref 30.0–36.0)
MCV: 88.7 fL (ref 78.0–100.0)
PLATELETS: 155 10*3/uL (ref 150–400)
RBC: 5.12 MIL/uL (ref 4.22–5.81)
RDW: 13.9 % (ref 11.5–15.5)
WBC: 9.3 10*3/uL (ref 4.0–10.5)

## 2016-01-12 LAB — ETHANOL: Alcohol, Ethyl (B): 44 mg/dL — ABNORMAL HIGH (ref <5)

## 2016-01-12 LAB — SALICYLATE LEVEL: Salicylate Lvl: <7 mg/dL (ref 2.8–30.0)

## 2016-01-12 NOTE — ED Notes (Signed)
Bed: [facility]4 Expected date:  Expected time:  Means of arrival:  Comments: 

## 2016-01-12 NOTE — ED Notes (Signed)
Pt has been changed into scrubs and belonging collected (including wallet) and wanded by security

## 2016-01-12 NOTE — ED Triage Notes (Signed)
Pt BIB [facility] after his gf called stating that he was paranoid and drinking/smoking marijuana. Pt thinks that people are driving by 'throwing up gang signs' and he keeps hearing voices telling him to 'pick up a gun and kill people.' Alert.

## 2016-01-13 DIAGNOSIS — F17210 Nicotine dependence, cigarettes, uncomplicated: Secondary | ICD-10-CM

## 2016-01-13 DIAGNOSIS — Z79899 Other long term (current) drug therapy: Secondary | ICD-10-CM

## 2016-01-13 DIAGNOSIS — F14159 Cocaine abuse with cocaine-induced psychotic disorder, unspecified: Secondary | ICD-10-CM | POA: Diagnosis present

## 2016-01-13 DIAGNOSIS — F1414 Cocaine abuse with cocaine-induced mood disorder: Secondary | ICD-10-CM | POA: Diagnosis not present

## 2016-01-13 LAB — RAPID URINE DRUG SCREEN, HOSP PERFORMED
AMPHETAMINES: NOT DETECTED
BARBITURATES: NOT DETECTED
BENZODIAZEPINES: NOT DETECTED
Cocaine: POSITIVE — AB
Opiates: NOT DETECTED
TETRAHYDROCANNABINOL: NOT DETECTED

## 2016-01-13 LAB — CBG MONITORING, ED: GLUCOSE-CAPILLARY: 207 mg/dL — AB (ref 65–99)

## 2016-01-13 MED ORDER — ARIPIPRAZOLE 10 MG PO TABS
20.0000 mg | ORAL_TABLET | Freq: Every day | ORAL | Status: DC
  Administered 2016-01-13: 20 mg via ORAL
  Filled 2016-01-13: qty 2

## 2016-01-13 MED ORDER — TRAZODONE HCL 100 MG PO TABS
100.0000 mg | ORAL_TABLET | Freq: Every day | ORAL | Status: DC
  Administered 2016-01-13: 100 mg via ORAL
  Filled 2016-01-13: qty 1

## 2016-01-13 MED ORDER — INSULIN GLARGINE 100 UNIT/ML ~~LOC~~ SOLN
40.0000 [IU] | Freq: Every day | SUBCUTANEOUS | Status: DC
  Administered 2016-01-13: 40 [IU] via SUBCUTANEOUS
  Filled 2016-01-13: qty 0.4

## 2016-01-13 MED ORDER — ACETAMINOPHEN 325 MG PO TABS: 650.0000 mg | ORAL_TABLET | ORAL | Status: DC | PRN

## 2016-01-13 MED ORDER — ONDANSETRON HCL 4 MG PO TABS: 4.0000 mg | ORAL_TABLET | Freq: Three times a day (TID) | ORAL | Status: DC | PRN

## 2016-01-13 MED ORDER — GABAPENTIN 300 MG PO CAPS
300.0000 mg | ORAL_CAPSULE | Freq: Three times a day (TID) | ORAL | Status: DC
  Administered 2016-01-13: 300 mg via ORAL
  Filled 2016-01-13: qty 1

## 2016-01-13 MED ORDER — METFORMIN HCL 500 MG PO TABS
500.0000 mg | ORAL_TABLET | Freq: Two times a day (BID) | ORAL | Status: DC
  Administered 2016-01-13: 500 mg via ORAL
  Filled 2016-01-13: qty 1

## 2016-01-13 MED ORDER — IBUPROFEN 200 MG PO TABS: 600.0000 mg | ORAL_TABLET | Freq: Three times a day (TID) | ORAL | Status: DC | PRN

## 2016-01-13 MED ORDER — ALUM & MAG HYDROXIDE-SIMETH 200-200-20 MG/5ML PO SUSP: 30.0000 mL | ORAL | Status: DC | PRN

## 2016-01-13 MED ORDER — ZOLPIDEM TARTRATE 5 MG PO TABS: 5.0000 mg | ORAL_TABLET | Freq: Every evening | ORAL | Status: DC | PRN

## 2016-01-13 MED ORDER — NICOTINE 21 MG/24HR TD PT24: 21.0000 mg | MEDICATED_PATCH | Freq: Every day | TRANSDERMAL | Status: DC | PRN

## 2016-01-13 NOTE — Consult Note (Signed)
[facility] Consult   Reason for Consult:  Paranoia with cocaine abuse  Referring Physician:  EDP Patient Identification: Steven Christensen MRN:  [id number] Principal Diagnosis: Cocaine abuse with cocaine-induced psychotic disorder [facility]) Diagnosis:   Patient Active Problem List   Diagnosis Date Noted  . Cocaine abuse with cocaine-induced psychotic disorder [facility]) [F14.159] 01/13/2016    Priority: High  . Diabetes ([place]) [E11.9] 06/02/2015  . Tobacco use disorder [F17.200] 06/02/2015  . Hemorrhoids [K64.9] 08/02/2013    Total Time spent with patient: 45 minutes  Subjective:   Steven Christensen is a 45 y.o. male patient does not warrant admission.  HPI:  45 yo male who presented to the ED with paranoia which he contributed to his alcohol use.  However, he has been using cocaine, explained to him the paranoia most likely was the result of the cocaine use.  No paranoia today, no suicidal/homicidal ideations, hallucinations, or withdrawal symptoms.  He would like to go to substance abuse groups at [facility].  Stable for discharge.  Past Psychiatric History: substance abuse  Risk to Self: Suicidal Ideation: No Suicidal Intent: No Is patient at risk for suicide?: No Suicidal Plan?: No Access to Means: No What has been your use of drugs/alcohol within the last 12 months?: ETOH & THC How many times?: 0 Other Self Harm Risks: None Triggers for Past Attempts: None known Intentional Self Injurious Behavior: None Risk to Others: Homicidal Ideation: Yes-Currently Present (Voices telling him to kill others.) Thoughts of Harm to Others: No-Not Currently Present/Within Last 6 Months Current Homicidal Intent: No Current Homicidal Plan: No Access to Homicidal Means: No Identified Victim: Voices tell him to harm othes History of harm to others?: No Assessment of Violence: None Noted Violent Behavior Description: Unknown Does patient have access to weapons?: No Criminal Charges  Pending?: No Does patient have a court date: No Prior Inpatient Therapy: Prior Inpatient Therapy: Yes Prior Therapy Dates: 05/2015 Prior Therapy Facilty/Provider(s): [facility] Reason for Treatment: paranoia Prior Outpatient Therapy: Prior Outpatient Therapy: No Prior Therapy Dates: Unknown Prior Therapy Facilty/Provider(s): Unknown Reason for Treatment: Unknown Does patient have an ACCT team?: No Does patient have Intensive In-House Services?  : No Does patient have [facility] services? : No Does patient have P4CC services?: No  Past Medical History:  Past Medical History:  Diagnosis Date  . Anxiety   . Bipolar affective ([name])   . Depression   . Diabetes mellitus   . Schizophrenia ([name])   . Sleeping difficulties    History reviewed. No pertinent surgical history. Family History: History reviewed. No pertinent family history. Family Psychiatric  History: none Social History:  History  Alcohol Use  . Yes     History  Drug Use  . Types: "Crack" cocaine, Marijuana    Social History   Social History  . Marital status: Single    Spouse name: N/A  . Number of children: N/A  . Years of education: N/A   Social History Main Topics  . Smoking status: Current Every Day Smoker    Packs/day: 1.00    Years: 35.00    Types: Cigarettes  . Smokeless tobacco: None  . Alcohol use Yes  . Drug use:     Types: "Crack" cocaine, Marijuana  . Sexual activity: Yes    Birth control/ protection: None   Other Topics Concern  . None   Social History Narrative  . None   Additional Social History:    Allergies:  No Known Allergies  Labs:  Results for  orders placed or performed during the hospital encounter of 01/12/16 (from the past 48 hour(s))  Comprehensive metabolic panel     Status: Abnormal   Collection Time: 01/12/16 10:13 PM  Result Value Ref Range   Sodium 137 135 - 145 mmol/L   Potassium 3.7 3.5 - 5.1 mmol/L   Chloride 106 101 - 111 mmol/L   CO2 20 (L) 22 - 32 mmol/L    Glucose, Bld 312 (H) 65 - 99 mg/dL   BUN 11 6 - 20 mg/dL   Creatinine, Ser 0.80 0.61 - 1.24 mg/dL   Calcium 9.1 8.9 - 10.3 mg/dL   Total Protein 7.8 6.5 - 8.1 g/dL   Albumin 4.4 3.5 - 5.0 g/dL   AST 18 15 - 41 U/L   ALT 19 17 - 63 U/L   Alkaline Phosphatase 93 38 - 126 U/L   Total Bilirubin <0.1 (L) 0.3 - 1.2 mg/dL   GFR calc non Af Amer >60 >60 mL/min   GFR calc Af Amer >60 >60 mL/min    Comment: (NOTE) The eGFR has been calculated using the CKD EPI equation. This calculation has not been validated in all clinical situations. eGFR's persistently <60 mL/min signify possible Chronic Kidney Disease.    Anion gap 11 5 - 15  Ethanol     Status: Abnormal   Collection Time: 01/12/16 10:13 PM  Result Value Ref Range   Alcohol, Ethyl (B) 44 (H) <5 mg/dL    Comment:        LOWEST DETECTABLE LIMIT FOR SERUM ALCOHOL IS 5 mg/dL FOR MEDICAL PURPOSES ONLY   Salicylate level     Status: None   Collection Time: 01/12/16 10:13 PM  Result Value Ref Range   Salicylate Lvl <0.9 2.8 - 30.0 mg/dL  Acetaminophen level     Status: Abnormal   Collection Time: 01/12/16 10:13 PM  Result Value Ref Range   Acetaminophen (Tylenol), Serum <10 (L) 10 - 30 ug/mL    Comment:        THERAPEUTIC CONCENTRATIONS VARY SIGNIFICANTLY. A RANGE OF 10-30 ug/mL MAY BE AN EFFECTIVE CONCENTRATION FOR MANY PATIENTS. HOWEVER, SOME ARE BEST TREATED AT CONCENTRATIONS OUTSIDE THIS RANGE. ACETAMINOPHEN CONCENTRATIONS >150 ug/mL AT 4 HOURS AFTER INGESTION AND >50 ug/mL AT 12 HOURS AFTER INGESTION ARE OFTEN ASSOCIATED WITH TOXIC REACTIONS.   cbc     Status: None   Collection Time: 01/12/16 10:13 PM  Result Value Ref Range   WBC 9.3 4.0 - 10.5 K/uL   RBC 5.12 4.22 - 5.81 MIL/uL   Hemoglobin 16.2 13.0 - 17.0 g/dL   HCT 45.4 39.0 - 52.0 %   MCV 88.7 78.0 - 100.0 fL   MCH 31.6 26.0 - 34.0 pg   MCHC 35.7 30.0 - 36.0 g/dL   RDW 13.9 11.5 - 15.5 %   Platelets 155 150 - 400 K/uL  CBG monitoring, ED     Status:  Abnormal   Collection Time: 01/13/16  7:44 AM  Result Value Ref Range   Glucose-Capillary 207 (H) 65 - 99 mg/dL  Rapid urine drug screen (hospital performed)     Status: Abnormal   Collection Time: 01/13/16  7:51 AM  Result Value Ref Range   Opiates NONE DETECTED NONE DETECTED   Cocaine POSITIVE (A) NONE DETECTED   Benzodiazepines NONE DETECTED NONE DETECTED   Amphetamines NONE DETECTED NONE DETECTED   Tetrahydrocannabinol NONE DETECTED NONE DETECTED   Barbiturates NONE DETECTED NONE DETECTED    Comment:  DRUG SCREEN FOR MEDICAL PURPOSES ONLY.  IF CONFIRMATION IS NEEDED FOR ANY PURPOSE, NOTIFY LAB WITHIN 5 DAYS.        LOWEST DETECTABLE LIMITS FOR URINE DRUG SCREEN Drug Class       Cutoff (ng/mL) Amphetamine      1000 Barbiturate      200 Benzodiazepine   096 Tricyclics       283 Opiates          300 Cocaine          300 THC              50     Current Facility-Administered Medications  Medication Dose Route Frequency Provider Last Rate Last Dose  . acetaminophen (TYLENOL) tablet 650 mg  650 mg Oral Q4H PRN Daleen Bo, MD      . alum & mag hydroxide-simeth (MAALOX/MYLANTA) 200-200-20 MG/5ML suspension 30 mL  30 mL Oral PRN Daleen Bo, MD      . ARIPiprazole (ABILIFY) tablet 20 mg  20 mg Oral Daily Daleen Bo, MD   20 mg at 01/13/16 0929  . gabapentin (NEURONTIN) capsule 300 mg  300 mg Oral TID Daleen Bo, MD   300 mg at 01/13/16 0929  . ibuprofen (ADVIL,MOTRIN) tablet 600 mg  600 mg Oral Q8H PRN Daleen Bo, MD      . insulin glargine (LANTUS) injection 40 Units  40 Units Subcutaneous Daily Daleen Bo, MD   40 Units at 01/13/16 0929  . metFORMIN (GLUCOPHAGE) tablet 500 mg  500 mg Oral BID WC Daleen Bo, MD   500 mg at 01/13/16 0802  . nicotine (NICODERM CQ - dosed in mg/24 hours) patch 21 mg  21 mg Transdermal Daily PRN Daleen Bo, MD      . ondansetron [facility]) tablet 4 mg  4 mg Oral Q8H PRN Daleen Bo, MD      . traZODone (DESYREL) tablet  100 mg  100 mg Oral QHS Daleen Bo, MD   100 mg at 01/13/16 0136  . zolpidem (AMBIEN) tablet 5 mg  5 mg Oral QHS PRN Daleen Bo, MD       Current Outpatient Prescriptions  Medication Sig Dispense Refill  . ARIPiprazole (ABILIFY) 20 MG tablet Take 1 tablet (20 mg total) by mouth daily. 30 tablet 0  . ARIPiprazole 400 MG SUSR Inject 400 mg into the muscle every 28 (twenty-eight) days. 1 each 0  . gabapentin (NEURONTIN) 300 MG capsule Take 1 capsule (300 mg total) by mouth 3 (three) times daily. 90 capsule 0  . insulin glargine (LANTUS) 100 UNIT/ML injection Inject 40 Units into the skin every morning.     . metFORMIN (GLUCOPHAGE) 500 MG tablet Take 1 tablet (500 mg total) by mouth 2 (two) times daily with a meal. 60 tablet 0  . traZODone (DESYREL) 100 MG tablet Take 1 tablet (100 mg total) by mouth at bedtime. 30 tablet 0  . Vitamin D, Ergocalciferol, (DRISDOL) 50000 units CAPS capsule Take 50,000 Units by mouth every 7 (seven) days.      Musculoskeletal: Strength & Muscle Tone: within normal limits Gait & Station: normal Patient leans: N/A  Psychiatric Specialty Exam: Physical Exam  Constitutional: He is oriented to person, place, and time. He appears well-developed and well-nourished.  HENT:  Head: Normocephalic.  Neck: Normal range of motion.  Respiratory: Effort normal.  Musculoskeletal: Normal range of motion.  Neurological: He is alert and oriented to person, place, and time.  Skin: Skin is warm.  Psychiatric:  His speech is normal and behavior is normal. Judgment normal. His mood appears anxious. Thought content is paranoid. Cognition and memory are normal.    Review of Systems  Constitutional: Negative.   HENT: Negative.   Eyes: Negative.   Respiratory: Negative.   Cardiovascular: Negative.   Gastrointestinal: Negative.   Genitourinary: Negative.   Musculoskeletal: Negative.   Skin: Negative.   Neurological: Negative.   Endo/Heme/Allergies: Negative.    Psychiatric/Behavioral: Positive for substance abuse. The patient is nervous/anxious.     Blood pressure 145/74, pulse 72, temperature 97.8 F (36.6 C), temperature source Oral, resp. rate 18, SpO2 99 %.There is no height or weight on file to calculate BMI.  General Appearance: Casual  Eye Contact:  Good  Speech:  Normal Rate  Volume:  Normal  Mood:  Anxious, mild  Affect:  Congruent  Thought Process:  Coherent and Descriptions of Associations: Intact  Orientation:  Full (Time, Place, and Person)  Thought Content:  WDL  Suicidal Thoughts:  No  Homicidal Thoughts:  No  Memory:  Immediate;   Good Recent;   Fair Remote;   Good  Judgement:  Fair  Insight:  Fair  Psychomotor Activity:  Normal  Concentration:  Concentration: Good and Attention Span: Good  Recall:  [place] of Knowledge:  Fair  Language:  Good  Akathisia:  No  Handed:  Right  AIMS (if indicated):     Assets:  Housing Leisure Time Physical Health Resilience Social Support  ADL's:  Intact  Cognition:  WNL  Sleep:        Treatment Plan Summary: Daily contact with patient to assess and evaluate symptoms and progress in treatment, Medication management and Plan cocaine abuse with cocaine induced mood disorder:  -Crisis stabilization -Medication management:  Continued medical medications along with Gabapentin 300 mg TID for withdrawal symptoms, Abilify 20 mg daily for mood stabilization, and Trazodone 100 mg at bedtime for sleep. -Individual and substance abuse counseling  Disposition: No evidence of imminent risk to self or others at present.    Waylan Boga, NP 01/13/2016 10:43 AM  Patient seen face-to-face for psychiatric evaluation, chart reviewed and case discussed with the physician extender and developed treatment plan. Reviewed the information documented and agree with the treatment plan. Corena Pilgrim, MD

## 2016-01-13 NOTE — BH Assessment (Addendum)
Tele Assessment Note   Steven Christensen is an 45 y.o. male.  -Clinician reviewed note by Dr. Effie Shy.  Patient feels like people are out to kill him, and that they are real people, he denies hallucinations. He does not know who the people are. Feels like it's probably aggravated, by his alcohol intake. He lives with his girlfriend with whom he gets along. He has been smoking marijuana. He presents by EMS for evaluation.  Patient was very sleepy when being assessed.  He was not able to give much information.  Patient says he does not wish to kill himself.  He has no real HI but he is hearing voices telling him to hear others.  Patient believes that people may be driving by house and being threatening to him.  No visual hallucinations however.  Patient's girlfriend had called EMS for him.  Patient is not able to elaborate on ETOH or THC use.  He did say he usually drank about two 40's when he drinks.  Patient was at [facility] in February '17.  It is unknown about whether patient has outpatient care.  -Clinician reviewed patient care from Donell Sievert, [place].  He recommended inpatient care for patient.  [facility] has no beds available at this time.  TTS to seek placement.  Diagnosis: Schizoaffective d/o  Past Medical History:  Past Medical History:  Diagnosis Date  . Anxiety   . Bipolar affective (HCC)   . Depression   . Diabetes mellitus   . Schizophrenia ([facility])   . Sleeping difficulties     History reviewed. No pertinent surgical history.  Family History: History reviewed. No pertinent family history.  Social History:  reports that he has been smoking Cigarettes.  He has a 35.00 pack-year smoking history. He does not have any smokeless tobacco history on file. He reports that he drinks alcohol. He reports that he uses drugs, including "Crack" cocaine and Marijuana.  Additional Social History:  Alcohol / Drug Use Pain Medications: See PTA medications Prescriptions: See PTA medications Over the  Counter: See PTA medications. History of alcohol / drug use?: Yes Substance #1 Name of Substance 1: ETOH 1 - Age of First Use: Teens 1 - Amount (size/oz): Two 40s at a time 1 - Frequency: Varies 1 - Duration: unknown 1 - Last Use / Amount: 10/09  Unknown amount  CIWA: CIWA-Ar BP: 136/89 Pulse Rate: 72 COWS:    PATIENT STRENGTHS: (choose at least two) Average or above average intelligence Capable of independent living Communication skills Supportive family/friends  Allergies: No Known Allergies  Home Medications:  (Not in a hospital admission)  OB/GYN Status:  No LMP for male patient.  General Assessment Data [facility] of [facility]: [facility] TTS Assessment: In system Is this a Tele or Face-to-Face Assessment?: Face-to-Face Is this an Initial Assessment or a Re-assessment for this encounter?: Initial Assessment Marital status: Single Is patient pregnant?: No Pregnancy Status: No Living Arrangements: Spouse/significant other Can pt return to current living arrangement?: Yes Admission Status: Voluntary Is patient capable of signing voluntary admission?: Yes Referral Source: Self/Family/Friend Insurance type: MCD     Crisis Care Plan Living Arrangements: Spouse/significant other Name of Psychiatrist: Unknown Name of Therapist: Unknown  Education Status Is patient currently in school?: No  Risk to self with the past 6 months Suicidal Ideation: No Has patient been a risk to self within the past 6 months prior to admission? : No Suicidal Intent: No Has patient had any suicidal intent within the past 6 months prior  to admission? : No Is patient at risk for suicide?: No Suicidal Plan?: No Has patient had any suicidal plan within the past 6 months prior to admission? : No Access to Means: No What has been your use of drugs/alcohol within the last 12 months?: ETOH & THC Previous Attempts/Gestures: No How many times?: 0 Other Self Harm Risks: None Triggers for Past  Attempts: None known Intentional Self Injurious Behavior: None Family Suicide History: No Recent stressful life event(s): Turmoil (Comment) Persecutory voices/beliefs?: Yes Depression: Yes Depression Symptoms: Despondent, Isolating, Feeling worthless/self pity Substance abuse history and/or treatment for substance abuse?: No Suicide prevention information given to non-admitted patients: Not applicable  Risk to Others within the past 6 months Homicidal Ideation: Yes-Currently Present (Voices telling him to kill others.) Does patient have any lifetime risk of violence toward others beyond the six months prior to admission? : No Thoughts of Harm to Others: No-Not Currently Present/Within Last 6 Months Current Homicidal Intent: No Current Homicidal Plan: No Access to Homicidal Means: No Identified Victim: Voices tell him to harm othes History of harm to others?: No Assessment of Violence: None Noted Violent Behavior Description: Unknown Does patient have access to weapons?: No Criminal Charges Pending?: No Does patient have a court date: No Is patient on probation?: Unknown  Psychosis Hallucinations: Auditory (Voices telling him to harm others) Delusions: Persecutory (He is being followed and people are out to harm him.)  Mental Status Report Appearance/Hygiene: Disheveled, In scrubs Eye Contact: Poor Motor Activity: Unremarkable Speech: Soft Level of Consciousness: Drowsy, Sleeping Mood: Sad Affect: Blunted Anxiety Level: Severe Thought Processes: Unable to Assess Judgement: Unable to Assess Orientation: Unable to assess Obsessive Compulsive Thoughts/Behaviors: Unable to Assess  Cognitive Functioning Concentration: Unable to Assess Memory: Unable to Assess IQ: Average Insight: Poor Impulse Control: Poor Appetite: Fair Weight Loss: 0 Weight Gain: 0 Sleep: No Change Total Hours of Sleep: 5 Vegetative Symptoms: None  ADLScreening [facility] Assessment Services) Patient's  cognitive ability adequate to safely complete daily activities?: Yes Patient able to express need for assistance with ADLs?: Yes Independently performs ADLs?: Yes (appropriate for developmental age)  Prior Inpatient Therapy Prior Inpatient Therapy: Yes Prior Therapy Dates: 05/2015 Prior Therapy Facilty/Provider(s): [facility] Reason for Treatment: paranoia  Prior Outpatient Therapy Prior Outpatient Therapy: No Prior Therapy Dates: Unknown Prior Therapy Facilty/Provider(s): Unknown Reason for Treatment: Unknown Does patient have an ACCT team?: No Does patient have Intensive In-House Services?  : No Does patient have [facility] services? : No Does patient have P4CC services?: No  ADL Screening (condition at time of admission) Patient's cognitive ability adequate to safely complete daily activities?: Yes Is the patient deaf or have difficulty hearing?: No Does the patient have difficulty seeing, even when wearing glasses/contacts?: No Does the patient have difficulty concentrating, remembering, or making decisions?: No Patient able to express need for assistance with ADLs?: Yes Does the patient have difficulty dressing or bathing?: No Independently performs ADLs?: Yes (appropriate for developmental age) Does the patient have difficulty walking or climbing stairs?: No Weakness of Legs: None Weakness of Arms/Hands: None       Abuse/Neglect Assessment (Assessment to be complete while patient is alone) Physical Abuse: Denies Verbal Abuse: Denies Sexual Abuse: Denies Exploitation of patient/patient's resources: Denies Self-Neglect: Denies     Merchant navy officer (For Healthcare) Does patient have an advance directive?: No Would patient like information on creating an advanced directive?: No - patient declined information    Additional Information 1:1 In Past 12 Months?: No CIRT Risk: No  Elopement Risk: No Does patient have medical clearance?: Yes     Disposition:   Disposition Initial Assessment Completed for this Encounter: Yes Disposition of Patient: Other dispositions Other disposition(s): Other (Comment) (Pt to be reviewed by PA)  Beatriz StallionHarvey, Clea Dubach Ray 01/13/2016 2:15 AM

## 2016-01-13 NOTE — BH Assessment (Signed)
[facility] Assessment Progress Note  Per Mojeed Akintayo, MD, this pt does not require psychiatric hospitalization at this time.  Pt is to be discharged from [facility] with referral information for [facility].  This has been included in pt's discharge instructions.  Pt's nurse, Diane, has been notified.  Steven Lindh, MA Triage Specialist [phone]    

## 2016-01-13 NOTE — Discharge Instructions (Signed)
For your ongoing mental health needs, you are advised to follow up with [facility].  New and returning patients are seen at their walk-in clinic.  Walk-in hours are Monday - Friday from 8:00 am - 3:00 pm.  Walk-in patients are seen on a first come, first served basis.  Try to arrive as early as possible for he best chance of being seen the same day: ° °     [facility] °     [address] °     [place], [place] [address] °     [phone] °

## 2016-01-13 NOTE — ED Notes (Signed)
Patient denies SI and VH at this time. Patient reports AH telling him to harm others. Plan of care discussed and patient voices no complaints or concerns at this time. Encouragement and support provided and safety maintain. Q 15 min safety checks in place and video monitoring.

## 2016-01-13 NOTE — ED Notes (Signed)
Pt discharged home. Discharged instructions read to pt who verbalized understanding. All belongings returned to pt who signed for same. Denies SI/HI, is not delusional and not responding to internal stimuli. Escorted pt to the ED exit.    

## 2016-01-13 NOTE — ED Notes (Signed)
Patient escorted to the shower and safety check for contraband and none found.

## 2016-01-13 NOTE — ED Notes (Signed)
[id number] Expected date:  Expected time:  Means of arrival:  Comments: Laurine BlazerWalters

## 2016-01-13 NOTE — [facility] Suicide Risk Assessment (Signed)
Suicide Risk Assessment  Discharge Assessment   [facility]   Principal Problem: Cocaine abuse with cocaine-induced mood disorder [facility](HCC) Discharge Diagnoses:  Patient Active Problem List   Diagnosis Date Noted  . Cocaine abuse with cocaine-induced mood disorder (HCC) [F14.14] 01/13/2016    Priority: High  . Diabetes (HCC) [E11.9] 06/02/2015  . Tobacco use disorder [F17.200] 06/02/2015  . Hemorrhoids [K64.9] 08/02/2013    Total Time spent with patient: 45 minutes   Musculoskeletal: Strength & Muscle Tone: within normal limits Gait & Station: normal Patient leans: N/A  Psychiatric Specialty Exam: Physical Exam  Constitutional: He is oriented to person, place, and time. He appears well-developed and well-nourished.  HENT:  Head: Normocephalic.  Neck: Normal range of motion.  Respiratory: Effort normal.  Musculoskeletal: Normal range of motion.  Neurological: He is alert and oriented to person, place, and time.  Skin: Skin is warm.  Psychiatric: His speech is normal and behavior is normal. Judgment normal. His mood appears anxious. Thought content is paranoid. Cognition and memory are normal.    Review of Systems  Constitutional: Negative.   HENT: Negative.   Eyes: Negative.   Respiratory: Negative.   Cardiovascular: Negative.   Gastrointestinal: Negative.   Genitourinary: Negative.   Musculoskeletal: Negative.   Skin: Negative.   Neurological: Negative.   Endo/Heme/Allergies: Negative.   Psychiatric/Behavioral: Positive for substance abuse. The patient is nervous/anxious.     Blood pressure 145/74, pulse 72, temperature 97.8 F (36.6 C), temperature source Oral, resp. rate 18, SpO2 99 %.There is no height or weight on file to calculate BMI.  General Appearance: Casual  Eye Contact:  Good  Speech:  Normal Rate  Volume:  Normal  Mood:  Anxious, mild  Affect:  Congruent  Thought Process:  Coherent and Descriptions of Associations: Intact   Orientation:  Full (Time, Place, and Person)  Thought Content:  WDL  Suicidal Thoughts:  No  Homicidal Thoughts:  No  Memory:  Immediate;   Good Recent;   Fair Remote;   Good  Judgement:  Fair  Insight:  Fair  Psychomotor Activity:  Normal  Concentration:  Concentration: Good and Attention Span: Good  Recall:  FiservFair  Fund of Knowledge:  Fair  Language:  Good  Akathisia:  No  Handed:  Right  AIMS (if indicated):     Assets:  Housing Leisure Time Physical Health Resilience Social Support  ADL's:  Intact  Cognition:  WNL  Sleep:       Mental Status Per Nursing Assessment::   On Admission:   paranoia with cocaine abuse  Demographic Factors:  Male  Loss Factors: NA  Historical Factors: NA  Risk Reduction Factors:   Living with another person, especially a relative, Positive social support and Positive therapeutic relationship  Continued Clinical Symptoms:  Anxiety, mild  Cognitive Features That Contribute To Risk:  None    Suicide Risk:  Minimal: No identifiable suicidal ideation.  Patients presenting with no risk factors but with morbid ruminations; may be classified as minimal risk based on the severity of the depressive symptoms    Plan Of Care/Follow-up recommendations:  Activity:  as tolerated Diet:  heart healhty diet  Author Hat[place], NP 01/13/2016, 10:51 AM

## 2016-01-13 NOTE — ED Provider Notes (Signed)
[facility] Provider Note   CSN: [id number] Arrival date & time: 01/12/16  2204     History   Chief Complaint Chief Complaint  Patient presents with  . Homicidal    HPI Steven Christensen is a 45 y.o. male.  Patient feels like people are out to kill him, and that they are real people, he denies hallucinations. He does not know who the people are. Feels like it's probably aggravated, by his alcohol intake. He lives with his girlfriend with whom he gets along. He has been smoking marijuana. He presents by EMS for evaluation. He wants to get help. He denies recent illnesses. There are no other known modifying factors.  HPI  Past Medical History:  Diagnosis Date  . Anxiety   . Bipolar affective (HCC)   . Depression   . Diabetes mellitus   . Schizophrenia (HCC)   . Sleeping difficulties     Patient Active Problem List   Diagnosis Date Noted  . Schizophrenia (HCC) 06/03/2015  . Diabetes (HCC) 06/02/2015  . Tobacco use disorder 06/02/2015  . Hemorrhoids 08/02/2013    History reviewed. No pertinent surgical history.     Home Medications    Prior to Admission medications   Medication Sig Start Date End Date Taking? Authorizing Provider  ARIPiprazole (ABILIFY) 20 MG tablet Take 1 tablet (20 mg total) by mouth daily. 06/06/15  Yes Jimmy Footman, MD  ARIPiprazole 400 MG SUSR Inject 400 mg into the muscle every 28 (twenty-eight) days. 07/04/15  Yes Jimmy Footman, MD  gabapentin (NEURONTIN) 300 MG capsule Take 1 capsule (300 mg total) by mouth 3 (three) times daily. 06/06/15  Yes Jimmy Footman, MD  insulin glargine (LANTUS) 100 UNIT/ML injection Inject 40 Units into the skin every morning.    Yes Historical Provider, MD  metFORMIN (GLUCOPHAGE) 500 MG tablet Take 1 tablet (500 mg total) by mouth 2 (two) times daily with a meal. 06/06/15  Yes Jimmy Footman, MD  traZODone (DESYREL) 100 MG tablet Take 1 tablet (100 mg total) by mouth at  bedtime. 06/06/15  Yes Jimmy Footman, MD  Vitamin D, Ergocalciferol, (DRISDOL) 50000 units CAPS capsule Take 50,000 Units by mouth every 7 (seven) days.   Yes Historical Provider, MD    Family History History reviewed. No pertinent family history.  Social History Social History  Substance Use Topics  . Smoking status: Current Every Day Smoker    Packs/day: 1.00    Years: 35.00    Types: Cigarettes  . Smokeless tobacco: Not on file  . Alcohol use Yes     Allergies   Review of patient's allergies indicates no known allergies.   Review of Systems Review of Systems  All other systems reviewed and are negative.    Physical Exam Updated Vital Signs BP 134/75 (BP Location: Left Arm)   Pulse 92   Temp 98.7 F (37.1 C) (Oral)   Resp 18   SpO2 93%   Physical Exam  Constitutional: He is oriented to person, place, and time. He appears well-developed and well-nourished.  HENT:  Head: Normocephalic and atraumatic.  Right Ear: External ear normal.  Left Ear: External ear normal.  Eyes: Conjunctivae and EOM are normal. Pupils are equal, round, and reactive to light.  Neck: Normal range of motion and phonation normal. Neck supple.  Cardiovascular: Normal rate, regular rhythm and normal heart sounds.   Pulmonary/Chest: Effort normal and breath sounds normal. He exhibits no bony tenderness.  Abdominal: Soft. There is no tenderness.  Musculoskeletal: Normal  range of motion.  Neurological: He is alert and oriented to person, place, and time. No cranial nerve deficit or sensory deficit. He exhibits normal muscle tone. Coordination normal.  No dysarthria or aphasia  Skin: Skin is warm, dry and intact.  Psychiatric: His behavior is normal. Judgment and thought content normal.  He appears depressed.  Nursing note and vitals reviewed.    ED Treatments / Results  Labs (all labs ordered are listed, but only abnormal results are displayed) Labs Reviewed  COMPREHENSIVE  METABOLIC PANEL - Abnormal; Notable for the following:       Result Value   CO2 20 (*)    Glucose, Bld 312 (*)    Total Bilirubin <0.1 (*)    All other components within normal limits  ETHANOL - Abnormal; Notable for the following:    Alcohol, Ethyl (B) 44 (*)    All other components within normal limits  ACETAMINOPHEN LEVEL - Abnormal; Notable for the following:    Acetaminophen (Tylenol), Serum <10 (*)    All other components within normal limits  SALICYLATE LEVEL  CBC  RAPID URINE DRUG SCREEN, HOSP PERFORMED    EKG  EKG Interpretation None       Radiology No results found.  Procedures Procedures (including critical care time)  Medications Ordered in ED Medications - No data to display   Initial Impression / Assessment and Plan / ED Course  I have reviewed the triage vital signs and the nursing notes.  Pertinent labs & imaging results that were available during my care of the patient were reviewed by me and considered in my medical decision making (see chart for details).  Clinical Course  Comment By Time  At this time, he is medically cleared for treatment, by psychiatry Mancel BaleElliott Aily Tzeng, MD 10/10 [phone]    Medications - No data to display  Patient Vitals for the past 24 hrs:  BP Temp Temp src Pulse Resp SpO2  01/12/16 2211 134/75 98.7 F (37.1 C) Oral 92 18 93 %    TTS consult   Final Clinical Impressions(s) / ED Diagnoses   Final diagnoses:  Paranoia (HCC)  Alcohol abuse    Nursing Notes Reviewed/ Care Coordinated, and agree without changes. Applicable Imaging Reviewed.  Interpretation of Laboratory Data incorporated into ED treatment  Plan- as per TTS in conjunction with oncoming provider team   New Prescriptions New Prescriptions   No medications on file     Mancel BaleElliott Kadia Abaya, MD 01/13/16 0111

## 2016-01-13 NOTE — ED Notes (Signed)
Pt denies SI/HI, AVH. He states that he is ready to go home. He lives with his girl-friend. She is visiting this morning and he wanted her to be present with the doctors visit. Dr. Jannifer FranklinAkintayo confirmed with him that he wanted her present before discussing his situation and care.

## 2016-01-23 ENCOUNTER — Ambulatory Visit (HOSPITAL_COMMUNITY)
Admission: EM | Admit: 2016-01-23 | Discharge: 2016-01-23 | Disposition: A | Discharge status: Home / Self Care | Payer: Medicaid Other | Attending: Family Medicine | Admitting: Family Medicine

## 2016-01-23 ENCOUNTER — Encounter (HOSPITAL_COMMUNITY): Payer: Self-pay | Admitting: *Deleted

## 2016-01-23 DIAGNOSIS — K602 Anal fissure, unspecified: Secondary | ICD-10-CM | POA: Diagnosis not present

## 2016-01-23 MED ORDER — DILTIAZEM GEL 2 %: 1.0000 "application " | Freq: Two times a day (BID) | CUTANEOUS | 1 refills | Status: DC

## 2016-01-23 NOTE — Discharge Instructions (Signed)
Use medicine as needed and see dr Ezzard Standingnewman if further problems

## 2016-01-23 NOTE — ED Triage Notes (Signed)
Pt  States     Has    Noticed  Bleeding  From  Rectal  Area   With  Pain  And  Irritation     -  He  Reports he  Has had  Problems  Like  This  In  Past  And  Was  Told  He  May  Need  An operation  But  He  Never  Followed  Up

## 2016-01-23 NOTE — ED Provider Notes (Signed)
[facility]    CSN: [id number] Arrival date & time: 01/23/16  1649     History   Chief Complaint Chief Complaint  Patient presents with  . Rectal Bleeding    HPI Steven DenseGerald Christensen is a 45 y.o. male.   The history is provided by the patient.  Rectal Bleeding  Quality:  Bright red Amount:  Moderate Duration:  6 months Chronicity:  Chronic Context: anal fissures and defecation   Similar prior episodes: yes   Relieved by:  None tried Worsened by:  Nothing Ineffective treatments:  None tried Associated symptoms: no abdominal pain and no fever     Past Medical History:  Diagnosis Date  . Anxiety   . Bipolar affective (HCC)   . Depression   . Diabetes mellitus   . Schizophrenia (HCC)   . Sleeping difficulties     Patient Active Problem List   Diagnosis Date Noted  . Cocaine abuse with cocaine-induced mood disorder (HCC) 01/13/2016  . Diabetes (HCC) 06/02/2015  . Tobacco use disorder 06/02/2015  . Hemorrhoids 08/02/2013    History reviewed. No pertinent surgical history.     Home Medications    Prior to Admission medications   Medication Sig Start Date End Date Taking? Authorizing Provider  ARIPiprazole (ABILIFY) 20 MG tablet Take 1 tablet (20 mg total) by mouth daily. 06/06/15   Jimmy FootmanAndrea Hernandez-Gonzalez, MD  ARIPiprazole 400 MG SUSR Inject 400 mg into the muscle every 28 (twenty-eight) days. 07/04/15   Jimmy FootmanAndrea Hernandez-Gonzalez, MD  gabapentin (NEURONTIN) 300 MG capsule Take 1 capsule (300 mg total) by mouth 3 (three) times daily. 06/06/15   Jimmy FootmanAndrea Hernandez-Gonzalez, MD  insulin glargine (LANTUS) 100 UNIT/ML injection Inject 40 Units into the skin every morning.     Historical Provider, MD  metFORMIN (GLUCOPHAGE) 500 MG tablet Take 1 tablet (500 mg total) by mouth 2 (two) times daily with a meal. 06/06/15   Jimmy FootmanAndrea Hernandez-Gonzalez, MD  traZODone (DESYREL) 100 MG tablet Take 1 tablet (100 mg total) by mouth at bedtime. 06/06/15   Jimmy FootmanAndrea Hernandez-Gonzalez,  MD  Vitamin D, Ergocalciferol, (DRISDOL) 50000 units CAPS capsule Take 50,000 Units by mouth every 7 (seven) days.    Historical Provider, MD    Family History History reviewed. No pertinent family history.  Social History Social History  Substance Use Topics  . Smoking status: Current Every Day Smoker    Packs/day: 1.00    Years: 35.00    Types: Cigarettes  . Smokeless tobacco: Not on file  . Alcohol use Yes     Allergies   Review of patient's allergies indicates no known allergies.   Review of Systems Review of Systems  Constitutional: Negative.  Negative for fever.  Gastrointestinal: Positive for anal bleeding, hematochezia and rectal pain. Negative for abdominal pain and nausea.  Genitourinary: Negative.   All other systems reviewed and are negative.    Physical Exam Triage Vital Signs ED Triage Vitals  Enc Vitals Group     BP 01/23/16 1702 131/68     Pulse Rate 01/23/16 1702 72     Resp 01/23/16 1702 12     Temp 01/23/16 1702 98.3 F (36.8 C)     Temp Source 01/23/16 1702 Oral     SpO2 01/23/16 1702 99 %     Weight --      Height --      Head Circumference --      Peak Flow --      Pain Score 01/23/16 1715 4  Pain Loc --      Pain Edu? --      Excl. in GC? --    No data found.   Updated Vital Signs BP 131/68 (BP Location: Left Arm)   Pulse 72   Temp 98.3 F (36.8 C) (Oral)   Resp 12   SpO2 99%   Visual Acuity Right Eye Distance:   Left Eye Distance:   Bilateral Distance:    Right Eye Near:   Left Eye Near:    Bilateral Near:     Physical Exam  Constitutional: He is oriented to person, place, and time. He appears well-developed and well-nourished. He appears distressed.  Abdominal: Soft. Bowel sounds are normal. There is no tenderness.  Genitourinary: Prostate normal. Rectal exam shows fissure. Rectal exam shows no external hemorrhoid and no internal hemorrhoid.  Musculoskeletal: Normal range of motion.  Neurological: He is alert and  oriented to person, place, and time.  Skin: Skin is warm and dry.  Nursing note and vitals reviewed.    UC Treatments / Results  Labs (all labs ordered are listed, but only abnormal results are displayed) Labs Reviewed - No data to display  EKG  EKG Interpretation None       Radiology No results found.  Procedures Procedures (including critical care time)  Medications Ordered in UC Medications - No data to display   Initial Impression / Assessment and Plan / UC Course  I have reviewed the triage vital signs and the nursing notes.  Pertinent labs & imaging results that were available during my care of the patient were reviewed by me and considered in my medical decision making (see chart for details).  Clinical Course      Final Clinical Impressions(s) / UC Diagnoses   Final diagnoses:  None    New Prescriptions New Prescriptions   No medications on file     Linna Hoff, MD 01/23/16 1737

## 2016-04-06 ENCOUNTER — Emergency Department (HOSPITAL_COMMUNITY)
Admission: EM | Admit: 2016-04-06 | Discharge: 2016-04-07 | Disposition: A | Discharge status: Other Acute Inpatient Hospital | Payer: Medicaid Other | Attending: Emergency Medicine | Admitting: Emergency Medicine

## 2016-04-06 ENCOUNTER — Encounter (HOSPITAL_COMMUNITY): Payer: Self-pay | Admitting: Emergency Medicine

## 2016-04-06 DIAGNOSIS — F251 Schizoaffective disorder, depressive type: Secondary | ICD-10-CM | POA: Insufficient documentation

## 2016-04-06 DIAGNOSIS — F17210 Nicotine dependence, cigarettes, uncomplicated: Secondary | ICD-10-CM | POA: Insufficient documentation

## 2016-04-06 DIAGNOSIS — E119 Type 2 diabetes mellitus without complications: Secondary | ICD-10-CM | POA: Insufficient documentation

## 2016-04-06 DIAGNOSIS — R440 Auditory hallucinations: Secondary | ICD-10-CM

## 2016-04-06 DIAGNOSIS — Z79899 Other long term (current) drug therapy: Secondary | ICD-10-CM | POA: Insufficient documentation

## 2016-04-06 DIAGNOSIS — R45851 Suicidal ideations: Secondary | ICD-10-CM

## 2016-04-06 DIAGNOSIS — Z794 Long term (current) use of insulin: Secondary | ICD-10-CM | POA: Insufficient documentation

## 2016-04-06 LAB — CBC WITH DIFFERENTIAL/PLATELET
BASOS ABS: 0 10*3/uL (ref 0.0–0.1)
BASOS PCT: 0 %
Eosinophils Absolute: 0.1 10*3/uL (ref 0.0–0.7)
Eosinophils Relative: 1 %
HCT: 45.7 % (ref 39.0–52.0)
HEMOGLOBIN: 15.8 g/dL (ref 13.0–17.0)
LYMPHS PCT: 26 %
Lymphs Abs: 2.8 10*3/uL (ref 0.7–4.0)
MCH: 30.7 pg (ref 26.0–34.0)
MCHC: 34.6 g/dL (ref 30.0–36.0)
MCV: 88.9 fL (ref 78.0–100.0)
MONO ABS: 0.6 10*3/uL (ref 0.1–1.0)
MONOS PCT: 6 %
NEUTROS ABS: 7.2 10*3/uL (ref 1.7–7.7)
NEUTROS PCT: 67 %
Platelets: 169 10*3/uL (ref 150–400)
RBC: 5.14 MIL/uL (ref 4.22–5.81)
RDW: 13.3 % (ref 11.5–15.5)
WBC: 10.7 10*3/uL — ABNORMAL HIGH (ref 4.0–10.5)

## 2016-04-06 NOTE — ED Triage Notes (Addendum)
Pt here voluntarily with sheriff picked up from home requesting transport here. Pt is paranoid and believes people are out to get him. Reports hearing voices that are saying they are going to kill him. Pt adds he is having thoughts of harming himself. "I just cant take it anymore they are gonna get me."

## 2016-04-07 ENCOUNTER — Inpatient Hospital Stay (HOSPITAL_COMMUNITY)
Admission: AD | Admit: 2016-04-07 | Discharge: 2016-04-12 | DRG: 885 | Disposition: A | Discharge status: Home / Self Care | Payer: Medicaid Other | Source: Intra-hospital | Attending: Psychiatry | Admitting: Psychiatry

## 2016-04-07 ENCOUNTER — Encounter (HOSPITAL_COMMUNITY): Payer: Self-pay | Admitting: *Deleted

## 2016-04-07 DIAGNOSIS — Z833 Family history of diabetes mellitus: Secondary | ICD-10-CM

## 2016-04-07 DIAGNOSIS — Z794 Long term (current) use of insulin: Secondary | ICD-10-CM

## 2016-04-07 DIAGNOSIS — F1420 Cocaine dependence, uncomplicated: Secondary | ICD-10-CM | POA: Diagnosis present

## 2016-04-07 DIAGNOSIS — Z9114 Patient's other noncompliance with medication regimen: Secondary | ICD-10-CM

## 2016-04-07 DIAGNOSIS — F17210 Nicotine dependence, cigarettes, uncomplicated: Secondary | ICD-10-CM | POA: Diagnosis present

## 2016-04-07 DIAGNOSIS — R45851 Suicidal ideations: Secondary | ICD-10-CM | POA: Diagnosis present

## 2016-04-07 DIAGNOSIS — E119 Type 2 diabetes mellitus without complications: Secondary | ICD-10-CM | POA: Diagnosis present

## 2016-04-07 DIAGNOSIS — Z79891 Long term (current) use of opiate analgesic: Secondary | ICD-10-CM

## 2016-04-07 DIAGNOSIS — R4587 Impulsiveness: Secondary | ICD-10-CM | POA: Diagnosis present

## 2016-04-07 DIAGNOSIS — F200 Paranoid schizophrenia: Principal | ICD-10-CM | POA: Diagnosis present

## 2016-04-07 DIAGNOSIS — F251 Schizoaffective disorder, depressive type: Secondary | ICD-10-CM

## 2016-04-07 DIAGNOSIS — F319 Bipolar disorder, unspecified: Secondary | ICD-10-CM | POA: Diagnosis present

## 2016-04-07 DIAGNOSIS — Z62810 Personal history of physical and sexual abuse in childhood: Secondary | ICD-10-CM | POA: Diagnosis present

## 2016-04-07 DIAGNOSIS — F1020 Alcohol dependence, uncomplicated: Secondary | ICD-10-CM | POA: Clinically undetermined

## 2016-04-07 DIAGNOSIS — Z79899 Other long term (current) drug therapy: Secondary | ICD-10-CM | POA: Diagnosis not present

## 2016-04-07 DIAGNOSIS — G4700 Insomnia, unspecified: Secondary | ICD-10-CM | POA: Diagnosis present

## 2016-04-07 DIAGNOSIS — F17200 Nicotine dependence, unspecified, uncomplicated: Secondary | ICD-10-CM | POA: Diagnosis present

## 2016-04-07 LAB — CBG MONITORING, ED: Glucose-Capillary: 229 mg/dL — ABNORMAL HIGH (ref 65–99)

## 2016-04-07 LAB — RAPID URINE DRUG SCREEN, HOSP PERFORMED
AMPHETAMINES: NOT DETECTED
BENZODIAZEPINES: NOT DETECTED
Barbiturates: NOT DETECTED
COCAINE: NOT DETECTED
OPIATES: NOT DETECTED
TETRAHYDROCANNABINOL: NOT DETECTED

## 2016-04-07 LAB — COMPREHENSIVE METABOLIC PANEL
ALT: 18 U/L (ref 17–63)
ANION GAP: 6 (ref 5–15)
AST: 15 U/L (ref 15–41)
Albumin: 4.3 g/dL (ref 3.5–5.0)
Alkaline Phosphatase: 90 U/L (ref 38–126)
BUN: 7 mg/dL (ref 6–20)
CHLORIDE: 106 mmol/L (ref 101–111)
CO2: 24 mmol/L (ref 22–32)
CREATININE: 0.72 mg/dL (ref 0.61–1.24)
Calcium: 9.3 mg/dL (ref 8.9–10.3)
GFR calc non Af Amer: >60 mL/min (ref >60)
Glucose, Bld: 279 mg/dL — ABNORMAL HIGH (ref 65–99)
POTASSIUM: 3.4 mmol/L — AB (ref 3.5–5.1)
SODIUM: 136 mmol/L (ref 135–145)
Total Bilirubin: 0.4 mg/dL (ref 0.3–1.2)
Total Protein: 7 g/dL (ref 6.5–8.1)

## 2016-04-07 LAB — ETHANOL

## 2016-04-07 MED ORDER — GABAPENTIN 300 MG PO CAPS
300.0000 mg | ORAL_CAPSULE | Freq: Three times a day (TID) | ORAL | Status: DC
  Administered 2016-04-08 – 2016-04-12 (×14): 300 mg via ORAL
  Filled 2016-04-07 (×22): qty 1

## 2016-04-07 MED ORDER — GABAPENTIN 300 MG PO CAPS
300.0000 mg | ORAL_CAPSULE | Freq: Three times a day (TID) | ORAL | Status: DC
  Administered 2016-04-07 (×2): 300 mg via ORAL
  Filled 2016-04-07 (×2): qty 1

## 2016-04-07 MED ORDER — TRAZODONE HCL 100 MG PO TABS
100.0000 mg | ORAL_TABLET | Freq: Every day | ORAL | Status: DC
  Administered 2016-04-07 – 2016-04-11 (×5): 100 mg via ORAL
  Filled 2016-04-07 (×9): qty 1

## 2016-04-07 MED ORDER — ALUM & MAG HYDROXIDE-SIMETH 200-200-20 MG/5ML PO SUSP: 30.0000 mL | ORAL | Status: DC | PRN

## 2016-04-07 MED ORDER — INSULIN GLARGINE 100 UNIT/ML ~~LOC~~ SOLN
40.0000 [IU] | Freq: Every day | SUBCUTANEOUS | Status: DC
  Administered 2016-04-07: 40 [IU] via SUBCUTANEOUS
  Filled 2016-04-07: qty 0.4

## 2016-04-07 MED ORDER — MAGNESIUM HYDROXIDE 400 MG/5ML PO SUSP: 30.0000 mL | Freq: Every day | ORAL | Status: DC | PRN

## 2016-04-07 MED ORDER — ARIPIPRAZOLE 10 MG PO TABS
20.0000 mg | ORAL_TABLET | Freq: Every day | ORAL | Status: DC
  Administered 2016-04-07: 20 mg via ORAL
  Filled 2016-04-07: qty 2

## 2016-04-07 MED ORDER — NICOTINE 21 MG/24HR TD PT24
21.0000 mg | MEDICATED_PATCH | Freq: Every day | TRANSDERMAL | Status: DC
  Administered 2016-04-08 – 2016-04-12 (×5): 21 mg via TRANSDERMAL
  Filled 2016-04-07 (×8): qty 1

## 2016-04-07 MED ORDER — METFORMIN HCL 500 MG PO TABS
500.0000 mg | ORAL_TABLET | Freq: Two times a day (BID) | ORAL | Status: DC
  Administered 2016-04-08 – 2016-04-12 (×9): 500 mg via ORAL
  Filled 2016-04-07 (×15): qty 1

## 2016-04-07 MED ORDER — NICOTINE 21 MG/24HR TD PT24
21.0000 mg | MEDICATED_PATCH | Freq: Every day | TRANSDERMAL | Status: DC
  Administered 2016-04-07: 21 mg via TRANSDERMAL
  Filled 2016-04-07: qty 1

## 2016-04-07 MED ORDER — TRAZODONE HCL 100 MG PO TABS
100.0000 mg | ORAL_TABLET | Freq: Every day | ORAL | Status: DC
  Administered 2016-04-07: 100 mg via ORAL
  Filled 2016-04-07: qty 1

## 2016-04-07 MED ORDER — ACETAMINOPHEN 325 MG PO TABS
650.0000 mg | ORAL_TABLET | Freq: Four times a day (QID) | ORAL | Status: DC | PRN
  Administered 2016-04-10 – 2016-04-12 (×2): 650 mg via ORAL
  Filled 2016-04-07 (×2): qty 2

## 2016-04-07 MED ORDER — METFORMIN HCL 500 MG PO TABS
500.0000 mg | ORAL_TABLET | Freq: Two times a day (BID) | ORAL | Status: DC
  Administered 2016-04-07 (×2): 500 mg via ORAL
  Filled 2016-04-07 (×2): qty 1

## 2016-04-07 MED ORDER — INSULIN GLARGINE 100 UNIT/ML ~~LOC~~ SOLN
40.0000 [IU] | Freq: Every day | SUBCUTANEOUS | Status: DC
  Administered 2016-04-08 – 2016-04-12 (×5): 40 [IU] via SUBCUTANEOUS

## 2016-04-07 MED ORDER — ARIPIPRAZOLE 10 MG PO TABS
20.0000 mg | ORAL_TABLET | Freq: Every day | ORAL | Status: DC
  Administered 2016-04-08 – 2016-04-12 (×5): 20 mg via ORAL
  Filled 2016-04-07 (×8): qty 2

## 2016-04-07 NOTE — ED Notes (Signed)
Bed: [facility]41 Expected date:  Expected time:  Means of arrival:  Comments: Hold for triage 3 

## 2016-04-07 NOTE — Progress Notes (Deleted)
04/07/16 1407:  LRT went by pt room, pt was with visitor.    Ricarda Atayde, LRT/CTRS 

## 2016-04-07 NOTE — ED Notes (Signed)
Introduced self to patient. Pt oriented to unit expectations.  Assessed pt for:  A) Anxiety &/or agitation: Pt is anxious about hearing voices to hurt himself and he wants to get help so that he will be safe. He is alert and appropriate, but sleeping a lot.   S) Safety: Safety maintained with q-15-minute checks and hourly rounds by staff.  A) ADLs: Pt able to perform ADLs independently.  P) Pick-Up (room cleanliness): Pt's room clean and free of clutter.

## 2016-04-07 NOTE — ED Notes (Signed)
Attempted to call [phone] with [facility]

## 2016-04-07 NOTE — ED Provider Notes (Signed)
[facility] Provider Note   CSN: [id number] Arrival date & time: 04/06/16  2254     History   Chief Complaint Chief Complaint  Patient presents with  . Paranoid    HPI Steven Christensen is a 46 y.o. male.  HPI Patient presents with auditory hallucinations and suicidal thoughts. Has a history of hallucinations and schizophrenia. She has been off all his medicines for a while now. States he's just been doing so bad he hasn't been able take his medicines. He is diabetic but states he doesn't feel as if his sugar is high. States he has lost some weight and that has helped the sugars. States the voices are telling him that they're going to kill him and that is going to die. States he is now depressed and more suicidal because of it. States that his urine may show some cocaine in it. Past Medical History:  Diagnosis Date  . Anxiety   . Bipolar affective (HCC)   . Depression   . Diabetes mellitus   . Schizophrenia (HCC)   . Sleeping difficulties     Patient Active Problem List   Diagnosis Date Noted  . Cocaine abuse with cocaine-induced mood disorder (HCC) 01/13/2016  . Diabetes (HCC) 06/02/2015  . Tobacco use disorder 06/02/2015  . Hemorrhoids 08/02/2013    History reviewed. No pertinent surgical history.     Home Medications    Prior to Admission medications   Medication Sig Start Date End Date Taking? Authorizing Provider  ARIPiprazole (ABILIFY) 20 MG tablet Take 1 tablet (20 mg total) by mouth daily. Patient not taking: Reported on 04/06/2016 06/06/15   Jimmy Footman, MD  ARIPiprazole 400 MG SUSR Inject 400 mg into the muscle every 28 (twenty-eight) days. Patient not taking: Reported on 04/06/2016 07/04/15   Jimmy Footman, MD  diltiazem 2 % GEL Apply 1 application topically 2 (two) times daily. Patient not taking: Reported on 04/06/2016 01/23/16   Linna Hoff, MD  gabapentin (NEURONTIN) 300 MG capsule Take 1 capsule (300 mg total) by mouth 3  (three) times daily. Patient not taking: Reported on 04/06/2016 06/06/15   Jimmy Footman, MD  insulin glargine (LANTUS) 100 UNIT/ML injection Inject 40 Units into the skin every morning.     Historical Provider, MD  metFORMIN (GLUCOPHAGE) 500 MG tablet Take 1 tablet (500 mg total) by mouth 2 (two) times daily with a meal. Patient not taking: Reported on 04/06/2016 06/06/15   Jimmy Footman, MD  traZODone (DESYREL) 100 MG tablet Take 1 tablet (100 mg total) by mouth at bedtime. Patient not taking: Reported on 04/06/2016 06/06/15   Jimmy Footman, MD  Vitamin D, Ergocalciferol, (DRISDOL) 50000 units CAPS capsule Take 50,000 Units by mouth every 7 (seven) days.    Historical Provider, MD    Family History No family history on file.  Social History Social History  Substance Use Topics  . Smoking status: Current Every Day Smoker    Packs/day: 1.00    Years: 35.00    Types: Cigarettes  . Smokeless tobacco: Not on file  . Alcohol use Yes     Allergies   Patient has no known allergies.   Review of Systems Review of Systems   Physical Exam Updated Vital Signs BP 147/95 (BP Location: Left Arm)   Pulse 78   Temp 98.4 F (36.9 C) (Oral)   Resp 18   SpO2 96%   Physical Exam   ED Treatments / Results  Labs (all labs ordered are listed, but only  abnormal results are displayed) Labs Reviewed  COMPREHENSIVE METABOLIC PANEL - Abnormal; Notable for the following:       Result Value   Potassium 3.4 (*)    Glucose, Bld 279 (*)    All other components within normal limits  CBC WITH DIFFERENTIAL/PLATELET - Abnormal; Notable for the following:    WBC 10.7 (*)    All other components within normal limits  ETHANOL  RAPID URINE DRUG SCREEN, HOSP PERFORMED    EKG  EKG Interpretation None       Radiology No results found.  Procedures Procedures (including critical care time)  Medications Ordered in ED Medications - No data to display   Initial  Impression / Assessment and Plan / ED Course  I have reviewed the triage vital signs and the nursing notes.  Pertinent labs & imaging results that were available during my care of the patient were reviewed by me and considered in my medical decision making (see chart for details).  Clinical Course     Patient with hallucinations and suicidal thoughts. History of same. Medically cleared at this time. To be seen by TTS.  Final Clinical Impressions(s) / ED Diagnoses   Final diagnoses:  Auditory hallucinations  Suicidal ideation    New Prescriptions New Prescriptions   No medications on file     Benjiman CoreNathan Jovi Zavadil, MD 04/07/16 [phone]

## 2016-04-07 NOTE — ED Notes (Signed)
Patient search completed and no contraband found.

## 2016-04-07 NOTE — ED Notes (Signed)
[facility] but the telephone was not answered.

## 2016-04-07 NOTE — BH Assessment (Signed)
[facility] Assessment Progress Note  Per Thedore MinsMojeed Akintayo, MD, this pt requires psychiatric hospitalization at this time.  Berneice Heinrichina Tate, RN, [facility]  Pt has signed Voluntary Admission and Consent for Treatment, as well as Consent to Release Information [facility] and to his mother, and a notification call has been placed to the former.  Signed [facility].  Pt's nurse, Diane, has been notified, and agrees to send original paperwork along with pt via [facility], and to call report to [phone].  Doylene Canninghomas Noura Purpura, MA Triage Specialist [phone]

## 2016-04-07 NOTE — Tx Team (Signed)
Initial Treatment Plan 04/07/2016 9:11 PM Steven DenseGerald Recendiz [id number]    PATIENT STRESSORS: Substance abuse Other: Psychosis   PATIENT STRENGTHS: Communication skills Motivation for treatment/growth Physical Health Supportive family/friends   PATIENT IDENTIFIED PROBLEMS: At risk for suicide  Substance abuse  "Trying to get off drugs"  "Be more responsible"               DISCHARGE CRITERIA:  Ability to meet basic life and health needs Improved stabilization in mood, thinking, and/or behavior Motivation to continue treatment in a less acute level of care Need for constant or close observation no longer present Verbal commitment to aftercare and medication compliance Withdrawal symptoms are absent or subacute and managed without 24-hour nursing intervention  PRELIMINARY DISCHARGE PLAN: Attend 12-step recovery group Outpatient therapy Return to previous living arrangement  PATIENT/FAMILY INVOLVEMENT: This treatment plan has been presented to and reviewed with the patient, Steven Christensen.  The patient and family have been given the opportunity to ask questions and make suggestions.  Carleene OverlieMiddleton, Oyinkansola Truax P, RN 04/07/2016, 9:11 PM

## 2016-04-07 NOTE — Progress Notes (Signed)
04/07/16 1407:  LRT introduced self to pt and offered activities.  Pt was lying down and declined.  Caroll RancherMarjette Yanky Vanderburg, LRT/CTRS

## 2016-04-07 NOTE — ED Notes (Signed)
Pt transported to [facility] by [facility]. All belongings returned to pt who signed for same.  
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
## 2016-04-07 NOTE — ED Notes (Signed)
Patient is alert and oriented. Patient states he is hearing voices telling him to harm himself. Patient provided support and encouragement. Q 15 minute checks in progress and maintained. Patient remains safe on unit.
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
## 2016-04-08 ENCOUNTER — Encounter (HOSPITAL_COMMUNITY): Payer: Self-pay | Admitting: Psychiatry

## 2016-04-08 DIAGNOSIS — F1420 Cocaine dependence, uncomplicated: Secondary | ICD-10-CM

## 2016-04-08 DIAGNOSIS — Z833 Family history of diabetes mellitus: Secondary | ICD-10-CM

## 2016-04-08 DIAGNOSIS — Z794 Long term (current) use of insulin: Secondary | ICD-10-CM

## 2016-04-08 DIAGNOSIS — F200 Paranoid schizophrenia: Principal | ICD-10-CM | POA: Diagnosis present

## 2016-04-08 DIAGNOSIS — F1020 Alcohol dependence, uncomplicated: Secondary | ICD-10-CM | POA: Clinically undetermined

## 2016-04-08 DIAGNOSIS — Z79899 Other long term (current) drug therapy: Secondary | ICD-10-CM

## 2016-04-08 LAB — GLUCOSE, CAPILLARY
GLUCOSE-CAPILLARY: 179 mg/dL — AB (ref 65–99)
GLUCOSE-CAPILLARY: 227 mg/dL — AB (ref 65–99)
Glucose-Capillary: 167 mg/dL — ABNORMAL HIGH (ref 65–99)
Glucose-Capillary: 177 mg/dL — ABNORMAL HIGH (ref 65–99)
Glucose-Capillary: 182 mg/dL — ABNORMAL HIGH (ref 65–99)

## 2016-04-08 MED ORDER — ARIPIPRAZOLE ER 400 MG IM SRER
400.0000 mg | INTRAMUSCULAR | Status: DC
  Administered 2016-04-09: 400 mg via INTRAMUSCULAR

## 2016-04-08 MED ORDER — OLANZAPINE 10 MG PO TABS
10.0000 mg | ORAL_TABLET | Freq: Three times a day (TID) | ORAL | Status: DC | PRN
  Administered 2016-04-12: 10 mg via ORAL
  Filled 2016-04-08: qty 1

## 2016-04-08 MED ORDER — INSULIN ASPART 100 UNIT/ML ~~LOC~~ SOLN
0.0000 [IU] | Freq: Every day | SUBCUTANEOUS | Status: DC
  Administered 2016-04-08: 2 [IU] via SUBCUTANEOUS

## 2016-04-08 MED ORDER — ARIPIPRAZOLE ER 400 MG IM SRER: 400.0000 mg | INTRAMUSCULAR | Status: DC

## 2016-04-08 MED ORDER — OLANZAPINE 10 MG IM SOLR: 10.0000 mg | Freq: Three times a day (TID) | INTRAMUSCULAR | Status: DC | PRN

## 2016-04-08 MED ORDER — INSULIN ASPART 100 UNIT/ML ~~LOC~~ SOLN
0.0000 [IU] | Freq: Three times a day (TID) | SUBCUTANEOUS | Status: DC
  Administered 2016-04-08 – 2016-04-09 (×4): 3 [IU] via SUBCUTANEOUS
  Administered 2016-04-09 – 2016-04-10 (×4): 5 [IU] via SUBCUTANEOUS
  Administered 2016-04-11: 8 [IU] via SUBCUTANEOUS
  Administered 2016-04-11: 3 [IU] via SUBCUTANEOUS
  Administered 2016-04-11: 5 [IU] via SUBCUTANEOUS
  Administered 2016-04-12 (×2): 3 [IU] via SUBCUTANEOUS

## 2016-04-08 MED ORDER — HYDROXYZINE HCL 25 MG PO TABS
25.0000 mg | ORAL_TABLET | ORAL | Status: DC | PRN
  Administered 2016-04-09 – 2016-04-11 (×3): 25 mg via ORAL
  Filled 2016-04-08 (×3): qty 1

## 2016-04-08 MED ORDER — LORAZEPAM 1 MG PO TABS: 1.0000 mg | ORAL_TABLET | Freq: Four times a day (QID) | ORAL | Status: DC | PRN

## 2016-04-08 NOTE — Progress Notes (Signed)
DAR NOTE: Patient presents with anxious affect and mood.  Denies pain, auditory and visual hallucinations.  Rates depression at 0, hopelessness at 0, and anxiety at 0.  Maintained on routine safety checks.  Medications given as prescribed.  Support and encouragement offered as needed.  Attended group and participated.  Patient observed socializing with peers in the dayroom.  Offered no complaint.    

## 2016-04-08 NOTE — Progress Notes (Signed)
Recreation Therapy Notes  INPATIENT RECREATION THERAPY ASSESSMENT  Patient Details Name: Steven Christensen MRN: [id number] DOB: 02/11/1971 Today's Date: 04/08/2016  Patient Stressors:  (Pt did not name any stressors.)  Pt stated he was here for hearing voices and suicide attempt.  Coping Skills:   Isolate, Substance Abuse, Avoidance, Self-Injury, Exercise, Talking, Music  Personal Challenges: Communication, Concentration, Decision-Making, Expressing Yourself, Relationships, Stress Management, Substance Abuse, Trusting Others  Leisure Interests (2+):  Crafts - Woodworking, Harold BarbanNature - Lawn care  Awareness of Community Resources:  Yes  Community Resources:  Research scientist (physical sciences)Movie Theaters, [place], [place], [place]  Current Use: No  If no, Barriers?: Social  Patient Strengths:  Good, kind person; Try to help people  Patient Identified Areas of Improvement:  Social skills; Get along with others  Current Recreation Participation:  "I don't"  Patient Goal for Hospitalization:  "Get myself back right, get off drugs, alcohol, cigarettes and go to another facility"  [place]  [place]  Current SI (including self-harm):  No  Current HI:  No  Consent to Intern Participation: N/A   Caroll RancherMarjette Braylee Lal, LRT/CTRS  Caroll RancherLindsay, Nikaela Coyne A 04/08/2016, 12:23 PM

## 2016-04-08 NOTE — Progress Notes (Signed)
Patient ID: Steven Christensen, male   DOB: 01/22/1971, 46 y.o.   MRN: [id number] D: Client visible on the unit, interacts appropriately with peers. Client reports of his day "going good" A: Clinical research associateWriter provided emotional support, encouraged group. Staff will monitor q2015min for safety. R: Client is safe on the unit, attended karaoke, sang several songs.

## 2016-04-08 NOTE — Progress Notes (Signed)
Patient attended karaoke group this evening.  
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
## 2016-04-08 NOTE — [facility] Suicide Risk Assessment (Signed)
[facility] INPATIENT:  Family/Significant Other Suicide Prevention Education  Suicide Prevention Education:  Education Completed; Steven Christensen, mother, [phone]  has been identified by the patient as the family member/significant other with whom the patient will be residing, and identified as the person(s) who will aid the patient in the event of a mental health crisis (suicidal ideations/suicide attempt).  With written consent from the patient, the family member/significant other has been provided the following suicide prevention education, prior to the and/or following the discharge of the patient.  The suicide prevention education provided includes the following:  Suicide risk factors  Suicide prevention and interventions  National Suicide Hotline telephone number  [facility] assessment telephone number  [facility] Emergency Assistance 911  [facility] and/or Residential Mobile Crisis Unit telephone number  Request made of family/significant other to:  Remove weapons (e.g., guns, rifles, knives), all items previously/currently identified as safety concern.    Remove drugs/medications (over-the-counter, prescriptions, illicit drugs), all items previously/currently identified as a safety concern.  The family member/significant other verbalizes understanding of the suicide prevention education information provided.  The family member/significant other agrees to remove the items of safety concern listed above. She is hoping he will go to a long term program like [facility] from here. Steven Christensen 04/08/2016, 10:57 AM

## 2016-04-08 NOTE — Tx Team (Signed)
Interdisciplinary Treatment and Diagnostic Plan Update  04/08/2016 Time of Session: 4:18 PM  Steven Christensen MRN: [id number]  Principal Diagnosis: Paranoid schizophrenia [facility])  Secondary Diagnoses: Principal Problem:   Paranoid schizophrenia ([place]) Active Problems:   Diabetes ([place])   Tobacco use disorder   Alcohol use disorder, moderate, dependence ([place])   Cocaine use disorder, moderate, dependence ([place])   Current Medications:  Current Facility-Administered Medications  Medication Dose Route Frequency Provider Last Rate Last Dose  . acetaminophen (TYLENOL) tablet 650 mg  650 mg Oral Q6H PRN Patrecia Pour, NP      . alum & mag hydroxide-simeth (MAALOX/MYLANTA) 200-200-20 MG/5ML suspension 30 mL  30 mL Oral Q4H PRN Patrecia Pour, NP      . ARIPiprazole (ABILIFY) tablet 20 mg  20 mg Oral Daily Patrecia Pour, NP   20 mg at 04/08/16 4970  . [START ON 04/09/2016] ARIPiprazole ER SRER 400 mg  400 mg Intramuscular Q30 days Ursula Alert, MD      . gabapentin (NEURONTIN) capsule 300 mg  300 mg Oral TID Patrecia Pour, NP   300 mg at 04/08/16 1151  . hydrOXYzine (ATARAX/VISTARIL) tablet 25 mg  25 mg Oral Q4H PRN Saramma Eappen, MD      . insulin aspart (novoLOG) injection 0-15 Units  0-15 Units Subcutaneous TID WC Ursula Alert, MD   3 Units at 04/08/16 1148  . insulin aspart (novoLOG) injection 0-5 Units  0-5 Units Subcutaneous QHS Saramma Eappen, MD      . insulin glargine (LANTUS) injection 40 Units  40 Units Subcutaneous Daily Patrecia Pour, NP   40 Units at 04/08/16 0809  . LORazepam (ATIVAN) tablet 1 mg  1 mg Oral Q6H PRN Saramma Eappen, MD      . magnesium hydroxide (MILK OF MAGNESIA) suspension 30 mL  30 mL Oral Daily PRN Patrecia Pour, NP      . metFORMIN (GLUCOPHAGE) tablet 500 mg  500 mg Oral BID WC Patrecia Pour, NP   500 mg at 04/08/16 2637  . nicotine (NICODERM CQ - dosed in mg/24 hours) patch 21 mg  21 mg Transdermal Daily Patrecia Pour, NP   21 mg at 04/08/16 8588  .  OLANZapine (ZYPREXA) tablet 10 mg  10 mg Oral TID PRN Ursula Alert, MD       Or  . OLANZapine (ZYPREXA) injection 10 mg  10 mg Intramuscular TID PRN Ursula Alert, MD      . traZODone (DESYREL) tablet 100 mg  100 mg Oral QHS Patrecia Pour, NP   100 mg at 04/07/16 2122    PTA Medications: Prescriptions Prior to Admission  Medication Sig Dispense Refill Last Dose  . glipiZIDE-metformin (METAGLIP) 5-500 MG tablet      . oxyCODONE-acetaminophen (PERCOCET/ROXICET) 5-325 MG tablet      . ARIPiprazole (ABILIFY) 20 MG tablet Take 1 tablet (20 mg total) by mouth daily. (Patient not taking: Reported on 04/06/2016) 30 tablet 0 Not Taking at Unknown time  . ARIPiprazole 400 MG SUSR Inject 400 mg into the muscle every 28 (twenty-eight) days. (Patient not taking: Reported on 04/06/2016) 1 each 0 Not Taking at Unknown time  . diltiazem 2 % GEL Apply 1 application topically 2 (two) times daily. (Patient not taking: Reported on 04/06/2016) 30 g 1 Not Taking at Unknown time  . gabapentin (NEURONTIN) 300 MG capsule Take 1 capsule (300 mg total) by mouth 3 (three) times daily. (Patient not taking: Reported on 04/06/2016) 90 capsule  0 Not Taking at Unknown time  . insulin glargine (LANTUS) 100 UNIT/ML injection Inject 40 Units into the skin every morning.    Not Taking at Unknown time  . metFORMIN (GLUCOPHAGE) 500 MG tablet Take 1 tablet (500 mg total) by mouth 2 (two) times daily with a meal. (Patient not taking: Reported on 04/06/2016) 60 tablet 0 Not Taking at Unknown time  . traZODone (DESYREL) 100 MG tablet Take 1 tablet (100 mg total) by mouth at bedtime. (Patient not taking: Reported on 04/06/2016) 30 tablet 0 Not Taking at Unknown time  . Vitamin D, Ergocalciferol, (DRISDOL) 50000 units CAPS capsule Take 50,000 Units by mouth every 7 (seven) days.   Not Taking at Unknown time    Treatment Modalities: Medication Management, Group therapy, Case management,  1 to 1 session with clinician, Psychoeducation, Recreational  therapy.   Physician Treatment Plan for Primary Diagnosis: Paranoid schizophrenia ([place]) Long Term Goal(s): Improvement in symptoms so as ready for discharge  Short Term Goals:  Ability to identify triggers associated with substance abuse/mental health issues will improve  Medication Management: Evaluate patient's response, side effects, and tolerance of medication regimen.  Therapeutic Interventions: 1 to 1 sessions, Unit Group sessions and Medication administration.  Evaluation of Outcomes: Progressing  Physician Treatment Plan for Secondary Diagnosis: Principal Problem:   Paranoid schizophrenia ([place]) Active Problems:   Diabetes ([place])   Tobacco use disorder   Alcohol use disorder, moderate, dependence ([facility])   Cocaine use disorder, moderate, dependence ([place])   Long Term Goal(s): Improvement in symptoms so as ready for discharge  Short Term Goals: Compliance with prescribed medications will improve   Medication Management: Evaluate patient's response, side effects, and tolerance of medication regimen.  Therapeutic Interventions: 1 to 1 sessions, Unit Group sessions and Medication administration.  Evaluation of Outcomes: Progressing   RN Treatment Plan for Primary Diagnosis: Paranoid schizophrenia ([place]) Long Term Goal(s): Knowledge of disease and therapeutic regimen to maintain health will improve  Short Term Goals: Ability to identify and develop effective coping behaviors will improve and Compliance with prescribed medications will improve  Medication Management: RN will administer medications as ordered by provider, will assess and evaluate patient's response and provide education to patient for prescribed medication. RN will report any adverse and/or side effects to prescribing provider.  Therapeutic Interventions: 1 on 1 counseling sessions, Psychoeducation, Medication administration, Evaluate responses to treatment, Monitor vital signs and CBGs as ordered,  Perform/monitor CIWA, COWS, AIMS and Fall Risk screenings as ordered, Perform wound care treatments as ordered.  Evaluation of Outcomes: Progressing   Recreational Therapy Treatment Plan for Primary Diagnosis: Paranoid schizophrenia ([place]) Long Term Goal(s):  LTG- Patient will participate in recreation therapy tx in at least 2 group sessions without prompting from LRT.  Short Term Goals: Patient will be able to identify at least 5 coping skills for admitting dx by conclusion of recreation therapy tx.  Treatment Modalities: Group and Pet Therapy  Therapeutic Interventions: Psychoeducation  Evaluation of Outcomes: Progressing   LCSW Treatment Plan for Primary Diagnosis: Paranoid schizophrenia ([place]) Long Term Goal(s): Safe transition to appropriate next level of care at discharge, Engage patient in therapeutic group addressing interpersonal concerns.  Short Term Goals: Engage patient in aftercare planning with referrals and resources  Therapeutic Interventions: Assess for all discharge needs, 1 to 1 time with Social worker, Explore available resources and support systems, Assess for adequacy in community support network, Educate family and significant other(s) on suicide prevention, Complete Psychosocial Assessment, Interpersonal group therapy.  Evaluation  of Outcomes: Met   Progress in Treatment: Attending groups: Yes Participating in groups: Yes Taking medication as prescribed: Yes Toleration medication: Yes, no side effects reported at this time Family/Significant other contact made: Yes Patient understands diagnosis: Yes AEB sking for help to get back on meds Discussing patient identified problems/goals with staff: Yes Medical problems stabilized or resolved: Yes Denies suicidal/homicidal ideation: Yes Issues/concerns per patient self-inventory: None Other: N/A  New problem(s) identified: None identified at this time.   New Short Term/Long Term Goal(s): None identified at  this time.   Discharge Plan or Barriers: States he hopes to get into [facility] or [facility], but also states he needs to go home first-does not want to go directly from here  Reason for Continuation of Hospitalization: Paranoia  Depression Hallucinations Medication stabilization   Estimated Length of Stay: 3-5 days  Attendees: Patient: 04/08/2016  4:18 PM  Physician: Ursula Alert, MD 04/08/2016  4:18 PM  Nursing: Hoy Register, RN 04/08/2016  4:18 PM  RN Care Manager: Lars Pinks, RN 04/08/2016  4:18 PM  Social Worker: Ripley Fraise 04/08/2016  4:18 PM  Recreational Therapist: Laretta Bolster  04/08/2016  4:18 PM  Other: Norberto Sorenson 04/08/2016  4:18 PM  Other:  04/08/2016  4:18 PM    Scribe for Treatment Team:  Roque Lias 04/08/2016 4:18 PM
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
## 2016-04-08 NOTE — Plan of Care (Signed)
Problem: Safety: Goal: Periods of time without injury will increase Outcome: Progressing Periods of time without injury will increase with q3215min safety checks. Client remains safe on the unit.

## 2016-04-08 NOTE — Progress Notes (Signed)
Recreation Therapy Notes  Date: 04/08/16 Time: 1000 Location: 500 Hall Dayroom  Group Topic: Leisure Education  Goal Area(s) Addresses:  Patient will identify positive leisure activities.  Patient will identify one positive benefit of participation in leisure activities.   Behavioral Response: Observed  Intervention: Construction paper, markers, scissors, glue sticks and magazines  Activity: Leisure PSA.  Patients were to create a public service announcement to explain the benefits of recreation and leisure.  Patients were to also provide examples of various types of recreation and leisure.  Education:  Leisure Education, Building control surveyorDischarge Planning  Education Outcome: Acknowledges education/In group clarification offered/Needs additional education  Clinical Observations/Feedback: Pt observed peers.  Pt left early with social worker and did not return.   Caroll RancherMarjette Jaz Laningham, LRT/CTRS         Caroll RancherLindsay, Harrison Paulson A 04/08/2016 12:05 PM
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
## 2016-04-08 NOTE — [facility] Group Notes (Addendum)
[facility] Group Notes:  (Counselor/Nursing/MHT/Case Management/Adjunct)  04/08/2016 1:15PM  Type of Therapy:  Group Therapy  Participation Level:  Active  Participation Quality:  Appropriate  Affect:  Flat  Cognitive:  Oriented  Insight:  Improving  Engagement in Group:  Limited  Engagement in Therapy:  Limited  Modes of Intervention:  Discussion, Exploration and Socialization  Summary of Progress/Problems: The topic for group was balance in life.  Pt participated in the discussion about when their life was in balance and out of balance and how this feels.  Pt discussed ways to get back in balance and short term goals they can work on to get where they want to be. Stayed the entire time, engaged throughout.  "My faith in a higher power is what gets me through the hard times.  But my mother also taught me that praying is not enough.  You also have to believe that things will get better-have hope-and then you have to try .  If you don't put an effort in yourself, it will lead to negative results.  Cited his mother several times as someone who has always believed in him and encouraged him.   Daryel Geraldorth, Calea Hribar B 04/08/2016 2:25 PM

## 2016-04-09 LAB — T4, FREE: Free T4: 0.79 ng/dL (ref 0.61–1.12)

## 2016-04-09 LAB — TSH: TSH: 0.239 u[IU]/mL — ABNORMAL LOW (ref 0.350–4.500)

## 2016-04-09 LAB — GLUCOSE, CAPILLARY
GLUCOSE-CAPILLARY: 167 mg/dL — AB (ref 65–99)
Glucose-Capillary: 175 mg/dL — ABNORMAL HIGH (ref 65–99)
Glucose-Capillary: 171 mg/dL — ABNORMAL HIGH (ref 65–99)
Glucose-Capillary: 246 mg/dL — ABNORMAL HIGH (ref 65–99)

## 2016-04-09 LAB — COMPREHENSIVE METABOLIC PANEL
ALT: 16 U/L — AB (ref 17–63)
AST: 16 U/L (ref 15–41)
Albumin: 4.3 g/dL (ref 3.5–5.0)
Alkaline Phosphatase: 84 U/L (ref 38–126)
Anion gap: 8 (ref 5–15)
BUN: 9 mg/dL (ref 6–20)
CALCIUM: 9.5 mg/dL (ref 8.9–10.3)
CO2: 31 mmol/L (ref 22–32)
CREATININE: 0.86 mg/dL (ref 0.61–1.24)
Chloride: 102 mmol/L (ref 101–111)
Glucose, Bld: 134 mg/dL — ABNORMAL HIGH (ref 65–99)
Potassium: 3.7 mmol/L (ref 3.5–5.1)
Sodium: 141 mmol/L (ref 135–145)
Total Bilirubin: 0.4 mg/dL (ref 0.3–1.2)
Total Protein: 7.6 g/dL (ref 6.5–8.1)

## 2016-04-09 LAB — LIPID PANEL
CHOL/HDL RATIO: 3.7 ratio
CHOLESTEROL: 144 mg/dL (ref 0–200)
HDL: 39 mg/dL — ABNORMAL LOW (ref >40)
LDL CALC: 81 mg/dL (ref 0–99)
TRIGLYCERIDES: 118 mg/dL (ref <150)
VLDL: 24 mg/dL (ref 0–40)

## 2016-04-09 NOTE — [facility] Group Notes (Signed)
[facility] LCSW Group Therapy  04/09/2016  1:05 PM  Type of Therapy:  Group therapy  Participation Level:  Active  Participation Quality:  Attentive  Affect:  Flat  Cognitive:  Oriented  Insight:  Limited  Engagement in Therapy:  Limited  Modes of Intervention:  Discussion, Socialization  Summary of Progress/Problems:  Chaplain was here to lead a group on themes of hope and courage. "Hope is fulfillment.  Faith is in there too.  I'm having a hard time finding the word."   "Hope is like  A smile.  It is contagious." Steven Christensen, Kano Heckmann B 04/09/2016 12:36 PM

## 2016-04-09 NOTE — Progress Notes (Signed)
Recreation Therapy Notes  Date: 04/09/16 Time: 1000 Location: 500 Hall Dayroom  Group Topic: Stress Management  Goal Area(s) Addresses:  Patient will verbalize importance of using healthy stress management.  Patient will identify positive emotions associated with healthy stress management.   Behavioral Response: Engaged  Intervention: Stress Management  Activity :  Progressive Muscle Relaxation, [facility] Visual[facility].  LRT introduced the stress management techniques of progressive muscle relaxation and guided imagery to patients.  LRT read scripts to allow patients to engage and participate in the techniques.  Patients were to follow along as LRT read the scripts to participate.    Education:  Stress Management, Discharge Planning.   Education Outcome: Acknowledges edcuation/In group clarification offered/Needs additional education  Clinical Observations/Feedback:  Pt stated one sign of stress is "smiling too much".  Pt was engaged during group.  Pt stated he preferred doing the guided imagery over progressive muscle relaxation.  Caroll RancherMarjette Kortnie Stovall, LRT/CTRS       Caroll RancherLindsay, Shailah Gibbins A 04/09/2016 12:02 PM

## 2016-04-09 NOTE — Progress Notes (Signed)
Patient denies SI, HI and AVH. Patient reported that he AVH stopped when he came into the hospital.  Patient stated he had been med noncompliant while in the community and once back on his medications he feels much better.  Patient was education on long acting antipsychotic medications and was agreeable to taking them.  Patient was pleasant and had no incidents of behavioral dyscontrol.    Assess patient for safety, offer medications as prescribed, engage patient in 1:1 staff talks,   Continue to monitor as prescribed.

## 2016-04-09 NOTE — Progress Notes (Signed)
[facility] MD Progress Note  04/09/2016 1:17 PM Steven Christensen  MRN:  [id number] Subjective: Patient states " I feel ok."  Objective:Steven Waltersis a 46 y.o.AA male, who has a hx of schizophrenia , alcohol, cocaine abuse, noncompliance with medication, who presented to ED ue to increased hallucinations and suicidal thoughts per EHR.  Patient seen and chart reviewed.Discussed patient with treatment team.  Pt today seen in bed, states his anxiety and depression are improving. Pt denies new concerns , tolerating medications well. CIWA/VS reviewed - pt denies withdrawal sx. Pt to have Abilify Maintena IM today . Pt continues to be motivated to go to a substance abuse treatment program.    Principal Problem: Paranoid schizophrenia (HCC) Diagnosis:   Patient Active Problem List   Diagnosis Date Noted  . Paranoid schizophrenia (HCC) [F20.0] 04/08/2016  . Alcohol use disorder, moderate, dependence (HCC) [F10.20] 04/08/2016  . Cocaine use disorder, moderate, dependence (HCC) [F14.20] 04/08/2016  . Diabetes (HCC) [E11.9] 06/02/2015  . Tobacco use disorder [F17.200] 06/02/2015  . Hemorrhoids [K64.9] 08/02/2013   Total Time spent with patient: 20 minutes  Past Psychiatric History: Please see H&P.   Past Medical History:  Past Medical History:  Diagnosis Date  . Anxiety   . Bipolar affective (HCC)   . Depression   . Diabetes mellitus   . Schizophrenia (HCC)   . Sleeping difficulties    History reviewed. No pertinent surgical history. Family History:  Family History  Problem Relation Age of Onset  . Diabetes Maternal Aunt   . Mental illness Neg Hx    Family Psychiatric  History: Please see H&P.  Social History:  History  Alcohol Use  . Yes     History  Drug Use  . Types: "Crack" cocaine, Marijuana    Social History   Social History  . Marital status: Single    Spouse name: N/A  . Number of children: N/A  . Years of education: N/A   Social History Main Topics  .  Smoking status: Current Every Day Smoker    Packs/day: 1.00    Years: 35.00    Types: Cigarettes  . Smokeless tobacco: Never Used  . Alcohol use Yes  . Drug use:     Types: "Crack" cocaine, Marijuana  . Sexual activity: Yes    Birth control/ protection: None   Other Topics Concern  . None   Social History Narrative  . None   Additional Social History:                         Sleep: Fair  Appetite:  Fair  Current Medications: Current Facility-Administered Medications  Medication Dose Route Frequency Provider Last Rate Last Dose  . acetaminophen (TYLENOL) tablet 650 mg  650 mg Oral Q6H PRN Charm Rings, NP      . alum & mag hydroxide-simeth (MAALOX/MYLANTA) 200-200-20 MG/5ML suspension 30 mL  30 mL Oral Q4H PRN Charm Rings, NP      . ARIPiprazole (ABILIFY) tablet 20 mg  20 mg Oral Daily Charm Rings, NP   20 mg at 04/09/16 0826  . ARIPiprazole ER SRER 400 mg  400 mg Intramuscular Q30 days Jomarie Longs, MD   400 mg at 04/09/16 1058  . gabapentin (NEURONTIN) capsule 300 mg  300 mg Oral TID Charm Rings, NP   300 mg at 04/09/16 1210  . hydrOXYzine (ATARAX/VISTARIL) tablet 25 mg  25 mg Oral Q4H PRN Jomarie Longs, MD      .  insulin aspart (novoLOG) injection 0-15 Units  0-15 Units Subcutaneous TID WC Jomarie Longs, MD   3 Units at 04/09/16 1209  . insulin aspart (novoLOG) injection 0-5 Units  0-5 Units Subcutaneous QHS Jomarie Longs, MD   2 Units at 04/08/16 2154  . insulin glargine (LANTUS) injection 40 Units  40 Units Subcutaneous Daily Charm Rings, NP   40 Units at 04/09/16 0827  . LORazepam (ATIVAN) tablet 1 mg  1 mg Oral Q6H PRN Jomarie Longs, MD      . magnesium hydroxide (MILK OF MAGNESIA) suspension 30 mL  30 mL Oral Daily PRN Charm Rings, NP      . metFORMIN (GLUCOPHAGE) tablet 500 mg  500 mg Oral BID WC Charm Rings, NP   500 mg at 04/09/16 0826  . nicotine (NICODERM CQ - dosed in mg/24 hours) patch 21 mg  21 mg Transdermal Daily Charm Rings, NP   21 mg at 04/09/16 0826  . OLANZapine (ZYPREXA) tablet 10 mg  10 mg Oral TID PRN Jomarie Longs, MD       Or  . OLANZapine (ZYPREXA) injection 10 mg  10 mg Intramuscular TID PRN Jomarie Longs, MD      . traZODone (DESYREL) tablet 100 mg  100 mg Oral QHS Charm Rings, NP   100 mg at 04/08/16 2154    Lab Results:  Results for orders placed or performed during the hospital encounter of 04/07/16 (from the past 48 hour(s))  Glucose, capillary     Status: Abnormal   Collection Time: 04/07/16  6:29 PM  Result Value Ref Range   Glucose-Capillary 167 (H) 65 - 99 mg/dL  Glucose, capillary     Status: Abnormal   Collection Time: 04/08/16  6:01 AM  Result Value Ref Range   Glucose-Capillary 177 (H) 65 - 99 mg/dL  Glucose, capillary     Status: Abnormal   Collection Time: 04/08/16 11:47 AM  Result Value Ref Range   Glucose-Capillary 182 (H) 65 - 99 mg/dL  Glucose, capillary     Status: Abnormal   Collection Time: 04/08/16  5:18 PM  Result Value Ref Range   Glucose-Capillary 179 (H) 65 - 99 mg/dL  Glucose, capillary     Status: Abnormal   Collection Time: 04/08/16  9:42 PM  Result Value Ref Range   Glucose-Capillary 227 (H) 65 - 99 mg/dL  Glucose, capillary     Status: Abnormal   Collection Time: 04/09/16  6:08 AM  Result Value Ref Range   Glucose-Capillary 175 (H) 65 - 99 mg/dL  TSH     Status: Abnormal   Collection Time: 04/09/16  6:47 AM  Result Value Ref Range   TSH 0.239 (L) 0.350 - 4.500 uIU/mL    Comment: Performed by a 3rd Generation assay with a functional sensitivity of <=0.01 uIU/mL. Performed at [facility]   Lipid panel     Status: Abnormal   Collection Time: 04/09/16  6:47 AM  Result Value Ref Range   Cholesterol 144 0 - 200 mg/dL   Triglycerides 161 <096 mg/dL   HDL 39 (L) >04 mg/dL   Total CHOL/HDL Ratio 3.7 RATIO   VLDL 24 0 - 40 mg/dL   LDL Cholesterol 81 0 - 99 mg/dL    Comment:        Total Cholesterol/HDL:CHD Risk Coronary  Heart Disease Risk Table  Men   Women  1/2 Average Risk   3.4   3.3  Average Risk       5.0   4.4  2 X Average Risk   9.6   7.1  3 X Average Risk  23.4   11.0        Use the calculated Patient Ratio above and the CHD Risk Table to determine the patient's CHD Risk.        ATP III CLASSIFICATION (LDL):  <100     mg/dL   Optimal  161-096  mg/dL   Near or Above                    Optimal  130-159  mg/dL   Borderline  045-409  mg/dL   High  >811     mg/dL   Very High Performed at [facility]   Glucose, capillary     Status: Abnormal   Collection Time: 04/09/16 12:06 PM  Result Value Ref Range   Glucose-Capillary 171 (H) 65 - 99 mg/dL    Blood Alcohol level:  Lab Results  Component Value Date   ETH <5 04/06/2016   ETH 44 (H) 01/12/2016    Metabolic Disorder Labs: Lab Results  Component Value Date   HGBA1C 9.0 (H) 06/03/2015   Lab Results  Component Value Date   PROLACTIN 74.0 (H) 06/03/2015   Lab Results  Component Value Date   CHOL 144 04/09/2016   TRIG 118 04/09/2016   HDL 39 (L) 04/09/2016   CHOLHDL 3.7 04/09/2016   VLDL 24 04/09/2016   LDLCALC 81 04/09/2016   LDLCALC 97 06/03/2015    Physical Findings: AIMS: Facial and Oral Movements Muscles of Facial Expression: None, normal Lips and Perioral Area: None, normal Jaw: None, normal Tongue: None, normal,Extremity Movements Upper (arms, wrists, hands, fingers): None, normal Lower (legs, knees, ankles, toes): None, normal, Trunk Movements Neck, shoulders, hips: None, normal, Overall Severity Severity of abnormal movements (highest score from questions above): None, normal Incapacitation due to abnormal movements: None, normal Patient's awareness of abnormal movements (rate only patient's report): No Awareness, Dental Status Current problems with teeth and/or dentures?: No Does patient usually wear dentures?: No  CIWA:  CIWA-Ar Total: 1 COWS:     Musculoskeletal: Strength &  Muscle Tone: within normal limits Gait & Station: normal Patient leans: N/A  Psychiatric Specialty Exam: Physical Exam  Nursing note and vitals reviewed.   Review of Systems  Psychiatric/Behavioral: Positive for depression and substance abuse. The patient is nervous/anxious.   All other systems reviewed and are negative.   Blood pressure 114/71, pulse 88, temperature 97.5 F (36.4 C), temperature source Oral, resp. rate 18, height 6\' 4"  (1.93 m), weight 97.5 kg (215 lb).Body mass index is 26.17 kg/m.  General Appearance: Guarded  Eye Contact:  Fair  Speech:  Clear and Coherent  Volume:  Normal  Mood:  Anxious  Affect:  Congruent  Thought Process:  Goal Directed and Descriptions of Associations: Circumstantial  Orientation:  Full (Time, Place, and Person)  Thought Content:  Logical and Rumination  Suicidal Thoughts:  No  Homicidal Thoughts:  No  Memory:  Immediate;   Fair Recent;   Fair Remote;   Fair  Judgement:  Impaired  Insight:  Fair  Psychomotor Activity:  Normal  Concentration:  Concentration: Fair and Attention Span: Fair  Recall:  [employer] of Knowledge:  Fair  Language:  Fair  Akathisia:  No  Handed:  Right  AIMS (if indicated):     Assets:  Communication Skills Desire for Improvement  ADL's:  Intact  Cognition:  WNL  Sleep:  Number of Hours: 6.75     Treatment Plan Summary:Patient today seen as guarded , tolerating medications well , continue to treat, motivated to get help with his substance abuse.  Paranoid schizophrenia (HCC) unstable - improving  Will continue today 04/09/16 plan as below except where it is noted.   Daily contact with patient to assess and evaluate symptoms and progress in treatment and Medication management For psychosis: Abilify  20 mg po daily. Abilify Maintena 400 mg IM q28 days - first dose 04/09/16.  For anxiety sx: Neurontin 300 mg po tid.  For sleep issues: Trazodone 100 mg po qhs.  For alcohol abuse/cocaine  abuse: Provided substance abuse counseling. CIWA/Ativan protocol. Pending substance abuse treatment program placement .  For diabetes melitus: CBGs as per unit protocol. Restarted insulin as per MAR.  Will continue to monitor vitals ,medication compliance and treatment side effects while patient is here.   Will monitor for medical issues as well as call consult as needed.   Reviewed labs-  WBC- 10.7, K+ - 3.4 - will repeat, BAL , 5  ,uds- negative , TSH- low - will get t3, t4 , LIPID PANEL- wnl ,pending HBA1C, PL.  Pending EKG for qtc.  CSW will continue working on disposition.   Patient to participate in therapeutic milieu .     Recardo Linn, MD 04/09/2016, 1:17 PM

## 2016-04-10 DIAGNOSIS — F17210 Nicotine dependence, cigarettes, uncomplicated: Secondary | ICD-10-CM

## 2016-04-10 LAB — GLUCOSE, CAPILLARY
GLUCOSE-CAPILLARY: 202 mg/dL — AB (ref 65–99)
Glucose-Capillary: 238 mg/dL — ABNORMAL HIGH (ref 65–99)
Glucose-Capillary: 222 mg/dL — ABNORMAL HIGH (ref 65–99)
Glucose-Capillary: 134 mg/dL — ABNORMAL HIGH (ref 65–99)

## 2016-04-10 LAB — HEMOGLOBIN A1C
HEMOGLOBIN A1C: 11.2 % — AB (ref 4.8–5.6)
Mean Plasma Glucose: 275 mg/dL

## 2016-04-10 LAB — T3: T3 TOTAL: 97 ng/dL (ref 71–180)

## 2016-04-10 LAB — PROLACTIN: Prolactin: 13.3 ng/mL (ref 4.0–15.2)

## 2016-04-10 NOTE — Progress Notes (Signed)
Patient ID: Steven Christensen, male   DOB: 05/03/1970, 46 y.o.   MRN: [id number]  D: Patient in room on approach. Pt mood and affect appeared depressed and flat. Pt reports tolerating medication well and ready to discharge. Pt denies SI/HI/AVH and pain. Pt attended and participated in evening wrap up group. Cooperative with assessment. A: Medications administered as prescribed. Support and encouragement provided as needed. Pt educated on better food choice to manage diabetics. Pt encouraged to discuss feelings and come to staff with any question or concerns.  R: Patient remains safe and complaint with medications.

## 2016-04-10 NOTE — Progress Notes (Signed)
Adult Psychoeducational Group Note  Date:  04/10/2016 Time:  2:10 PM  Group Topic/Focus:  Coping With Mental Health Crisis:   The purpose of this group is to help patients identify strategies for coping with mental health crisis.  Group discusses possible causes of crisis and ways to manage them effectively.   Participation Level:  Active  Participation Quality:  Appropriate  Affect:  Appropriate  Cognitive:  Alert and Appropriate  Insight: Limited  Engagement in Group:  None  Modes of Intervention:  Discussion, Education and Problem-solving  Additional Comments:    Georgeanna Harrisonheresa A Olyvia Gopal 04/10/2016, 2:10 PM

## 2016-04-10 NOTE — Progress Notes (Signed)
Adult Psychoeducational Group Note  Date:  04/10/2016 Time:  8:38 PM  Group Topic/Focus:  Wrap-Up Group:   The focus of this group is to help patients review their daily goal of treatment and discuss progress on daily workbooks.   Participation Level:  Active  Participation Quality:  Appropriate  Affect:  Appropriate  Cognitive:  Appropriate  Insight: Appropriate  Engagement in Group:  Engaged  Modes of Intervention:  Discussion  Additional Comments:  The patient expressed that he rates today a 8.The patient also said that he attended groups. Octavio Mannshigpen, Azari Hasler Lee 04/10/2016, 8:38 PM

## 2016-04-10 NOTE — Progress Notes (Signed)
DAR NOTE: Patient presents with anxious affect and depressed mood.  Denies auditory and visual hallucinations.  Described energy level as normal and concentration as good.  Rates depression at 1, hopelessness at 1, and anxiety at 1.  Maintained on routine safety checks.  Medications given as prescribed.  Support and encouragement offered as needed.  Attended group and participated.  States goal for today is "to get my mind right."  Patient observed socializing with peers in the dayroom.  Tylenol 650 mg given for complain of leg and back pain with good effect.

## 2016-04-10 NOTE — [facility] Group Notes (Signed)
[facility] Group Notes:  (Clinical Social Work)  04/10/2016  11:15-12:00PM  Summary of Progress/Problems:   Today's process group involved patients discussing their feelings related to being hospitalized, as well as benefits they see to being in the hospital.  These were itemized on the whiteboard, and then the group brainstormed specific ways in which they could seek for those same benefits to happen when they discharge and go back home. The patient expressed a primary feeling about being hospitalized is "pretty good" because it was needed.  He was late to group, participated appropriately but was in and out of the room.  Type of Therapy:  Group Therapy - Process  Participation Level:  Active  Participation Quality:  Attentive and Sharing  Affect:  Appropriate  Cognitive:  Alert  Insight:  Improving  Engagement in Therapy:  Improving  Modes of Intervention:  Exploration, Discussion  Ambrose MantleMareida Grossman-Orr, LCSW 04/10/2016, 2:48 PM

## 2016-04-10 NOTE — Plan of Care (Signed)
Problem: Safety: Goal: Periods of time without injury will increase Outcome: Progressing Pt safe on the unit at this time   

## 2016-04-10 NOTE — Progress Notes (Signed)
[facility] MD Progress Note  04/10/2016 1:58 PM Steven Christensen  MRN:  [id number] Subjective: Patient states " I spoke to Rod and Dr. Shea Evans about leaving Monday. I may try to go to [facility] or another rehab place. I gotta get clean for my family and for my 48moold daughter.   Objective:Steven Waltersis a 46y.o.AA male, who has a hx of schizophrenia , alcohol, cocaine abuse, noncompliance with medication, who presented to ED due to increased hallucinations and suicidal thoughts per EHR.  Pt seen and chart reviewed. Pt is alert/oriented x4, calm, cooperative, and appropriate to situation. Pt denies suicidal/homicidal ideation and psychosis and does not appear to be responding to internal stimuli. Pt reports that he is supposed to go home on Monday and that Rod (SW) is helping him set up everything he needs.      Principal Problem: Paranoid schizophrenia ([place] Diagnosis:   Patient Active Problem List   Diagnosis Date Noted  . Paranoid schizophrenia ([place] [F20.0] 04/08/2016  . Alcohol use disorder, moderate, dependence ([place] [F10.20] 04/08/2016  . Cocaine use disorder, moderate, dependence ([place] [F14.20] 04/08/2016  . Diabetes ([place] [E11.9] 06/02/2015  . Tobacco use disorder [F17.200] 06/02/2015  . Hemorrhoids [K64.9] 08/02/2013   Total Time spent with patient: 20 minutes  Past Psychiatric History: Please see H&P.   Past Medical History:  Past Medical History:  Diagnosis Date  . Anxiety   . Bipolar affective ([place]   . Depression   . Diabetes mellitus   . [place]ia ([place]   . Sleeping difficulties    History reviewed. No pertinent surgical history. Family History:  Family History  Problem Relation Age of Onset  . Diabetes Maternal Aunt   . Mental illness Neg Hx    Family Psychiatric  History: Please see H&P.  Social History:  History  Alcohol Use  . Yes     History  Drug Use  . Types: "Crack" cocaine, Marijuana    Social History   Social History  . Marital status:  Single    Spouse name: N/A  . Number of children: N/A  . Years of education: N/A   Social History Main Topics  . Smoking status: Current Every Day Smoker    Packs/day: 1.00    Years: 35.00    Types: Cigarettes  . Smokeless tobacco: Never Used  . Alcohol use Yes  . Drug use:     Types: "Crack" cocaine, Marijuana  . Sexual activity: Yes    Birth control/ protection: None   Other Topics Concern  . None   Social History Narrative  . None   Additional Social History:                         Sleep: Good  Appetite:  Good  Current Medications: Current Facility-Administered Medications  Medication Dose Route Frequency Provider Last Rate Last Dose  . acetaminophen (TYLENOL) tablet 650 mg  650 mg Oral Q6H PRN JPatrecia Pour NP      . alum & mag hydroxide-simeth (MAALOX/MYLANTA) 200-200-20 MG/5ML suspension 30 mL  30 mL Oral Q4H PRN JPatrecia Pour NP      . ARIPiprazole (ABILIFY) tablet 20 mg  20 mg Oral Daily JPatrecia Pour NP   20 mg at 04/10/16 0841  . ARIPiprazole ER SRER 400 mg  400 mg Intramuscular Q30 days SUrsula Alert MD   400 mg at 04/09/16 1058  . gabapentin (NEURONTIN) capsule 300 mg  300 mg Oral TID  Patrecia Pour, NP   300 mg at 04/10/16 1212  . hydrOXYzine (ATARAX/VISTARIL) tablet 25 mg  25 mg Oral Q4H PRN Ursula Alert, MD   25 mg at 04/09/16 2122  . insulin aspart (novoLOG) injection 0-15 Units  0-15 Units Subcutaneous TID WC Ursula Alert, MD   5 Units at 04/10/16 1212  . insulin aspart (novoLOG) injection 0-5 Units  0-5 Units Subcutaneous QHS Ursula Alert, MD   2 Units at 04/08/16 2154  . insulin glargine (LANTUS) injection 40 Units  40 Units Subcutaneous Daily Patrecia Pour, NP   40 Units at 04/10/16 [phone]  . LORazepam (ATIVAN) tablet 1 mg  1 mg Oral Q6H PRN Saramma Eappen, MD      . magnesium hydroxide (MILK OF MAGNESIA) suspension 30 mL  30 mL Oral Daily PRN Patrecia Pour, NP      . metFORMIN (GLUCOPHAGE) tablet 500 mg  500 mg Oral BID WC  Patrecia Pour, NP   500 mg at 04/10/16 0841  . nicotine (NICODERM CQ - dosed in mg/24 hours) patch 21 mg  21 mg Transdermal Daily Patrecia Pour, NP   21 mg at 04/10/16 2409  . OLANZapine (ZYPREXA) tablet 10 mg  10 mg Oral TID PRN Ursula Alert, MD       Or  . OLANZapine (ZYPREXA) injection 10 mg  10 mg Intramuscular TID PRN Ursula Alert, MD      . traZODone (DESYREL) tablet 100 mg  100 mg Oral QHS Patrecia Pour, NP   100 mg at 04/09/16 2122    Lab Results:  Results for orders placed or performed during the hospital encounter of 04/07/16 (from the past 48 hour(s))  Glucose, capillary     Status: Abnormal   Collection Time: 04/08/16  5:18 PM  Result Value Ref Range   Glucose-Capillary 179 (H) 65 - 99 mg/dL  Glucose, capillary     Status: Abnormal   Collection Time: 04/08/16  9:42 PM  Result Value Ref Range   Glucose-Capillary 227 (H) 65 - 99 mg/dL  Glucose, capillary     Status: Abnormal   Collection Time: 04/09/16  6:08 AM  Result Value Ref Range   Glucose-Capillary 175 (H) 65 - 99 mg/dL  Hemoglobin A1c     Status: Abnormal   Collection Time: 04/09/16  6:47 AM  Result Value Ref Range   Hgb A1c MFr Bld 11.2 (H) 4.8 - 5.6 %    Comment: (NOTE)         Pre-diabetes: 5.7 - 6.4         Diabetes: >6.4         Glycemic control for adults with diabetes: <7.0    Mean Plasma Glucose 275 mg/dL    Comment: (NOTE) Performed At: [facility] [place], [place] [id number] Lindon Romp MD [id number] Performed at [facility]   TSH     Status: Abnormal   Collection Time: 04/09/16  6:47 AM  Result Value Ref Range   TSH 0.239 (L) 0.350 - 4.500 uIU/mL    Comment: Performed by a 3rd Generation assay with a functional sensitivity of <=0.01 uIU/mL. Performed at [facility]   Lipid panel     Status: Abnormal   Collection Time: 04/09/16  6:47 AM  Result Value Ref Range   Cholesterol 144 0 - 200 mg/dL   Triglycerides 118 <150  mg/dL   HDL 39 (L) >40 mg/dL   Total CHOL/HDL Ratio  3.7 RATIO   VLDL 24 0 - 40 mg/dL   LDL Cholesterol 81 0 - 99 mg/dL    Comment:        Total Cholesterol/HDL:CHD Risk Coronary Heart Disease Risk Table                     Men   Women  1/2 Average Risk   3.4   3.3  Average Risk       5.0   4.4  2 X Average Risk   9.6   7.1  3 X Average Risk  23.4   11.0        Use the calculated Patient Ratio above and the CHD Risk Table to determine the patient's CHD Risk.        ATP III CLASSIFICATION (LDL):  <100     mg/dL   Optimal  100-129  mg/dL   Near or Above                    Optimal  130-159  mg/dL   Borderline  160-189  mg/dL   High  >190     mg/dL   Very High Performed at [facility]   Prolactin     Status: None   Collection Time: 04/09/16  6:47 AM  Result Value Ref Range   Prolactin 13.3 4.0 - 15.2 ng/mL    Comment: (NOTE) Performed At: [facility] [place], [place] [id number] Lindon Romp MD [id number] Performed at [facility]   Glucose, capillary     Status: Abnormal   Collection Time: 04/09/16 12:06 PM  Result Value Ref Range   Glucose-Capillary 171 (H) 65 - 99 mg/dL  Glucose, capillary     Status: Abnormal   Collection Time: 04/09/16  5:09 PM  Result Value Ref Range   Glucose-Capillary 246 (H) 65 - 99 mg/dL  T4, free     Status: None   Collection Time: 04/09/16  6:18 PM  Result Value Ref Range   Free T4 0.79 0.61 - 1.12 ng/dL    Comment: (NOTE) Biotin ingestion may interfere with free T4 tests. If the results are inconsistent with the TSH level, previous test results, or the clinical presentation, then consider biotin interference. If needed, order repeat testing after stopping biotin. Performed at [facility]   T3     Status: None   Collection Time: 04/09/16  6:51 PM  Result Value Ref Range   T3, Total 97 71 - 180 ng/dL    Comment: (NOTE) Performed At: [facility] [place], [place] [id number] Lindon Romp MD [id number] Performed at [facility]   Comprehensive metabolic panel     Status: Abnormal   Collection Time: 04/09/16  6:51 PM  Result Value Ref Range   Sodium 141 135 - 145 mmol/L   Potassium 3.7 3.5 - 5.1 mmol/L   Chloride 102 101 - 111 mmol/L   CO2 31 22 - 32 mmol/L   Glucose, Bld 134 (H) 65 - 99 mg/dL   BUN 9 6 - 20 mg/dL   Creatinine, Ser 0.86 0.61 - 1.24 mg/dL   Calcium 9.5 8.9 - 10.3 mg/dL   Total Protein 7.6 6.5 - 8.1 g/dL   Albumin 4.3 3.5 - 5.0 g/dL   AST 16 15 - 41 U/L   ALT 16 (L) 17 - 63 U/L   Alkaline Phosphatase 84 38 - 126 U/L  Total Bilirubin 0.4 0.3 - 1.2 mg/dL   GFR calc non Af Amer >60 >60 mL/min   GFR calc Af Amer >60 >60 mL/min    Comment: (NOTE) The eGFR has been calculated using the CKD EPI equation. This calculation has not been validated in all clinical situations. eGFR's persistently <60 mL/min signify possible Chronic Kidney Disease.    Anion gap 8 5 - 15    Comment: Performed at [facility]  Glucose, capillary     Status: Abnormal   Collection Time: 04/09/16  8:41 PM  Result Value Ref Range   Glucose-Capillary 167 (H) 65 - 99 mg/dL  Glucose, capillary     Status: Abnormal   Collection Time: 04/10/16  5:58 AM  Result Value Ref Range   Glucose-Capillary 238 (H) 65 - 99 mg/dL  Glucose, capillary     Status: Abnormal   Collection Time: 04/10/16 11:53 AM  Result Value Ref Range   Glucose-Capillary 202 (H) 65 - 99 mg/dL    Blood Alcohol level:  Lab Results  Component Value Date   ETH <5 04/06/2016   ETH 44 (H) 31/59/4585    Metabolic Disorder Labs: Lab Results  Component Value Date   HGBA1C 11.2 (H) 04/09/2016   MPG 275 04/09/2016   Lab Results  Component Value Date   PROLACTIN 13.3 04/09/2016   PROLACTIN 74.0 (H) 06/03/2015   Lab Results  Component Value Date   CHOL 144 04/09/2016   TRIG 118 04/09/2016   HDL 39 (L) 04/09/2016   CHOLHDL 3.7  04/09/2016   VLDL 24 04/09/2016   LDLCALC 81 04/09/2016   LDLCALC 97 06/03/2015    Physical Findings: AIMS: Facial and Oral Movements Muscles of Facial Expression: None, normal Lips and Perioral Area: None, normal Jaw: None, normal Tongue: None, normal,Extremity Movements Upper (arms, wrists, hands, fingers): None, normal Lower (legs, knees, ankles, toes): None, normal, Trunk Movements Neck, shoulders, hips: None, normal, Overall Severity Severity of abnormal movements (highest score from questions above): None, normal Incapacitation due to abnormal movements: None, normal Patient's awareness of abnormal movements (rate only patient's report): No Awareness, Dental Status Current problems with teeth and/or dentures?: No Does patient usually wear dentures?: No  CIWA:  CIWA-Ar Total: 1 COWS:     Musculoskeletal: Strength & Muscle Tone: within normal limits Gait & Station: normal Patient leans: N/A  Psychiatric Specialty Exam: Physical Exam  Nursing note and vitals reviewed.   Review of Systems  Psychiatric/Behavioral: Positive for depression and substance abuse. The patient is nervous/anxious.   All other systems reviewed and are negative.   Blood pressure 108/68, pulse 91, temperature 97.6 F (36.4 C), temperature source Oral, resp. rate 20, height 6' 4"  (1.93 m), weight 97.5 kg (215 lb).Body mass index is 26.17 kg/m.  General Appearance: Casual and Fairly Groomed  Eye Contact:  Good  Speech:  Clear and Coherent and Normal Rate  Volume:  Normal  Mood:  Euthymic  Affect:  Congruent  Thought Process:  Goal Directed and Descriptions of Associations: Circumstantial  Orientation:  Full (Time, Place, and Person)  Thought Content:  Focused on rehab and discharge planning  Suicidal Thoughts:  No  Homicidal Thoughts:  No  Memory:  Immediate;   Fair Recent;   Fair Remote;   Fair  Judgement:  Fair  Insight:  Fair  Psychomotor Activity:  Normal  Concentration:   Concentration: Fair and Attention Span: Fair  Recall:  [employer] of Knowledge:  Fair  Language:  Fair  Akathisia:  No  Handed:  Right  AIMS (if indicated):     Assets:  Communication Skills Desire for Improvement  ADL's:  Intact  Cognition:  WNL  Sleep:  Number of Hours: 6     Paranoid schizophrenia ([place]) with substance abuse, unstable - improving  Will continue today 04/10/16 plan as below except where it is noted. Pt is making great progress and should be able to discharge early in the week.    Daily contact with patient to assess and evaluate symptoms and progress in treatment and Medication management For psychosis: Abilify  20 mg po daily. Abilify Maintena 400 mg IM q28 days - first dose 04/09/16.  For anxiety sx: Neurontin 300 mg po tid.  For sleep issues: Trazodone 100 mg po qhs.  For alcohol abuse/cocaine abuse: Provided substance abuse counseling. CIWA/Ativan protocol. Pending substance abuse treatment program placement .  For diabetes melitus: CBGs as per unit protocol. Restarted insulin as per MAR.  Will continue to monitor vitals ,medication compliance and treatment side effects while patient is here.   Will monitor for medical issues as well as call consult as needed.   Reviewed labs-  WBC- 10.7, K+ - 3.4 - will repeat, BAL , 5  ,uds- negative , TSH- low - will get t3, t4 , LIPID PANEL- wnl ,pending HBA1C, PL.  EKG never done, ordered again  CSW will continue working on disposition.   Patient to participate in therapeutic milieu .     Benjamine Mola, FNP 04/10/2016, 1:58 PM  I agree to notes and plan.

## 2016-04-10 NOTE — Progress Notes (Signed)
D: Pt denies SI/HI/AVH. Pt is pleasant and cooperative. Pt stated he needed help so he decided to come in.   A: Pt was offered support and encouragement. Pt was given scheduled medications. Pt was encourage to attend groups. Q 15 minute checks were done for safety.   R:Pt attends groups and interacts well with peers and staff. Pt is taking medication. Pt has no complaints at this time.Pt receptive to treatment and safety maintained on unit.

## 2016-04-11 LAB — GLUCOSE, CAPILLARY
GLUCOSE-CAPILLARY: 236 mg/dL — AB (ref 65–99)
Glucose-Capillary: 179 mg/dL — ABNORMAL HIGH (ref 65–99)
Glucose-Capillary: 294 mg/dL — ABNORMAL HIGH (ref 65–99)
Glucose-Capillary: 154 mg/dL — ABNORMAL HIGH (ref 65–99)

## 2016-04-11 NOTE — Progress Notes (Signed)
Pt attend group. Patient's day was a 7. Pt said he need to stay clean. He do not want to hear voices need to get thnings together. Pt said he do not have a goal.

## 2016-04-11 NOTE — Progress Notes (Signed)
D:  Patient's self inventory sheet, patient sleeps good, sleep medication helpful.  Good appetite, normal energy level, good concentration.  Rated depression, hopeless and anxiety #6.  Denied withdrawals.  Denied SI.  Denied physical problems.  Denied pain.  No discharge plans. A:  Medications administered per MD orders.  Emotional support and and encouragement given patient. R:  Patient denied SI and HI, contracts for safety.  Denied A/V hallucinations.  Safety maintained with 15 minute checks.

## 2016-04-11 NOTE — Plan of Care (Signed)
Problem: Education: Goal: Ability to make informed decisions regarding treatment will improve Outcome: Progressing Nurse discussed depression/coping skills with patient.   

## 2016-04-11 NOTE — Progress Notes (Signed)
[facility] MD Progress Note  04/11/2016 3:03 PM Steven Christensen  MRN:  [id number] Subjective: Patient states " I am having a good day today."  Objective:Demetri Rosier is awake, alert and oriented *3. Seen resting in bedroom interacting with his roommate. Denies suicidal or homicidal ideation. Denies auditory or visual hallucination and does not appear to be responding to internal stimuli. Patient reports interacting  well with staff and others. Patient reports he is medication compliant without mediation side effects.  Denies depression or depressive symptoms. Reports good appetite and states he is  resting well. Patient report he is excited about  Discharge on tomorrow. Support, encouragement and reassurance was provided.   Principal Problem: Paranoid schizophrenia ([place]) Diagnosis:   Patient Active Problem List   Diagnosis Date Noted  . Paranoid schizophrenia ([place]) [F20.0] 04/08/2016  . Alcohol use disorder, moderate, dependence ([place]) [F10.20] 04/08/2016  . Cocaine use disorder, moderate, dependence ([place]) [F14.20] 04/08/2016  . Diabetes ([place]) [E11.9] 06/02/2015  . Tobacco use disorder [F17.200] 06/02/2015  . Hemorrhoids [K64.9] 08/02/2013   Total Time spent with patient: 20 minutes  Past Psychiatric History: Please see H&P.   Past Medical History:  Past Medical History:  Diagnosis Date  . Anxiety   . Bipolar affective ([place])   . Depression   . Diabetes mellitus   . Schizophrenia ([place])   . Sleeping difficulties    History reviewed. No pertinent surgical history. Family History:  Family History  Problem Relation Age of Onset  . Diabetes Maternal Aunt   . Mental illness Neg Hx    Family Psychiatric  History: Please see H&P.  Social History:  History  Alcohol Use  . Yes     History  Drug Use  . Types: "Crack" cocaine, Marijuana    Social History   Social History  . Marital status: Single    Spouse name: N/A  . Number of children: N/A  . Years of education: N/A   Social  History Main Topics  . Smoking status: Current Every Day Smoker    Packs/day: 1.00    Years: 35.00    Types: Cigarettes  . Smokeless tobacco: Never Used  . Alcohol use Yes  . Drug use:     Types: "Crack" cocaine, Marijuana  . Sexual activity: Yes    Birth control/ protection: None   Other Topics Concern  . None   Social History Narrative  . None   Additional Social History:                         Sleep: Good  Appetite:  Good  Current Medications: Current Facility-Administered Medications  Medication Dose Route Frequency Provider Last Rate Last Dose  . acetaminophen (TYLENOL) tablet 650 mg  650 mg Oral Q6H PRN Patrecia Pour, NP   650 mg at 04/10/16 1539  . alum & mag hydroxide-simeth (MAALOX/MYLANTA) 200-200-20 MG/5ML suspension 30 mL  30 mL Oral Q4H PRN Patrecia Pour, NP      . ARIPiprazole (ABILIFY) tablet 20 mg  20 mg Oral Daily Patrecia Pour, NP   20 mg at 04/11/16 0735  . ARIPiprazole ER SRER 400 mg  400 mg Intramuscular Q30 days Ursula Alert, MD   400 mg at 04/09/16 1058  . gabapentin (NEURONTIN) capsule 300 mg  300 mg Oral TID Patrecia Pour, NP   300 mg at 04/11/16 1204  . hydrOXYzine (ATARAX/VISTARIL) tablet 25 mg  25 mg Oral Q4H PRN Ursula Alert, MD  25 mg at 04/10/16 2131  . insulin aspart (novoLOG) injection 0-15 Units  0-15 Units Subcutaneous TID WC Ursula Alert, MD   5 Units at 04/11/16 1201  . insulin aspart (novoLOG) injection 0-5 Units  0-5 Units Subcutaneous QHS Ursula Alert, MD   2 Units at 04/08/16 2154  . insulin glargine (LANTUS) injection 40 Units  40 Units Subcutaneous Daily Patrecia Pour, NP   40 Units at 04/11/16 7353  . LORazepam (ATIVAN) tablet 1 mg  1 mg Oral Q6H PRN Saramma Eappen, MD      . magnesium hydroxide (MILK OF MAGNESIA) suspension 30 mL  30 mL Oral Daily PRN Patrecia Pour, NP      . metFORMIN (GLUCOPHAGE) tablet 500 mg  500 mg Oral BID WC Patrecia Pour, NP   500 mg at 04/11/16 0735  . nicotine (NICODERM CQ -  dosed in mg/24 hours) patch 21 mg  21 mg Transdermal Daily Patrecia Pour, NP   21 mg at 04/11/16 0734  . OLANZapine (ZYPREXA) tablet 10 mg  10 mg Oral TID PRN Ursula Alert, MD       Or  . OLANZapine (ZYPREXA) injection 10 mg  10 mg Intramuscular TID PRN Ursula Alert, MD      . traZODone (DESYREL) tablet 100 mg  100 mg Oral QHS Patrecia Pour, NP   100 mg at 04/10/16 2131    Lab Results:  Results for orders placed or performed during the hospital encounter of 04/07/16 (from the past 48 hour(s))  Glucose, capillary     Status: Abnormal   Collection Time: 04/09/16  5:09 PM  Result Value Ref Range   Glucose-Capillary 246 (H) 65 - 99 mg/dL  T4, free     Status: None   Collection Time: 04/09/16  6:18 PM  Result Value Ref Range   Free T4 0.79 0.61 - 1.12 ng/dL    Comment: (NOTE) Biotin ingestion may interfere with free T4 tests. If the results are inconsistent with the TSH level, previous test results, or the clinical presentation, then consider biotin interference. If needed, order repeat testing after stopping biotin. Performed at [facility]   T3     Status: None   Collection Time: 04/09/16  6:51 PM  Result Value Ref Range   T3, Total 97 71 - 180 ng/dL    Comment: (NOTE) Performed At: [facility] [place], [place] [id number] Lindon Romp MD [id number] Performed at [facility]   Comprehensive metabolic panel     Status: Abnormal   Collection Time: 04/09/16  6:51 PM  Result Value Ref Range   Sodium 141 135 - 145 mmol/L   Potassium 3.7 3.5 - 5.1 mmol/L   Chloride 102 101 - 111 mmol/L   CO2 31 22 - 32 mmol/L   Glucose, Bld 134 (H) 65 - 99 mg/dL   BUN 9 6 - 20 mg/dL   Creatinine, Ser 0.86 0.61 - 1.24 mg/dL   Calcium 9.5 8.9 - 10.3 mg/dL   Total Protein 7.6 6.5 - 8.1 g/dL   Albumin 4.3 3.5 - 5.0 g/dL   AST 16 15 - 41 U/L   ALT 16 (L) 17 - 63 U/L   Alkaline Phosphatase 84 38 - 126 U/L   Total Bilirubin 0.4 0.3 - 1.2  mg/dL   GFR calc non Af Amer >60 >60 mL/min   GFR calc Af Amer >60 >60 mL/min    Comment: (NOTE) The eGFR has  been calculated using the CKD EPI equation. This calculation has not been validated in all clinical situations. eGFR's persistently <60 mL/min signify possible Chronic Kidney Disease.    Anion gap 8 5 - 15    Comment: Performed at [facility]  Glucose, capillary     Status: Abnormal   Collection Time: 04/09/16  8:41 PM  Result Value Ref Range   Glucose-Capillary 167 (H) 65 - 99 mg/dL  Glucose, capillary     Status: Abnormal   Collection Time: 04/10/16  5:58 AM  Result Value Ref Range   Glucose-Capillary 238 (H) 65 - 99 mg/dL  Glucose, capillary     Status: Abnormal   Collection Time: 04/10/16 11:53 AM  Result Value Ref Range   Glucose-Capillary 202 (H) 65 - 99 mg/dL  Glucose, capillary     Status: Abnormal   Collection Time: 04/10/16  4:58 PM  Result Value Ref Range   Glucose-Capillary 222 (H) 65 - 99 mg/dL  Glucose, capillary     Status: Abnormal   Collection Time: 04/10/16  8:34 PM  Result Value Ref Range   Glucose-Capillary 134 (H) 65 - 99 mg/dL  Glucose, capillary     Status: Abnormal   Collection Time: 04/11/16  6:14 AM  Result Value Ref Range   Glucose-Capillary 179 (H) 65 - 99 mg/dL  Glucose, capillary     Status: Abnormal   Collection Time: 04/11/16 11:53 AM  Result Value Ref Range   Glucose-Capillary 236 (H) 65 - 99 mg/dL   Comment 1 Notify RN    Comment 2 Document in Chart     Blood Alcohol level:  Lab Results  Component Value Date   ETH <5 04/06/2016   ETH 44 (H) 98/92/1194    Metabolic Disorder Labs: Lab Results  Component Value Date   HGBA1C 11.2 (H) 04/09/2016   MPG 275 04/09/2016   Lab Results  Component Value Date   PROLACTIN 13.3 04/09/2016   PROLACTIN 74.0 (H) 06/03/2015   Lab Results  Component Value Date   CHOL 144 04/09/2016   TRIG 118 04/09/2016   HDL 39 (L) 04/09/2016   CHOLHDL 3.7 04/09/2016   VLDL  24 04/09/2016   LDLCALC 81 04/09/2016   LDLCALC 97 06/03/2015    Physical Findings: AIMS: Facial and Oral Movements Muscles of Facial Expression: None, normal Lips and Perioral Area: None, normal Jaw: None, normal Tongue: None, normal,Extremity Movements Upper (arms, wrists, hands, fingers): None, normal Lower (legs, knees, ankles, toes): None, normal, Trunk Movements Neck, shoulders, hips: None, normal, Overall Severity Severity of abnormal movements (highest score from questions above): None, normal Incapacitation due to abnormal movements: None, normal Patient's awareness of abnormal movements (rate only patient's report): No Awareness, Dental Status Current problems with teeth and/or dentures?: No Does patient usually wear dentures?: No  CIWA:  CIWA-Ar Total: 1 COWS:     Musculoskeletal: Strength & Muscle Tone: within normal limits Gait & Station: normal Patient leans: N/A  Psychiatric Specialty Exam: Physical Exam  Nursing note and vitals reviewed. Constitutional: He is oriented to person, place, and time.  Cardiovascular: Normal rate.   Neurological: He is alert and oriented to person, place, and time.  Psychiatric: He has a normal mood and affect. His behavior is normal.    Review of Systems  Psychiatric/Behavioral: Positive for depression and substance abuse. The patient is nervous/anxious.   All other systems reviewed and are negative.   Blood pressure 130/69, pulse 95, temperature 98.7 F (37.1 C), temperature source  Oral, resp. rate 18, height 6' 4"  (1.93 m), weight 97.5 kg (215 lb).Body mass index is 26.17 kg/m.  General Appearance: Casual and Fairly Groomed  Eye Contact:  Good  Speech:  Clear and Coherent and Normal Rate  Volume:  Normal  Mood:  Euthymic  Affect:  Congruent  Thought Process:  Goal Directed and Descriptions of Associations: Circumstantial  Orientation:  Full (Time, Place, and Person)  Thought Content:  Focused on rehab and discharge  planning  Suicidal Thoughts:  No  Homicidal Thoughts:  No  Memory:  Immediate;   Fair Recent;   Fair Remote;   Fair  Judgement:  Fair  Insight:  Fair  Psychomotor Activity:  Normal  Concentration:  Concentration: Fair and Attention Span: Fair  Recall:  [employer] of Knowledge:  Fair  Language:  Fair  Akathisia:  No  Handed:  Right  AIMS (if indicated):     Assets:  Communication Skills Desire for Improvement  ADL's:  Intact  Cognition:  WNL  Sleep:  Number of Hours: 5     Paranoid schizophrenia ([place]) with substance abuse, unstable - improving  Will continue today 04/11/16 plan as below except where it is noted. Pt is making great progress and should be able to discharge early in the week.    Daily contact with patient to assess and evaluate symptoms and progress in treatment and Medication management For psychosis: Abilify  20 mg po daily. Abilify Maintena 400 mg IM q28 days - first dose 04/09/16.  For anxiety sx: Neurontin 300 mg po tid.  For sleep issues: Trazodone 100 mg po qhs.  For alcohol abuse/cocaine abuse: Provided substance abuse counseling. CIWA/Ativan protocol. Pending substance abuse treatment program placement .  For diabetes melitus: CBGs as per unit protocol. Restarted insulin as per MAR. Will continue to monitor vitals ,medication compliance and treatment side effects while patient is here.  Will monitor for medical issues as well as call consult as needed.  Reviewed labs-  WBC- 10.7, K+ - 3.4 - will repeat, BAL , 5  ,uds- negative , TSH- low - will get t3, t4 , LIPID PANEL- wnl ,pending HBA1C, PL. EKG never done, ordered again CSW will continue working on disposition.  Patient to participate in therapeutic milieu .     Derrill Center, NP 04/11/2016, 3:03 PM Agree to notes and plan

## 2016-04-11 NOTE — [facility] Group Notes (Signed)
[facility] Group Notes: (Clinical Social Work)   04/11/2016      Type of Therapy:  Group Therapy   Participation Level:  Did Not Attend despite MHT prompting   [place] Huberty Grossman-Orr, LCSW 04/11/2016, 11:55 AM     

## 2016-04-12 LAB — GLUCOSE, CAPILLARY
GLUCOSE-CAPILLARY: 176 mg/dL — AB (ref 65–99)
Glucose-Capillary: 192 mg/dL — ABNORMAL HIGH (ref 65–99)

## 2016-04-12 MED ORDER — TRAZODONE HCL 100 MG PO TABS: 100.0000 mg | ORAL_TABLET | Freq: Every day | ORAL | 0 refills | Status: DC

## 2016-04-12 MED ORDER — GABAPENTIN 300 MG PO CAPS: 300.0000 mg | ORAL_CAPSULE | Freq: Three times a day (TID) | ORAL | 0 refills | Status: DC

## 2016-04-12 MED ORDER — METFORMIN HCL 500 MG PO TABS: 500.0000 mg | ORAL_TABLET | Freq: Two times a day (BID) | ORAL | 0 refills | Status: DC

## 2016-04-12 MED ORDER — NICOTINE 21 MG/24HR TD PT24
21.0000 mg | MEDICATED_PATCH | Freq: Every day | TRANSDERMAL | 0 refills | Status: DC
Start: 2016-04-13 — End: 2018-01-07

## 2016-04-12 MED ORDER — ARIPIPRAZOLE ER 400 MG IM SRER: 400.0000 mg | INTRAMUSCULAR | 0 refills | Status: DC

## 2016-04-12 MED ORDER — INSULIN GLARGINE 100 UNIT/ML ~~LOC~~ SOLN: 40.0000 [IU] | SUBCUTANEOUS | 11 refills | Status: DC

## 2016-04-12 MED ORDER — ARIPIPRAZOLE 20 MG PO TABS: 20.0000 mg | ORAL_TABLET | Freq: Every day | ORAL | 0 refills | Status: DC

## 2016-04-12 NOTE — Plan of Care (Signed)
Problem: [facility] Participation in Recreation Therapeutic Interventions Goal: STG-Patient will identify at least five coping skills for ** STG: Coping Skills - Patient will be able to identify at least 5 coping skills for suicidal thoughts by conclusion of recreation therapy tx  Outcome: Completed/Met Date Met: 04/12/16 Pt was able to identify coping skills at the completion coping skills and stress management recreation therapy sessions.   , LRT/CTRS   

## 2016-04-12 NOTE — Discharge Summary (Signed)
Physician Discharge Summary Note  Patient:  Steven Christensen Warnell is an 46 y.o., male MRN:  [id number] DOB:  03/23/1971 Patient phone:  [phone] (home)  Patient address:   [address] [place] [id number],  Total Time spent with patient: 30 minutes  Date of Admission:  04/07/2016 Date of Discharge: 04/12/2016  Reason for Admission:  Auditory hallucinations  Principal Problem: Paranoid schizophrenia [facility](HCC) Discharge Diagnoses: Patient Active Problem List   Diagnosis Date Noted  . Paranoid schizophrenia (HCC) [F20.0] 04/08/2016  . Alcohol use disorder, moderate, dependence ([facility]) [F10.20] 04/08/2016  . Cocaine use disorder, moderate, dependence ([facility]) [F14.20] 04/08/2016  . Diabetes ([facility]) [E11.9] 06/02/2015  . Tobacco use disorder [F17.200] 06/02/2015  . Hemorrhoids [K64.9] 08/02/2013    Past Psychiatric History: see HPI  Past Medical History:  Past Medical History:  Diagnosis Date  . Anxiety   . Bipolar affective (HCC)   . Depression   . Diabetes mellitus   . Schizophrenia (HCC)   . Sleeping difficulties    History reviewed. No pertinent surgical history. Family History:  Family History  Problem Relation Age of Onset  . Diabetes Maternal Aunt   . Mental illness Neg Hx    Family Psychiatric  History: see HPI Social History:  History  Alcohol Use  . Yes     History  Drug Use  . Types: "Crack" cocaine, Marijuana    Social History   Social History  . Marital status: Single    Spouse name: N/A  . Number of children: N/A  . Years of education: N/A   Social History Main Topics  . Smoking status: Current Every Day Smoker    Packs/day: 1.00    Years: 35.00    Types: Cigarettes  . Smokeless tobacco: Never Used  . Alcohol use Yes  . Drug use:     Types: "Crack" cocaine, Marijuana  . Sexual activity: Yes    Birth control/ protection: None   Other Topics Concern  . None   Social History Narrative  . None    Hospital Course:  Peterson LombardGerald Waltersis a 46  y.o.AA male, who has a hx of schizophrenia, alcohol, cocaine abuse, noncompliance with medication, who presented to ED ue to increased hallucinations and suicidal thoughts per EHR.  Steven Christensen Buhl was admitted for Paranoid schizophrenia [facility](HCC) and crisis management.  Patient was treated with medications with their indications listed below in detail under Medication List.  Medical problems were identified and treated as needed.  Home medications were restarted as appropriate.  Improvement was monitored by observation and Steven Christensen Witt daily report of symptom reduction.  Emotional and mental status was monitored by daily self inventory reports completed by Steven Christensen Parlow and clinical staff.  Patient reported continued improvement, denied any new concerns.  Patient had been compliant on medications and denied side effects.  Support and encouragement was provided.    Steven Christensen Minar[place] was evaluated by the treatment team for stability and plans for continued recovery upon discharge.  Patient was offered further treatment options upon discharge including Residential, Intensive Outpatient and Outpatient treatment. Patient will follow up with agency listed below for medication management and counseling.  Encouraged patient to maintain satisfactory support network and home environment.  Advised to adhere to medication compliance and outpatient treatment follow up.  Prescriptions provided.       Steven Christensen Bonham motivation was an integral factor for scheduling further treatment.  Employment, transportation, bed availability, health status, family support, and any pending legal issues were also considered during patient's  hospital stay.  Upon completion of this admission the patient was both mentally and medically stable for discharge denying suicidal/homicidal ideation, auditory/visual/tactile hallucinations, delusional thoughts and paranoia.      Physical Findings: AIMS: Facial and Oral Movements Muscles of Facial  Expression: None, normal Lips and Perioral Area: None, normal Jaw: None, normal Tongue: None, normal,Extremity Movements Upper (arms, wrists, hands, fingers): None, normal Lower (legs, knees, ankles, toes): None, normal, Trunk Movements Neck, shoulders, hips: None, normal, Overall Severity Severity of abnormal movements (highest score from questions above): None, normal Incapacitation due to abnormal movements: None, normal Patient's awareness of abnormal movements (rate only patient's report): No Awareness, Dental Status Current problems with teeth and/or dentures?: No Does patient usually wear dentures?: No  CIWA:  CIWA-Ar Total: 1 COWS:  COWS Total Score: 2  Musculoskeletal: Strength & Muscle Tone: within normal limits Gait & Station: normal Patient leans: N/A  Psychiatric Specialty Exam:  SEE MD SRA Physical Exam  Nursing note and vitals reviewed.   ROS  Blood pressure 122/79, pulse 79, temperature 98.8 F (37.1 C), temperature source Oral, resp. rate 18, height 6\' 4"  (1.93 m), weight 97.5 kg (215 lb).Body mass index is 26.17 kg/m.    Have you used any form of tobacco in the last 30 days? (Cigarettes, Smokeless Tobacco, Cigars, and/or Pipes): Yes  Has this patient used any form of tobacco in the last 30 days? (Cigarettes, Smokeless Tobacco, Cigars, and/or Pipes) Yes, N/A  Blood Alcohol level:  Lab Results  Component Value Date   ETH <5 04/06/2016   ETH 44 (H) 01/12/2016    Metabolic Disorder Labs:  Lab Results  Component Value Date   HGBA1C 11.2 (H) 04/09/2016   MPG 275 04/09/2016   Lab Results  Component Value Date   PROLACTIN 13.3 04/09/2016   PROLACTIN 74.0 (H) 06/03/2015   Lab Results  Component Value Date   CHOL 144 04/09/2016   TRIG 118 04/09/2016   HDL 39 (L) 04/09/2016   CHOLHDL 3.7 04/09/2016   VLDL 24 04/09/2016   LDLCALC 81 04/09/2016   LDLCALC 97 06/03/2015    See Psychiatric Specialty Exam and Suicide Risk Assessment completed by  Attending Physician prior to discharge.  Discharge destination:  Other:  [facility]  Is patient on multiple antipsychotic therapies at discharge:  No   Has Patient had three or more failed trials of antipsychotic monotherapy by history:  No  Recommended Plan for Multiple Antipsychotic Therapies: NA   Allergies as of 04/12/2016   No Known Allergies     Medication List    STOP taking these medications   ARIPiprazole 20 MG tablet Commonly known as:  ABILIFY Replaced by:  ARIPiprazole ER 400 MG Srer   ARIPiprazole ER 400 MG Susr   diltiazem 2 % Gel   glipiZIDE-metformin 5-500 MG tablet Commonly known as:  METAGLIP   oxyCODONE-acetaminophen 5-325 MG tablet Commonly known as:  PERCOCET/ROXICET   Vitamin D (Ergocalciferol) 50000 units Caps capsule Commonly known as:  DRISDOL     TAKE these medications     Indication  ARIPiprazole ER 400 MG Srer Inject 400 mg into the muscle every 30 (thirty) days. Next dose 2/4 Start taking on:  05/09/2016 Replaces:  ARIPiprazole 20 MG tablet  Indication:  mood stabilization   gabapentin 300 MG capsule Commonly known as:  NEURONTIN Take 1 capsule (300 mg total) by mouth 3 (three) times daily.  Indication:  Neuropathic Pain   insulin glargine 100 UNIT/ML injection Commonly known as:  LANTUS Inject  0.4 mLs (40 Units total) into the skin every morning.  Indication:  Type 2 Diabetes   metFORMIN 500 MG tablet Commonly known as:  GLUCOPHAGE Take 1 tablet (500 mg total) by mouth 2 (two) times daily with a meal.  Indication:  Type 2 Diabetes   nicotine 21 mg/24hr patch Commonly known as:  NICODERM CQ - dosed in mg/24 hours Place 1 patch (21 mg total) onto the skin daily. Start taking on:  04/13/2016  Indication:  Nicotine Addiction   traZODone 100 MG tablet Commonly known as:  DESYREL Take 1 tablet (100 mg total) by mouth at bedtime.  Indication:  Major Depressive Disorder      Follow-up Information    [facility] Follow  up on 04/13/2016.   Why:  Tuesday at 11:45 for your scrrening for admission appointment Contact information: [address] [place] [address] [phone]        [facility] Follow up.   Specialty:  Behavioral Health Why:  Call Reggie Brooke Dare at [phone] when you get out of [facility] so that he can get you to and from your appointments at [facility] information: [address] [place] [address] [phone]           Follow-up recommendations:  Activity:  as tol Diet:  as tol  Comments:  1.  Take all your medications as prescribed.   2.  Report any adverse side effects to outpatient provider. 3.  Patient instructed to not use alcohol or illegal drugs while on prescription medicines. 4.  In the event of worsening symptoms, instructed patient to call 911, the crisis hotline or go to nearest emergency room for evaluation of symptoms.  Signed: Lindwood Qua, NP [facility] 04/12/2016, 10:29 AM

## 2016-04-12 NOTE — Progress Notes (Signed)
D:  Patient's self inventory sheet, patient sleeps fair, sleep medication helpful.  Good appetite, normal energy level, good concentration.  Rated depression, hopeless and anxiety #4.  Withdrawals.  Denied SI.  Denied physical problems.  Pain, worst pain in past 24 hours is #8, no pain medication.  Goal is to get life together.  Plans to work harder.  No discharge plans. A:  Medications administered per MD orders.  Emotional support and encouragement given patient. R:   Denied SI and HI, contracts for safety.  Denied A/V hallucinations.  Safety maintained with 15 minute checks.

## 2016-04-12 NOTE — Progress Notes (Signed)
Recreation Therapy Notes  Date: 04/12/16 Time: 1000 Location: 500 [address] Dayroom  Group Topic: Coping Skills  Goal Area(s) Addresses:  Patients will be able to identify positive coping skills. Patients will be able to identify the benefits of coping skills. Patients will be able to identify how using coping skills will be helpful post d/c.  Behavioral Response: Engaged  Intervention: Dry erase marker, dry erase board, strips of paper with various coping skills   Activity: Coping Skills Pictionary.  Patients were to pick a strip of paper from a can.  Patients were to draw what was on the paper on the board.  The remaining patients were to guess what was being drawn.  The person that guesses the picture, would get the next turn.  Education: PharmacologistCoping Skills, Building control surveyorDischarge Planning.   Education Outcome: Acknowledges understanding/In group clarification offered/Needs additional education.   Clinical Observations/Feedback: Pt was engaged during group.  Pt interacted well with peers and gave a lot of effort during group.   Caroll RancherMarjette Myson Levi, LRT/CTRS         Caroll RancherLindsay, Isobelle Tuckett A 04/12/2016 12:37 PM

## 2016-04-12 NOTE — Progress Notes (Signed)
  [facility] :  Will you be returning to the same living situation after discharge:  Yes,  home At discharge, do you have transportation home?: Yes,  bus [pass Do you have the ability to pay for your medications: Yes,  MCD  Release of information consent forms completed and in the chart;  Patient's signature needed at discharge.  Patient to Follow up at: Follow-up Information    [facility] Follow up on 04/13/2016.   Why:  Tuesday at 11:45 for your scrrening for admission appointment Contact information: [address] [place] [address] [phone]        [facility].   Specialty:  Behavioral Health Why:  Call Reggie Brooke DareKing at [phone] when you get out of [facility] so that he can get you to and from [facility] Contact information: [address] [place] [address] [phone]           Next level of care provider has [facility] Link:no  Safety Planning and Suicide Prevention discussed: Yes,  yues  Have you used any form of tobacco in the last 30 days? (Cigarettes, Smokeless Tobacco, Cigars, and/or Pipes): Yes  Has patient been referred to the Quitline?: Patient refused referral  Patient has been referred for addiction treatment: Yes  Ida RogueRodney B Colandra Ohanian 04/12/2016, 9:23 AM

## 2016-04-12 NOTE — Progress Notes (Signed)
   D: Pt was sitting in the dayroom watching the [place] game along prior to the assessment. When asked about his day pt stated, "ok". Pt appeared to be apprehensive about conversating with Clinical research associatewriter. Writer waiting til after the game to see if it would make a difference. However, pt was still reluctant and apprehensive to discuss his day. Pt has no questions or concerns.    .A:  Support and encouragement was offered. 15 min checks continued for safety.  R: Pt remains safe.

## 2016-04-12 NOTE — Progress Notes (Signed)
Discharge Note:  Patient discharged home with mother.  Patient denied SI and HI, contracts for safety.  Denied A/V hallucinations.  Suicide prevention information given and discussed with patient who stated he understood and had no questions.  Patient stated he received all his belongings, clothing, toiletries, misc items, prescriptions, etc.  Patient stated he appreciated all assistance [facility].  All required discharge information given to patient at discharge.

## 2016-04-12 NOTE — [facility] Suicide Risk Assessment (Signed)
[facility]   Principal Problem: Paranoid schizophrenia [facility](HCC) Discharge Diagnoses:  Patient Active Problem List   Diagnosis Date Noted  . Paranoid schizophrenia (HCC) [F20.0] 04/08/2016  . Alcohol use disorder, moderate, dependence (HCC) [F10.20] 04/08/2016  . Cocaine use disorder, moderate, dependence (HCC) [F14.20] 04/08/2016  . Diabetes (HCC) [E11.9] 06/02/2015  . Tobacco use disorder [F17.200] 06/02/2015  . Hemorrhoids [K64.9] 08/02/2013    Total Time spent with patient: 30 minutes  Musculoskeletal: Strength & Muscle Tone: within normal limits Gait & Station: normal Patient leans: N/A  Psychiatric Specialty Exam: Review of Systems  Psychiatric/Behavioral: Positive for substance abuse. Negative for depression and suicidal ideas.  All other systems reviewed and are negative.   Blood pressure 122/79, pulse 79, temperature 98.8 F (37.1 C), temperature source Oral, resp. rate 18, height 6\' 4"  (1.93 m), weight 97.5 kg (215 lb).Body mass index is 26.17 kg/m.  General Appearance: Casual  Eye Contact::  Fair  Speech:  Clear and Coherent409  Volume:  Normal  Mood:  Euthymic  Affect:  Appropriate  Thought Process:  Goal Directed and Descriptions of Associations: Intact  Orientation:  Full (Time, Place, and Person)  Thought Content:  Logical  Suicidal Thoughts:  No  Homicidal Thoughts:  No  Memory:  Immediate;   Fair Recent;   Fair Remote;   Fair  Judgement:  Fair  Insight:  Fair  Psychomotor Activity:  Normal  Concentration:  Fair  Recall:  Fis[employer]Fair  Fund of Knowledge:Fair  Language: Fair  Akathisia:  No  Handed:  Right  AIMS (if indicated):     Assets:  Communication Skills Desire for Improvement  Sleep:  Number of Hours: 5  Cognition: WNL  ADL's:  Intact   Mental Status Per Nursing Assessment::   On Admission:  Suicidal ideation indicated by patient, Suicide plan, Self-harm thoughts  Demographic Factors:  Male  Loss  Factors: NA  Historical Factors: Impulsivity  Risk Reduction Factors:   Positive social support and Positive therapeutic relationship  Continued Clinical Symptoms:  Alcohol/Substance Abuse/Dependencies Previous Psychiatric Diagnoses and Treatments  Cognitive Features That Contribute To Risk:  None    Suicide Risk:  Minimal: No identifiable suicidal ideation.  Patients presenting with no risk factors but with morbid ruminations; may be classified as minimal risk based on the severity of the depressive symptoms  Follow-up Information    [facility] Follow up on 04/13/2016.   Why:  Tuesday at 11:45 for your scrrening for admission appointment Contact information: [address] [place] [address] [phone]        [facility].   Specialty:  Behavioral Health Why:  Call Reggie Brooke DareKing at [phone] when you get out of [facility] so that he can get you to and from [facility] Contact information: [address] [place] [address] [phone]           Plan Of Care/Follow-up recommendations:  Activity:  no restrictions Diet:  regular Other:  Abilify Maintena IM 400 mg - last dose 04/09/2016 - repeat q30 days  Shakaria Raphael, MD 04/12/2016, 9:22 AM

## 2016-04-12 NOTE — Tx Team (Signed)
Interdisciplinary Treatment and Diagnostic Plan Update  04/12/2016 Time of Session: 9:21 AM  Steven Christensen MRN: [id number]  Principal Diagnosis: Paranoid schizophrenia (HCC)  Secondary Diagnoses: Principal Problem:   Paranoid schizophrenia (HCC) Active Problems:   Diabetes (HCC)   Tobacco use disorder   Alcohol use disorder, moderate, dependence (HCC)   Cocaine use disorder, moderate, dependence (HCC)   Current Medications:  Current Facility-Administered Medications  Medication Dose Route Frequency Provider Last Rate Last Dose  . acetaminophen (TYLENOL) tablet 650 mg  650 mg Oral Q6H PRN Charm Rings, NP   650 mg at 04/12/16 0820  . alum & mag hydroxide-simeth (MAALOX/MYLANTA) 200-200-20 MG/5ML suspension 30 mL  30 mL Oral Q4H PRN Charm Rings, NP      . ARIPiprazole (ABILIFY) tablet 20 mg  20 mg Oral Daily Charm Rings, NP   20 mg at 04/12/16 0816  . ARIPiprazole ER SRER 400 mg  400 mg Intramuscular Q30 days Jomarie Longs, MD   400 mg at 04/09/16 1058  . gabapentin (NEURONTIN) capsule 300 mg  300 mg Oral TID Charm Rings, NP   300 mg at 04/12/16 0816  . hydrOXYzine (ATARAX/VISTARIL) tablet 25 mg  25 mg Oral Q4H PRN Jomarie Longs, MD   25 mg at 04/11/16 2115  . insulin aspart (novoLOG) injection 0-15 Units  0-15 Units Subcutaneous TID WC Jomarie Longs, MD   3 Units at 04/12/16 0618  . insulin aspart (novoLOG) injection 0-5 Units  0-5 Units Subcutaneous QHS Jomarie Longs, MD   2 Units at 04/08/16 2154  . insulin glargine (LANTUS) injection 40 Units  40 Units Subcutaneous Daily Charm Rings, NP   40 Units at 04/12/16 0511  . LORazepam (ATIVAN) tablet 1 mg  1 mg Oral Q6H PRN Saramma Eappen, MD      . magnesium hydroxide (MILK OF MAGNESIA) suspension 30 mL  30 mL Oral Daily PRN Charm Rings, NP      . metFORMIN (GLUCOPHAGE) tablet 500 mg  500 mg Oral BID WC Charm Rings, NP   500 mg at 04/12/16 0816  . nicotine (NICODERM CQ - dosed in mg/24 hours) patch 21 mg  21 mg  Transdermal Daily Charm Rings, NP   21 mg at 04/12/16 0630  . OLANZapine (ZYPREXA) tablet 10 mg  10 mg Oral TID PRN Jomarie Longs, MD   10 mg at 04/12/16 0622   Or  . OLANZapine (ZYPREXA) injection 10 mg  10 mg Intramuscular TID PRN Jomarie Longs, MD      . traZODone (DESYREL) tablet 100 mg  100 mg Oral QHS Charm Rings, NP   100 mg at 04/11/16 2115    PTA Medications: Prescriptions Prior to Admission  Medication Sig Dispense Refill Last Dose  . glipiZIDE-metformin (METAGLIP) 5-500 MG tablet      . oxyCODONE-acetaminophen (PERCOCET/ROXICET) 5-325 MG tablet      . ARIPiprazole (ABILIFY) 20 MG tablet Take 1 tablet (20 mg total) by mouth daily. (Patient not taking: Reported on 04/06/2016) 30 tablet 0 Not Taking at Unknown time  . ARIPiprazole 400 MG SUSR Inject 400 mg into the muscle every 28 (twenty-eight) days. (Patient not taking: Reported on 04/06/2016) 1 each 0 Not Taking at Unknown time  . diltiazem 2 % GEL Apply 1 application topically 2 (two) times daily. (Patient not taking: Reported on 04/06/2016) 30 g 1 Not Taking at Unknown time  . gabapentin (NEURONTIN) 300 MG capsule Take 1 capsule (300 mg total) by mouth  3 (three) times daily. (Patient not taking: Reported on 04/06/2016) 90 capsule 0 Not Taking at Unknown time  . insulin glargine (LANTUS) 100 UNIT/ML injection Inject 40 Units into the skin every morning.    Not Taking at Unknown time  . metFORMIN (GLUCOPHAGE) 500 MG tablet Take 1 tablet (500 mg total) by mouth 2 (two) times daily with a meal. (Patient not taking: Reported on 04/06/2016) 60 tablet 0 Not Taking at Unknown time  . traZODone (DESYREL) 100 MG tablet Take 1 tablet (100 mg total) by mouth at bedtime. (Patient not taking: Reported on 04/06/2016) 30 tablet 0 Not Taking at Unknown time  . Vitamin D, Ergocalciferol, (DRISDOL) 50000 units CAPS capsule Take 50,000 Units by mouth every 7 (seven) days.   Not Taking at Unknown time    Treatment Modalities: Medication Management, Group  therapy, Case management,  1 to 1 session with clinician, Psychoeducation, Recreational therapy.   Physician Treatment Plan for Primary Diagnosis: Paranoid schizophrenia ([place]) Long Term Goal(s): Improvement in symptoms so as ready for discharge  Short Term Goals:  Ability to identify triggers associated with substance abuse/mental health issues will improve  Medication Management: Evaluate patient's response, side effects, and tolerance of medication regimen.  Therapeutic Interventions: 1 to 1 sessions, Unit Group sessions and Medication administration.  Evaluation of Outcomes: Adequate for Discharge  Physician Treatment Plan for Secondary Diagnosis: Principal Problem:   Paranoid schizophrenia ([place]) Active Problems:   Diabetes ([place])   Tobacco use disorder   Alcohol use disorder, moderate, dependence (HCC)   Cocaine use disorder, moderate, dependence ([place])   Long Term Goal(s): Improvement in symptoms so as ready for discharge  Short Term Goals: Compliance with prescribed medications will improve   Medication Management: Evaluate patient's response, side effects, and tolerance of medication regimen.  Therapeutic Interventions: 1 to 1 sessions, Unit Group sessions and Medication administration.  Evaluation of Outcomes: Adequate for Discharge   RN Treatment Plan for Primary Diagnosis: Paranoid schizophrenia ([place]) Long Term Goal(s): Knowledge of disease and therapeutic regimen to maintain health will improve  Short Term Goals: Ability to identify and develop effective coping behaviors will improve and Compliance with prescribed medications will improve  Medication Management: RN will administer medications as ordered by provider, will assess and evaluate patient's response and provide education to patient for prescribed medication. RN will report any adverse and/or side effects to prescribing provider.  Therapeutic Interventions: 1 on 1 counseling sessions, Psychoeducation,  Medication administration, Evaluate responses to treatment, Monitor vital signs and CBGs as ordered, Perform/monitor CIWA, COWS, AIMS and Fall Risk screenings as ordered, Perform wound care treatments as ordered.  Evaluation of Outcomes: Adequate for Discharge   Recreational Therapy Treatment Plan for Primary Diagnosis: Paranoid schizophrenia ([place]) Long Term Goal(s):  LTG- Patient will participate in recreation therapy tx in at least 2 group sessions without prompting from LRT.  Short Term Goals: Patient will be able to identify at least 5 coping skills for admitting dx by conclusion of recreation therapy tx.  Treatment Modalities: Group and Pet Therapy  Therapeutic Interventions: Psychoeducation  Evaluation of Outcomes: Adequate for Discharge   LCSW Treatment Plan for Primary Diagnosis: Paranoid schizophrenia ([place]) Long Term Goal(s): Safe transition to appropriate next level of care at discharge, Engage patient in therapeutic group addressing interpersonal concerns.  Short Term Goals: Engage patient in aftercare planning with referrals and resources  Therapeutic Interventions: Assess for all discharge needs, 1 to 1 time with Social worker, Explore available resources and support systems, Assess for adequacy  in community support network, Educate family and significant other(s) on suicide prevention, Complete Psychosocial Assessment, Interpersonal group therapy.  Evaluation of Outcomes: Met   Return home for one night, follow up [place] in Treatment: Attending groups: Yes Participating in groups: Yes Taking medication as prescribed: Yes Toleration medication: Yes, no side effects reported at this time Family/Significant other contact made: Yes Patient understands diagnosis: Yes AEB sking for help to get back on meds Discussing patient identified problems/goals with staff: Yes Medical problems stabilized or resolved: Yes Denies suicidal/homicidal ideation:  Yes Issues/concerns per patient self-inventory: None Other: N/A  New problem(s) identified: None identified at this time.   New Short Term/Long Term Goal(s): None identified at this time.   Discharge Plan or Barriers: States he hopes to get into [facility] or [facility], but also states he needs to go home first-does not want to go directly from here  Reason for Continuation of Hospitalization:    Estimated Length of Stay:D/C today  Attendees: Patient: 04/12/2016  9:21 AM  Physician: Ursula Alert, MD 04/12/2016  9:21 AM  Nursing: Hoy Register, RN 04/12/2016  9:21 AM  RN Care Manager: Lars Pinks, RN 04/12/2016  9:21 AM  Social Worker: Ripley Fraise 04/12/2016  9:21 AM  Recreational Therapist: Laretta Bolster  04/12/2016  9:21 AM  Other: Norberto Sorenson 04/12/2016  9:21 AM  Other:  04/12/2016  9:21 AM    Scribe for Treatment Team:  Roque Lias LCSW 04/12/2016 9:21 AM

## 2018-01-03 ENCOUNTER — Inpatient Hospital Stay (HOSPITAL_COMMUNITY)
Admission: AD | Admit: 2018-01-03 | Discharge: 2018-01-07 | DRG: 897 | Disposition: A | Discharge status: Home / Self Care | Payer: Medicaid Other | Source: Intra-hospital | Attending: Psychiatry | Admitting: Psychiatry

## 2018-01-03 ENCOUNTER — Other Ambulatory Visit: Payer: Self-pay

## 2018-01-03 ENCOUNTER — Emergency Department (HOSPITAL_COMMUNITY): Payer: Medicaid Other

## 2018-01-03 ENCOUNTER — Emergency Department (HOSPITAL_COMMUNITY)
Admission: EM | Admit: 2018-01-03 | Discharge: 2018-01-03 | Disposition: A | Discharge status: Other Acute Inpatient Hospital | Payer: Medicaid Other | Attending: Emergency Medicine | Admitting: Emergency Medicine

## 2018-01-03 ENCOUNTER — Encounter (HOSPITAL_COMMUNITY): Payer: Self-pay | Admitting: Emergency Medicine

## 2018-01-03 ENCOUNTER — Encounter (HOSPITAL_COMMUNITY): Payer: Self-pay | Admitting: *Deleted

## 2018-01-03 DIAGNOSIS — F419 Anxiety disorder, unspecified: Secondary | ICD-10-CM | POA: Diagnosis present

## 2018-01-03 DIAGNOSIS — F1020 Alcohol dependence, uncomplicated: Secondary | ICD-10-CM | POA: Diagnosis present

## 2018-01-03 DIAGNOSIS — F1920 Other psychoactive substance dependence, uncomplicated: Secondary | ICD-10-CM | POA: Diagnosis present

## 2018-01-03 DIAGNOSIS — E119 Type 2 diabetes mellitus without complications: Secondary | ICD-10-CM | POA: Insufficient documentation

## 2018-01-03 DIAGNOSIS — G4700 Insomnia, unspecified: Secondary | ICD-10-CM | POA: Diagnosis present

## 2018-01-03 DIAGNOSIS — F1120 Opioid dependence, uncomplicated: Secondary | ICD-10-CM | POA: Diagnosis present

## 2018-01-03 DIAGNOSIS — I10 Essential (primary) hypertension: Secondary | ICD-10-CM | POA: Diagnosis present

## 2018-01-03 DIAGNOSIS — F200 Paranoid schizophrenia: Secondary | ICD-10-CM | POA: Diagnosis present

## 2018-01-03 DIAGNOSIS — R0789 Other chest pain: Secondary | ICD-10-CM | POA: Insufficient documentation

## 2018-01-03 DIAGNOSIS — Z9114 Patient's other noncompliance with medication regimen: Secondary | ICD-10-CM

## 2018-01-03 DIAGNOSIS — R45851 Suicidal ideations: Secondary | ICD-10-CM | POA: Diagnosis present

## 2018-01-03 DIAGNOSIS — F1994 Other psychoactive substance use, unspecified with psychoactive substance-induced mood disorder: Secondary | ICD-10-CM | POA: Diagnosis present

## 2018-01-03 DIAGNOSIS — Z794 Long term (current) use of insulin: Secondary | ICD-10-CM | POA: Insufficient documentation

## 2018-01-03 DIAGNOSIS — F1910 Other psychoactive substance abuse, uncomplicated: Secondary | ICD-10-CM | POA: Insufficient documentation

## 2018-01-03 DIAGNOSIS — F1210 Cannabis abuse, uncomplicated: Secondary | ICD-10-CM | POA: Diagnosis not present

## 2018-01-03 DIAGNOSIS — F17210 Nicotine dependence, cigarettes, uncomplicated: Secondary | ICD-10-CM | POA: Diagnosis present

## 2018-01-03 DIAGNOSIS — Z79899 Other long term (current) drug therapy: Secondary | ICD-10-CM | POA: Insufficient documentation

## 2018-01-03 DIAGNOSIS — Z915 Personal history of self-harm: Secondary | ICD-10-CM

## 2018-01-03 DIAGNOSIS — F209 Schizophrenia, unspecified: Secondary | ICD-10-CM | POA: Insufficient documentation

## 2018-01-03 DIAGNOSIS — F1410 Cocaine abuse, uncomplicated: Secondary | ICD-10-CM | POA: Diagnosis not present

## 2018-01-03 DIAGNOSIS — F329 Major depressive disorder, single episode, unspecified: Secondary | ICD-10-CM | POA: Diagnosis present

## 2018-01-03 LAB — CBC WITH DIFFERENTIAL/PLATELET
ABS IMMATURE GRANULOCYTES: 0 10*3/uL (ref 0.0–0.1)
Basophils Absolute: 0 10*3/uL (ref 0.0–0.1)
Basophils Relative: 1 %
EOS PCT: 0 %
Eosinophils Absolute: 0 10*3/uL (ref 0.0–0.7)
HEMATOCRIT: 47.2 % (ref 39.0–52.0)
HEMOGLOBIN: 15.3 g/dL (ref 13.0–17.0)
Immature Granulocytes: 0 %
LYMPHS ABS: 1.2 10*3/uL (ref 0.7–4.0)
LYMPHS PCT: 16 %
MCH: 30.9 pg (ref 26.0–34.0)
MCHC: 32.4 g/dL (ref 30.0–36.0)
MCV: 95.4 fL (ref 78.0–100.0)
MONO ABS: 0.3 10*3/uL (ref 0.1–1.0)
Monocytes Relative: 4 %
Neutro Abs: 5.9 10*3/uL (ref 1.7–7.7)
Neutrophils Relative %: 79 %
Platelets: 172 10*3/uL (ref 150–400)
RBC: 4.95 MIL/uL (ref 4.22–5.81)
RDW: 13.7 % (ref 11.5–15.5)
WBC: 7.5 10*3/uL (ref 4.0–10.5)

## 2018-01-03 LAB — RAPID URINE DRUG SCREEN, HOSP PERFORMED
Amphetamines: NOT DETECTED
BARBITURATES: NOT DETECTED
BENZODIAZEPINES: NOT DETECTED
Cocaine: POSITIVE — AB
Opiates: POSITIVE — AB
Tetrahydrocannabinol: NOT DETECTED

## 2018-01-03 LAB — TROPONIN I: Troponin I: <0.03 ng/mL (ref <0.03)

## 2018-01-03 LAB — CBG MONITORING, ED
GLUCOSE-CAPILLARY: 245 mg/dL — AB (ref 70–99)
Glucose-Capillary: 264 mg/dL — ABNORMAL HIGH (ref 70–99)

## 2018-01-03 LAB — COMPREHENSIVE METABOLIC PANEL
ALBUMIN: 3.6 g/dL (ref 3.5–5.0)
ALK PHOS: 77 U/L (ref 38–126)
ALT: 18 U/L (ref 0–44)
AST: 22 U/L (ref 15–41)
Anion gap: 8 (ref 5–15)
BILIRUBIN TOTAL: 0.9 mg/dL (ref 0.3–1.2)
BUN: 7 mg/dL (ref 6–20)
CALCIUM: 8.7 mg/dL — AB (ref 8.9–10.3)
CO2: 22 mmol/L (ref 22–32)
Chloride: 108 mmol/L (ref 98–111)
Creatinine, Ser: 0.94 mg/dL (ref 0.61–1.24)
GFR calc Af Amer: >60 mL/min (ref >60)
GFR calc non Af Amer: >60 mL/min (ref >60)
GLUCOSE: 291 mg/dL — AB (ref 70–99)
Potassium: 4.2 mmol/L (ref 3.5–5.1)
Sodium: 138 mmol/L (ref 135–145)
TOTAL PROTEIN: 6.5 g/dL (ref 6.5–8.1)

## 2018-01-03 LAB — BRAIN NATRIURETIC PEPTIDE: B Natriuretic Peptide: 39.1 pg/mL (ref 0.0–100.0)

## 2018-01-03 LAB — SALICYLATE LEVEL

## 2018-01-03 LAB — ACETAMINOPHEN LEVEL: Acetaminophen (Tylenol), Serum: <10 ug/mL — ABNORMAL LOW (ref 10–30)

## 2018-01-03 LAB — PROTIME-INR
INR: 1.08
Prothrombin Time: 13.9 seconds (ref 11.4–15.2)

## 2018-01-03 LAB — ETHANOL: Alcohol, Ethyl (B): <10 mg/dL (ref <10)

## 2018-01-03 LAB — MAGNESIUM: Magnesium: 1.7 mg/dL (ref 1.7–2.4)

## 2018-01-03 MED ORDER — INSULIN GLARGINE 100 UNIT/ML ~~LOC~~ SOLN: 40.0000 [IU] | SUBCUTANEOUS | Status: DC

## 2018-01-03 MED ORDER — HYDROXYZINE HCL 25 MG PO TABS
25.0000 mg | ORAL_TABLET | Freq: Four times a day (QID) | ORAL | Status: DC | PRN
  Administered 2018-01-04: 25 mg via ORAL
  Filled 2018-01-03: qty 1

## 2018-01-03 MED ORDER — NICOTINE 21 MG/24HR TD PT24
21.0000 mg | MEDICATED_PATCH | Freq: Every day | TRANSDERMAL | Status: DC
  Administered 2018-01-03 – 2018-01-07 (×5): 21 mg via TRANSDERMAL
  Filled 2018-01-03 (×7): qty 1

## 2018-01-03 MED ORDER — ACETAMINOPHEN 325 MG PO TABS
650.0000 mg | ORAL_TABLET | Freq: Four times a day (QID) | ORAL | Status: DC | PRN
  Administered 2018-01-06 (×2): 650 mg via ORAL
  Filled 2018-01-03 (×2): qty 2

## 2018-01-03 MED ORDER — ARIPIPRAZOLE ER 400 MG IM SRER: 400.0000 mg | INTRAMUSCULAR | Status: DC

## 2018-01-03 MED ORDER — ASPIRIN 81 MG PO CHEW: 324.0000 mg | CHEWABLE_TABLET | Freq: Once | ORAL | Status: DC

## 2018-01-03 MED ORDER — NICOTINE 21 MG/24HR TD PT24
21.0000 mg | MEDICATED_PATCH | Freq: Every day | TRANSDERMAL | Status: DC
  Filled 2018-01-03: qty 1

## 2018-01-03 MED ORDER — METFORMIN HCL 500 MG PO TABS: 500.0000 mg | ORAL_TABLET | Freq: Two times a day (BID) | ORAL | Status: DC

## 2018-01-03 MED ORDER — LORAZEPAM 2 MG/ML IJ SOLN
1.0000 mg | Freq: Once | INTRAMUSCULAR | Status: AC
  Administered 2018-01-03: 1 mg via INTRAVENOUS
  Filled 2018-01-03: qty 1

## 2018-01-03 MED ORDER — GABAPENTIN 300 MG PO CAPS
300.0000 mg | ORAL_CAPSULE | Freq: Three times a day (TID) | ORAL | Status: DC
  Administered 2018-01-03: 300 mg via ORAL
  Filled 2018-01-03: qty 1

## 2018-01-03 MED ORDER — TRAZODONE HCL 50 MG PO TABS
50.0000 mg | ORAL_TABLET | Freq: Every evening | ORAL | Status: DC | PRN
  Administered 2018-01-04 – 2018-01-06 (×3): 50 mg via ORAL
  Filled 2018-01-03 (×3): qty 1

## 2018-01-03 MED ORDER — ARIPIPRAZOLE 10 MG PO TABS
20.0000 mg | ORAL_TABLET | Freq: Every day | ORAL | Status: DC
  Administered 2018-01-03: 20 mg via ORAL
  Filled 2018-01-03: qty 2

## 2018-01-03 MED ORDER — ALUM & MAG HYDROXIDE-SIMETH 200-200-20 MG/5ML PO SUSP
15.0000 mL | Freq: Four times a day (QID) | ORAL | Status: DC | PRN
  Administered 2018-01-06: 15 mL via ORAL
  Filled 2018-01-03: qty 30

## 2018-01-03 MED ORDER — INSULIN ASPART 100 UNIT/ML ~~LOC~~ SOLN: 0.0000 [IU] | Freq: Three times a day (TID) | SUBCUTANEOUS | Status: DC

## 2018-01-03 MED ORDER — TRAZODONE HCL 100 MG PO TABS: 100.0000 mg | ORAL_TABLET | Freq: Every day | ORAL | Status: DC

## 2018-01-03 NOTE — ED Notes (Signed)
Pt wanded by security. Pt's belongings placed in locker. No items with security or pharmacy.

## 2018-01-03 NOTE — ED Notes (Addendum)
Pt became upset after his mother visited. Pt called his friend and asked him to come pick him up. Advised pt of tx plan - accepted to [facility]. Pt initially stating he wanted to leave to go get his bank card then return. RN encouraged pt to vent feelings/concerns. States he is threatened daily which is another stressor for him. Pt noted to tearful. Pt then stated he wants to go to [facility] and get back on his meds to help keep him calm. Pt signed consent form - copy faxed to [facility], copy sent to Medical Records, and original placed in envelope for [facility].

## 2018-01-03 NOTE — ED Notes (Addendum)
[facility] Chaplain Toni in w/pt. Pt signed consent form to release info for her to have information. Copy faxed to [facility], copy sent to Medical Records, and original placed in envelope for [facility]. Pt given [place] sandwich and Sprite [employer] for snack as requested.
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
## 2018-01-03 NOTE — Progress Notes (Signed)
Steven Christensen is a 47 year old male pt admitted on voluntary basis. On admission, he reports that he has been feeling depressed and suicidal recently and does endorse passive SI on admission but able to contract for safety while in the hospital. He reports that he did to go Steven Christensen in the past but reports that it's been about a year since he's gone and reports that he has not taken any medications in that time. He does endorse substance abuse and endorses that he uses alcohol, heroin, crack and marijuana. He reports that he would like to get back on medications while he is here. He reports that he was living with his mother but reports that he would like to get into a long-term treatment program after discharge. Steven Christensen was escorted to the unit, oriented to the milieu and safety maintained.

## 2018-01-03 NOTE — ED Notes (Signed)
STEMI canceled. 

## 2018-01-03 NOTE — ED Notes (Signed)
Took out patient 2 saline lock patient is resting with sitter at bedside

## 2018-01-03 NOTE — ED Provider Notes (Signed)
[facility] EMERGENCY DEPARTMENT Provider Note   CSN: [id number] Arrival date & time: 01/03/18  0854     History   Chief Complaint Chief Complaint  Patient presents with  . Chest Pain  . Suicidal  . Addiction Problem    HPI Steven Christensen is a 47 y.o. male.  HPI Presented as a code STEMI via EMS patient himself is complaining of chest tightness, leg tightness. He notes that he is cocaine, heroin over the past 7 hours, developed chest pain thereafter, which has been persistent since that time. Pain is tight, anterior thoracic, with associated generalized discomfort, sweatiness. Patient denies chest pain prior to this. EMS notes the patient was normotensive, though uncomfortable appearing on arrival, received aspirin in route, without appreciable change in his pain.  On subsequently received nitroglycerin as well, which seems to have improved his pain somewhat.  Past Medical History:  Diagnosis Date  . Anxiety   . Bipolar affective ([place])   . Depression   . Diabetes mellitus   . Schizophrenia ([place])   . Sleeping difficulties     Patient Active Problem List   Diagnosis Date Noted  . Paranoid schizophrenia ([place]) 04/08/2016  . Alcohol use disorder, moderate, dependence ([place]) 04/08/2016  . Cocaine use disorder, moderate, dependence ([place]) 04/08/2016  . Diabetes ([place]) 06/02/2015  . Tobacco use disorder 06/02/2015  . Hemorrhoids 08/02/2013    History reviewed. No pertinent surgical history.      Home Medications    Prior to Admission medications   Medication Sig Start Date End Date Taking? Authorizing Provider  ARIPiprazole (ABILIFY) 20 MG tablet Take 1 tablet (20 mg total) by mouth daily. 04/13/16   Kerrie Buffalo, NP  ARIPiprazole ER 400 MG SRER Inject 400 mg into the muscle every 30 (thirty) days. Next dose 2/4 05/09/16   Kerrie Buffalo, NP  gabapentin (NEURONTIN) 300 MG capsule Take 1 capsule (300 mg total) by mouth 3 (three) times daily. 04/12/16    Kerrie Buffalo, NP  insulin glargine (LANTUS) 100 UNIT/ML injection Inject 0.4 mLs (40 Units total) into the skin every morning. 04/12/16   Kerrie Buffalo, NP  metFORMIN (GLUCOPHAGE) 500 MG tablet Take 1 tablet (500 mg total) by mouth 2 (two) times daily with a meal. 04/12/16   Kerrie Buffalo, NP  nicotine (NICODERM CQ - DOSED IN MG/24 HOURS) 21 mg/24hr patch Place 1 patch (21 mg total) onto the skin daily. 04/13/16   Kerrie Buffalo, NP  traZODone (DESYREL) 100 MG tablet Take 1 tablet (100 mg total) by mouth at bedtime. 04/12/16   Kerrie Buffalo, NP    Family History Family History  Problem Relation Age of Onset  . Diabetes Maternal Aunt   . Mental illness Neg Hx     Social History Social History   Tobacco Use  . Smoking status: Current Every Day Smoker    Packs/day: 1.00    Years: 35.00    Pack years: 35.00    Types: Cigarettes  . Smokeless tobacco: Never Used  Substance Use Topics  . Alcohol use: Yes  . Drug use: Yes    Types: "Crack" cocaine, Marijuana    Comment: heroine, cocaine     Allergies   Patient has no known allergies.   Review of Systems Review of Systems  Unable to perform ROS: Acuity of condition     Physical Exam Updated Vital Signs BP 140/83   Pulse 62   Temp 98.2 F (36.8 C) (Oral)   Resp 17   SpO2  99%   Physical Exam  Constitutional: He is oriented to person, place, and time. He appears well-developed. No distress.  Uncomfortable appearing diaphoretic male awake and alert describing pain  HENT:  Head: Normocephalic and atraumatic.  Eyes: Conjunctivae and EOM are normal.  Cardiovascular: Normal rate and regular rhythm.  Pulmonary/Chest: Effort normal. No stridor. No respiratory distress.  Abdominal: He exhibits no distension.  Musculoskeletal: He exhibits no edema.  Neurological: He is alert and oriented to person, place, and time. No cranial nerve deficit.  Skin: Skin is warm. He is diaphoretic.  Psychiatric: His mood appears anxious.    Nursing note and vitals reviewed.    ED Treatments / Results  Labs (all labs ordered are listed, but only abnormal results are displayed) Labs Reviewed  COMPREHENSIVE METABOLIC PANEL - Abnormal; Notable for the following components:      Result Value   Glucose, Bld 291 (*)    Calcium 8.7 (*)    All other components within normal limits  ACETAMINOPHEN LEVEL - Abnormal; Notable for the following components:   Acetaminophen (Tylenol), Serum <10 (*)    All other components within normal limits  CBG MONITORING, ED - Abnormal; Notable for the following components:   Glucose-Capillary 264 (*)    All other components within normal limits  MAGNESIUM  TROPONIN I  BRAIN NATRIURETIC PEPTIDE  CBC WITH DIFFERENTIAL/PLATELET  PROTIME-INR  ETHANOL  SALICYLATE LEVEL  RAPID URINE DRUG SCREEN, HOSP PERFORMED    EKG EKG Interpretation  Date/Time:  Tuesday January 03 2018 08:56:46 EDT Ventricular Rate:  87 PR Interval:    QRS Duration: 92 QT Interval:  362 QTC Calculation: 436 R Axis:   71 Text Interpretation:  Sinus rhythm Anterior infarct, old ST-t wave abnormality Abnormal ekg Confirmed by Carmin Muskrat [phone]) on 01/03/2018 9:09:33 AM   Radiology Dg Chest Portable 1 View  Result Date: 01/03/2018 CLINICAL DATA:  CP since 0200 today, after use of cocaine and heroin. Diaphoretic, in and out of consciousness. Hx of diabetes, hypertension. Smoker x35 years. EXAM: PORTABLE CHEST - 1 VIEW COMPARISON:  10/06/2008 FINDINGS: Lungs are clear. Resolution of left lower lung infiltrate seen previously. Heart size upper limits normal for technique. Aortic Atherosclerosis (ICD10-170.0). No effusion.  No pneumothorax. Visualized bones unremarkable. IMPRESSION: No acute cardiopulmonary disease. Electronically Signed   By: Lucrezia Europe M.D.   On: 01/03/2018 09:24    Procedures Procedures (including critical care time)  Medications Ordered in ED Medications  LORazepam (ATIVAN) injection 1 mg (1 mg  Intravenous Given 01/03/18 8127)   Patient met on arrival by myself, staff, and subsequently discussed with our cardiology colleagues. Patient's EKG compared to prior, with similar morphology, with change in R waves.  Patient's designation as a code STEMI canceled, but given his ongoing chest pain, use of cocaine, he will require continued monitoring, management.    Initial Impression / Assessment and Plan / ED Course  I have reviewed the triage vital signs and the nursing notes.  Pertinent labs & imaging results that were available during my care of the patient were reviewed by me and considered in my medical decision making (see chart for details).     Pain remains better on repeat exam  11:59 AM She awake and alert, no ongoing pain, no complaints. He has been seen and evaluated by myself, our cardiology colleagues, and behavioral health given his acknowledgment of suicidal ideation after initial presentation for chest pain. Patient's evaluation here generally reassuring, no evidence for ongoing coronary ischemia, no  evidence for pneumonia, or other acute thoracic pathology. Patient has been medically cleared for psychiatric evaluation, and the recommendation is for inpatient treatment for his depression, suicidal ideation.   Final Clinical Impressions(s) / ED Diagnoses   Final diagnoses:  Suicidal ideation  Atypical chest pain  Polysubstance abuse (HCC)     Carmin Muskrat, MD 01/03/18 1159

## 2018-01-03 NOTE — ED Notes (Signed)
Pt arrived to F7 - via w/c - wearing burgundy scrubs. Sitter w/pt. Pt eating lunch.

## 2018-01-03 NOTE — ED Notes (Signed)
Urine specimen cup given - voiced understanding of need for urine specimen.

## 2018-01-03 NOTE — ED Notes (Signed)
STEMI team at bedside

## 2018-01-03 NOTE — ED Notes (Signed)
Pt ambulated in hallway briefly then returned to room. Lying on bed, alert.

## 2018-01-03 NOTE — Progress Notes (Signed)
   01/03/18 0900  Clinical Encounter Type  Visited With Patient  Visit Type ED  Referral From Nurse  Spiritual Encounters  Spiritual Needs Emotional;Prayer  Responded to Pager for Stemi. Stemi was cancelled however,per nurse informed me that he might need some support. Went in and spoke with patient. He expresses wanting to die. He does not want me to contact any family. I prayed with him and provided emotional and spiritual support. Will follow-up.

## 2018-01-03 NOTE — ED Notes (Signed)
Ordered lunch tray for pt  

## 2018-01-03 NOTE — ED Notes (Signed)
Pt placed on zoll monitor

## 2018-01-03 NOTE — Tx Team (Signed)
Initial Treatment Plan 01/03/2018 6:32 PM [name] [id number]    PATIENT STRESSORS: Health problems Medication change or noncompliance Substance abuse   PATIENT STRENGTHS: Ability for insight Average or above average intelligence Capable of independent living General fund of knowledge Motivation for treatment/growth   PATIENT IDENTIFIED PROBLEMS: Depression Suicidal thoughts Substance Abuse "Try to get off drugs and get myself back together"                     DISCHARGE CRITERIA:  Ability to meet basic life and health needs Improved stabilization in mood, thinking, and/or behavior Reduction of life-threatening or endangering symptoms to within safe limits Verbal commitment to aftercare and medication compliance  PRELIMINARY DISCHARGE PLAN: Attend aftercare/continuing care group  PATIENT/FAMILY INVOLVEMENT: This treatment plan has been presented to and reviewed with the patient, Phill Steck, and/or family member, .  The patient and family have been given the opportunity to ask questions and make suggestions.  Tniyah Nakagawa, [place], [place] 01/03/2018, 6:32 PM

## 2018-01-03 NOTE — ED Triage Notes (Signed)
Pt admits to cocaine and heroine use at 2am this morning. EMS called for CP. Pt diaphoretic, in and out of consciousness. EMS gave 324mg  ASA, 4mg  morphine, 3 nitro. STEMI called in the field. Pt alert and oriented when aroused.

## 2018-01-03 NOTE — ED Notes (Signed)
Pt's mother visited. Pt upset d/t he was trying to get her to give him his bank card "because it has money on it and she will spend it".

## 2018-01-03 NOTE — ED Notes (Signed)
ALL belongings - 1 labeled belongings bag - No Valuables noted - [place] - Pt aware.

## 2018-01-03 NOTE — Progress Notes (Signed)
Per Dr. Kumar patient meets in patient criteria. TTS to seek placement. 

## 2018-01-04 DIAGNOSIS — F17210 Nicotine dependence, cigarettes, uncomplicated: Secondary | ICD-10-CM

## 2018-01-04 DIAGNOSIS — F1920 Other psychoactive substance dependence, uncomplicated: Principal | ICD-10-CM

## 2018-01-04 DIAGNOSIS — F1120 Opioid dependence, uncomplicated: Secondary | ICD-10-CM

## 2018-01-04 DIAGNOSIS — F1994 Other psychoactive substance use, unspecified with psychoactive substance-induced mood disorder: Secondary | ICD-10-CM | POA: Diagnosis present

## 2018-01-04 LAB — GLUCOSE, CAPILLARY
GLUCOSE-CAPILLARY: 216 mg/dL — AB (ref 70–99)
GLUCOSE-CAPILLARY: 146 mg/dL — AB (ref 70–99)
Glucose-Capillary: 154 mg/dL — ABNORMAL HIGH (ref 70–99)

## 2018-01-04 MED ORDER — INSULIN ASPART 100 UNIT/ML ~~LOC~~ SOLN
0.0000 [IU] | Freq: Three times a day (TID) | SUBCUTANEOUS | Status: DC
  Administered 2018-01-04: 5 [IU] via SUBCUTANEOUS
  Administered 2018-01-04: 3 [IU] via SUBCUTANEOUS
  Administered 2018-01-05: 2 [IU] via SUBCUTANEOUS
  Administered 2018-01-05 – 2018-01-06 (×3): 3 [IU] via SUBCUTANEOUS
  Administered 2018-01-06: 2 [IU] via SUBCUTANEOUS
  Administered 2018-01-07: 0 [IU] via SUBCUTANEOUS
  Administered 2018-01-07: 3 [IU] via SUBCUTANEOUS

## 2018-01-04 MED ORDER — TRAZODONE HCL 100 MG PO TABS
100.0000 mg | ORAL_TABLET | Freq: Every day | ORAL | Status: DC
  Filled 2018-01-04 (×2): qty 1

## 2018-01-04 MED ORDER — ONDANSETRON 4 MG PO TBDP: 4.0000 mg | ORAL_TABLET | Freq: Four times a day (QID) | ORAL | Status: DC | PRN

## 2018-01-04 MED ORDER — ARIPIPRAZOLE 5 MG PO TABS
5.0000 mg | ORAL_TABLET | Freq: Every day | ORAL | Status: DC
  Administered 2018-01-04 – 2018-01-07 (×4): 5 mg via ORAL
  Filled 2018-01-04 (×9): qty 1

## 2018-01-04 MED ORDER — GABAPENTIN 300 MG PO CAPS
300.0000 mg | ORAL_CAPSULE | Freq: Three times a day (TID) | ORAL | Status: DC
  Administered 2018-01-04 (×2): 300 mg via ORAL
  Filled 2018-01-04 (×7): qty 1

## 2018-01-04 MED ORDER — GABAPENTIN 100 MG PO CAPS
100.0000 mg | ORAL_CAPSULE | Freq: Three times a day (TID) | ORAL | Status: DC
  Administered 2018-01-05 – 2018-01-07 (×8): 100 mg via ORAL
  Filled 2018-01-04 (×14): qty 1

## 2018-01-04 MED ORDER — VITAMIN B-1 100 MG PO TABS
100.0000 mg | ORAL_TABLET | Freq: Every day | ORAL | Status: DC
  Administered 2018-01-05 – 2018-01-07 (×3): 100 mg via ORAL
  Filled 2018-01-04 (×6): qty 1

## 2018-01-04 MED ORDER — INSULIN ASPART 100 UNIT/ML ~~LOC~~ SOLN
4.0000 [IU] | Freq: Three times a day (TID) | SUBCUTANEOUS | Status: DC
  Administered 2018-01-04 – 2018-01-07 (×10): 4 [IU] via SUBCUTANEOUS

## 2018-01-04 MED ORDER — HYDROXYZINE HCL 25 MG PO TABS
25.0000 mg | ORAL_TABLET | Freq: Four times a day (QID) | ORAL | Status: DC | PRN
  Administered 2018-01-04 – 2018-01-06 (×3): 25 mg via ORAL
  Filled 2018-01-04 (×3): qty 1

## 2018-01-04 MED ORDER — ADULT MULTIVITAMIN W/MINERALS CH
1.0000 | ORAL_TABLET | Freq: Every day | ORAL | Status: DC
  Administered 2018-01-04 – 2018-01-07 (×4): 1 via ORAL
  Filled 2018-01-04 (×7): qty 1

## 2018-01-04 MED ORDER — METFORMIN HCL 500 MG PO TABS
500.0000 mg | ORAL_TABLET | Freq: Two times a day (BID) | ORAL | Status: DC
  Administered 2018-01-04 – 2018-01-07 (×6): 500 mg via ORAL
  Filled 2018-01-04 (×10): qty 1

## 2018-01-04 MED ORDER — CHLORDIAZEPOXIDE HCL 25 MG PO CAPS: 25.0000 mg | ORAL_CAPSULE | Freq: Four times a day (QID) | ORAL | Status: DC | PRN

## 2018-01-04 MED ORDER — INSULIN GLARGINE 100 UNIT/ML ~~LOC~~ SOLN
40.0000 [IU] | SUBCUTANEOUS | Status: DC
  Administered 2018-01-04 – 2018-01-07 (×4): 40 [IU] via SUBCUTANEOUS

## 2018-01-04 MED ORDER — LOPERAMIDE HCL 2 MG PO CAPS
2.0000 mg | ORAL_CAPSULE | ORAL | Status: DC | PRN
  Administered 2018-01-06 (×2): 2 mg via ORAL
  Filled 2018-01-04: qty 1
  Filled 2018-01-04: qty 2

## 2018-01-04 NOTE — [facility] Group Notes (Signed)
[facility] Group Notes:  (Nursing/MHT/Case Management/Adjunct)  Date:  01/04/2018  Time:  4:00 pm  Type of Therapy:  Psychoeducational Skills  Participation Level:  Active  Participation Quality:  Appropriate  Affect:  Appropriate  Cognitive:  Appropriate  Insight:  Appropriate  Engagement in Group:  Engaged  Modes of Intervention:  Education  Summary of Progress/Problems: Patient attended group and participated appropriately.   Earline Mayotte 01/04/2018, 6:22 PM
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
## 2018-01-04 NOTE — Tx Team (Signed)
Interdisciplinary Treatment and Diagnostic Plan Update  01/04/2018 Time of Session: Perrin Gens MRN: [id number]  Principal Diagnosis: <principal problem not specified>  Secondary Diagnoses: Active Problems:   MDD (major depressive disorder)   Current Medications:  Current Facility-Administered Medications  Medication Dose Route Frequency Provider Last Rate Last Dose  . acetaminophen (TYLENOL) tablet 650 mg  650 mg Oral Q6H PRN Cobos, Myer Peer, MD      . alum & mag hydroxide-simeth (MAALOX/MYLANTA) 200-200-20 MG/5ML suspension 15 mL  15 mL Oral Q6H PRN Cobos, Myer Peer, MD      . hydrOXYzine (ATARAX/VISTARIL) tablet 25 mg  25 mg Oral Q6H PRN Cobos, Fernando A, MD      . nicotine (NICODERM CQ - dosed in mg/24 hours) patch 21 mg  21 mg Transdermal Daily Cobos, Myer Peer, MD   21 mg at 01/04/18 [id number]  . traZODone (DESYREL) tablet 50 mg  50 mg Oral QHS PRN Cobos, Myer Peer, MD       PTA Medications: Medications Prior to Admission  Medication Sig Dispense Refill Last Dose  . ARIPiprazole (ABILIFY) 20 MG tablet Take 1 tablet (20 mg total) by mouth daily. (Patient not taking: Reported on 01/03/2018) 14 tablet 0 Not Taking at Unknown time  . ARIPiprazole ER 400 MG SRER Inject 400 mg into the muscle every 30 (thirty) days. Next dose 2/4 (Patient not taking: Reported on 01/03/2018) 1 each 0 Not Taking at Unknown time  . gabapentin (NEURONTIN) 300 MG capsule Take 1 capsule (300 mg total) by mouth 3 (three) times daily. (Patient not taking: Reported on 01/03/2018) 90 capsule 0 Not Taking at Unknown time  . insulin glargine (LANTUS) 100 UNIT/ML injection Inject 0.4 mLs (40 Units total) into the skin every morning. (Patient not taking: Reported on 01/03/2018) 10 mL 11 Not Taking at Unknown time  . metFORMIN (GLUCOPHAGE) 500 MG tablet Take 1 tablet (500 mg total) by mouth 2 (two) times daily with a meal. (Patient not taking: Reported on 01/03/2018) 60 tablet 0 Not Taking at Unknown time  . nicotine  (NICODERM CQ - DOSED IN MG/24 HOURS) 21 mg/24hr patch Place 1 patch (21 mg total) onto the skin daily. (Patient not taking: Reported on 01/03/2018) 28 patch 0 Not Taking at Unknown time  . traZODone (DESYREL) 100 MG tablet Take 1 tablet (100 mg total) by mouth at bedtime. (Patient not taking: Reported on 01/03/2018) 30 tablet 0 Not Taking at Unknown time    Patient Stressors: Health problems Medication change or noncompliance Substance abuse  Patient Strengths: Ability for insight Average or above average intelligence Capable of independent living General fund of knowledge Motivation for treatment/growth  Treatment Modalities: Medication Management, Group therapy, Case management,  1 to 1 session with clinician, Psychoeducation, Recreational therapy.   Physician Treatment Plan for Primary Diagnosis: <principal problem not specified> Long Term Goal(s):     Short Term Goals:    Medication Management: Evaluate patient's response, side effects, and tolerance of medication regimen.  Therapeutic Interventions: 1 to 1 sessions, Unit Group sessions and Medication administration.  Evaluation of Outcomes: Not Met  Physician Treatment Plan for Secondary Diagnosis: Active Problems:   MDD (major depressive disorder)  Long Term Goal(s):     Short Term Goals:       Medication Management: Evaluate patient's response, side effects, and tolerance of medication regimen.  Therapeutic Interventions: 1 to 1 sessions, Unit Group sessions and Medication administration.  Evaluation of Outcomes: Not Met   RN Treatment Plan for Primary Diagnosis: <  principal problem not specified> Long Term Goal(s): Knowledge of disease and therapeutic regimen to maintain health will improve  Short Term Goals: Ability to participate in decision making will improve, Ability to disclose and discuss suicidal ideas, Ability to identify and develop effective coping behaviors will improve and Compliance with prescribed  medications will improve  Medication Management: RN will administer medications as ordered by provider, will assess and evaluate patient's response and provide education to patient for prescribed medication. RN will report any adverse and/or side effects to prescribing provider.  Therapeutic Interventions: 1 on 1 counseling sessions, Psychoeducation, Medication administration, Evaluate responses to treatment, Monitor vital signs and CBGs as ordered, Perform/monitor CIWA, COWS, AIMS and Fall Risk screenings as ordered, Perform wound care treatments as ordered.  Evaluation of Outcomes: Not Met   LCSW Treatment Plan for Primary Diagnosis: <principal problem not specified> Long Term Goal(s): Safe transition to appropriate next level of care at discharge, Engage patient in therapeutic group addressing interpersonal concerns.  Short Term Goals: Engage patient in aftercare planning with referrals and resources  Therapeutic Interventions: Assess for all discharge needs, 1 to 1 time with Social worker, Explore available resources and support systems, Assess for adequacy in community support network, Educate family and significant other(s) on suicide prevention, Complete Psychosocial Assessment, Interpersonal group therapy.  Evaluation of Outcomes: Not Met   Progress in Treatment: Attending groups: Yes. Participating in groups: Yes. Taking medication as prescribed: Yes. Toleration medication: Yes. Family/Significant other contact made: No, will contact:  no one, patient refuses consent for collateral contacts Patient understands diagnosis: Yes. Discussing patient identified problems/goals with staff: Yes. Medical problems stabilized or resolved: Yes. Denies suicidal/homicidal ideation: Yes. Issues/concerns per patient self-inventory: No. Other:   New problem(s) identified: None  New Short Term/Long Term Goal(s):medication stabilization, elimination of SI thoughts, development of comprehensive  mental wellness plan.    Patient Goals:  Try to get off drugs and get myself back together  Discharge Plan or Barriers: CSW will assess for appropriate referrals and discharge planning.   Reason for Continuation of Hospitalization: Depression Medication stabilization Suicidal ideation  Estimated Length of Stay: 3-5 days   Attendees: Patient: 01/04/2018 8:57 AM  Physician: Dr. Neita Garnet, MD 01/04/2018 8:57 AM  Nursing: Rise Paganini.Raliegh Ip, RN 01/04/2018 8:57 AM  RN Care Manager: Jackelyn Knife, RN 01/04/2018 8:57 AM  Social Worker: Radonna Ricker, LCSWA 01/04/2018 8:57 AM  Recreational Therapist: Rhunette Croft 01/04/2018 8:57 AM  Other: Marvia Pickles, NP 01/04/2018 8:57 AM  Other: Agustina Caroli, NP 01/04/2018 8:57 AM  Other: 01/04/2018 8:57 AM    Scribe for Treatment Team: Marylee Floras, [place] 01/04/2018 8:57 AM

## 2018-01-04 NOTE — Progress Notes (Signed)
Recreation Therapy Notes  Date: 10.2.19 Time: 0930 Location: [address] Dayroom  Group Topic: Stress Management  Goal Area(s) Addresses:  Patient will verbalize importance of using healthy stress management.  Patient will identify positive emotions associated with healthy stress management.   Intervention: Stress Management  Activity :  Guided Imagery.  LRT introduced the stress management technique of guided imagery.  LRT read a script taking the patients on a journey on the beach at sunrise.  Patients were to listen and follow along as script was read.  Education:  Stress Management, Discharge Planning.   Education Outcome: Acknowledges edcuation/In group clarification offered/Needs additional education  Clinical Observations/Feedback: Pt did not attend group.    Bunnie Lederman, LRT/CTRS         Norene Oliveri A 01/04/2018 11:27 AM 
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
## 2018-01-04 NOTE — H&P (Addendum)
Psychiatric Admission Assessment Adult  Patient Identification: Steven Christensen  MRN:  [id number]  Date of Evaluation:  01/04/2018  Chief Complaint: Chest tightness, suicidal ideations & worsening depression.  Principal Diagnosis: Polysubstance (including opioids) dependence, daily use ([place])  Diagnosis:   Patient Active Problem List   Diagnosis Date Noted  . Substance induced mood disorder ([place]) [F19.94] 01/04/2018    Priority: High  . Polysubstance (including opioids) dependence, daily use ([place]) [F11.20, F19.20] 01/04/2018    Priority: High  . MDD (major depressive disorder) [F32.9] 01/03/2018  . Paranoid schizophrenia ([place]) [F20.0] 04/08/2016  . Alcohol use disorder, moderate, dependence ([place]) [F10.20] 04/08/2016  . Cocaine use disorder, moderate, dependence ([place]) [F14.20] 04/08/2016  . Diabetes ([place]) [E11.9] 06/02/2015  . Tobacco use disorder [F17.200] 06/02/2015  . Hemorrhoids [K64.9] 08/02/2013   History of Present Illness: This is an admission assessment for Steven Christensen, a 47 year old AA male with hx of schizophrenia, alcohol, opioid & cocaine use disorders. He is also noncompliance with his medication for over 12 months. He presented to ED via the EMS with complaints of chest tightness, worsening symptoms of depression & suicidal ideations. He is currently seeking mood stabilization treatment & a referral to a long term substance abuse program after discharge. He is known on this unit/[place] systems from previous hospitalizations.  During this assessment, Steven Christensen reports, "The ambulance took me to the ED yesterday evening. My mother called them. I was on heroin & crack. I smoke them. I was told by the EMS that it appeared I was having a heart attack because my eyes were dilated & had stopped breathing momentarily. My mother called the ambulance because I was not doing good at the time. After smoking crack & heroin, my left arm started hurting & my chest was in pain. I apparently had  passed out, that was the reason my mother called the EMS. I have been using drugs heavily x 6 months. I have not taken any of my mental health medicines or my diabetes medicines in about 1 year. I have not seen a mental health provider in a long time. I have been in a mental health institutions/hopsitals in the past including this one. I used to go to [facility] for my medicines.I was on the Abilify injections, probably took it last about a year ago. My diabetes has messed up my eyes. I can hardly see well. I got numbness & tingling sensations to my legs. I will need to get back on my medicines. I am interested in going to a rehab program after discharge. I have had one suicide attempt in the past by overdose. I was sent to [facility] at the time".   Associated Signs/Symptoms: Depression Symptoms:  depressed mood, insomnia, anxiety, loss of energy/fatigue,  (Hypo) Manic Symptoms:  Impulsivity,  Anxiety Symptoms:  Excessive Worry,  Psychotic Symptoms:  Currently denies any hallucinations, delusions or paranoia. Hx auditory hallucinations.  PTSD Symptoms: None reported.  Total Time spent with patient: 1 hour  Past Psychiatric History: Schizophrenia.  Is the patient at risk to self? No.  Has the patient been a risk to self in the past 6 months? Yes.    Has the patient been a risk to self within the distant past? Yes.    Is the patient a risk to others? No.  Has the patient been a risk to others in the past 6 months? No.  Has the patient been a risk to others within the distant past? Yes.  Prior Inpatient Therapy: Yes Prior Outpatient Therapy: Yes  Alcohol Screening: 1. How often do you have a drink containing alcohol?: 4 or more times a week 2. How many drinks containing alcohol do you have on a typical day when you are drinking?: 10 or more 3. How often do you have six or more drinks on one occasion?: Daily or almost daily AUDIT-C Score: 12 4. How often during the last year have you  found that you were not able to stop drinking once you had started?: Daily or almost daily 5. How often during the last year have you failed to do what was normally expected from you becasue of drinking?: Daily or almost daily 6. How often during the last year have you needed a first drink in the morning to get yourself going after a heavy drinking session?: Daily or almost daily 7. How often during the last year have you had a feeling of guilt of remorse after drinking?: Monthly 8. How often during the last year have you been unable to remember what happened the night before because you had been drinking?: Less than monthly 9. Have you or someone else been injured as a result of your drinking?: Yes, but not in the last year 10. Has a relative or friend or a doctor or another health worker been concerned about your drinking or suggested you cut down?: Yes, during the last year Alcohol Use Disorder Identification Test Final Score (AUDIT): 33 Intervention/Follow-up: Alcohol Education  Substance Abuse History in the last 12 months:  Yes.     Consequences of Substance Abuse: Medical Consequences:  Liver damage, Possible death by overdose Legal Consequences:  Arrests, jail time, Loss of driving privilege. Family Consequences:  Family discord, divorce and or separation.  Previous Psychotropic Medications: Yes - Abilify  Psychological Evaluations: No   Past Medical History:  Past Medical History:  Diagnosis Date  . Anxiety   . Bipolar affective ([place])   . Depression   . Diabetes mellitus   . Schizophrenia ([place])   . Sleeping difficulties    History reviewed. No pertinent surgical history.  Family History:  Family History  Problem Relation Age of Onset  . Diabetes Maternal Aunt   . Mental illness Neg Hx    Family Psychiatric  History: Denies any hx. Of mental illness in his family.  Tobacco Screening: Have you used any form of tobacco in the last 30 days? (Cigarettes, Smokeless  Tobacco, Cigars, and/or Pipes): Yes Tobacco use, Select all that apply: 5 or more cigarettes per day Are you interested in Tobacco Cessation Medications?: Yes, will notify MD for an order Counseled patient on smoking cessation including recognizing danger situations, developing coping skills and basic information about quitting provided: Refused/Declined practical counseling  Social History: Pt is single, lives in [place] area with mother, has 36 son & a 49 year old daughter who lives with his ex-girlfriend. He is disabled & collects SSI. Social History   Substance and Sexual Activity  Alcohol Use Yes     Social History   Substance and Sexual Activity  Drug Use Yes  . Types: "Crack" cocaine, Marijuana   Comment: heroine, cocaine    Additional Social History:  Allergies:  No Known Allergies  Lab Results:  Results for orders placed or performed during the hospital encounter of 01/03/18 (from the past 48 hour(s))  Glucose, capillary     Status: Abnormal   Collection Time: 01/04/18 12:01 PM  Result Value Ref Range   Glucose-Capillary 216 (  H) 70 - 99 mg/dL   Blood Alcohol level:  Lab Results  Component Value Date   ETH <10 01/03/2018   ETH <5 46/50/3546   Metabolic Disorder Labs:  Lab Results  Component Value Date   HGBA1C 11.2 (H) 04/09/2016   MPG 275 04/09/2016   Lab Results  Component Value Date   PROLACTIN 13.3 04/09/2016   PROLACTIN 74.0 (H) 06/03/2015   Lab Results  Component Value Date   CHOL 144 04/09/2016   TRIG 118 04/09/2016   HDL 39 (L) 04/09/2016   CHOLHDL 3.7 04/09/2016   VLDL 24 04/09/2016   LDLCALC 81 04/09/2016   LDLCALC 97 06/03/2015   Current Medications: Current Facility-Administered Medications  Medication Dose Route Frequency Provider Last Rate Last Dose  . acetaminophen (TYLENOL) tablet 650 mg  650 mg Oral Q6H PRN Cobos, Myer Peer, MD      . alum & mag hydroxide-simeth (MAALOX/MYLANTA) 200-200-20 MG/5ML suspension 15 mL  15 mL Oral Q6H PRN  Cobos, Myer Peer, MD      . gabapentin (NEURONTIN) capsule 300 mg  300 mg Oral TID Lindell Spar I, NP   300 mg at 01/04/18 1119  . hydrOXYzine (ATARAX/VISTARIL) tablet 25 mg  25 mg Oral Q6H PRN Cobos, Fernando A, MD      . insulin aspart (novoLOG) injection 0-15 Units  0-15 Units Subcutaneous TID WC Lindell Spar I, NP   5 Units at 01/04/18 1218  . insulin aspart (novoLOG) injection 4 Units  4 Units Subcutaneous TID WC Lindell Spar I, NP   4 Units at 01/04/18 1219  . insulin glargine (LANTUS) injection 40 Units  40 Units Subcutaneous Gretta Began I, NP   40 Units at 01/04/18 1117  . metFORMIN (GLUCOPHAGE) tablet 500 mg  500 mg Oral BID WC Nwoko, Agnes I, NP      . nicotine (NICODERM CQ - dosed in mg/24 hours) patch 21 mg  21 mg Transdermal Daily Cobos, Myer Peer, MD   21 mg at 01/04/18 5681  . traZODone (DESYREL) tablet 100 mg  100 mg Oral QHS Nwoko, Agnes I, NP      . traZODone (DESYREL) tablet 50 mg  50 mg Oral QHS PRN Cobos, Myer Peer, MD       PTA Medications: Medications Prior to Admission  Medication Sig Dispense Refill Last Dose  . ARIPiprazole (ABILIFY) 20 MG tablet Take 1 tablet (20 mg total) by mouth daily. (Patient not taking: Reported on 01/03/2018) 14 tablet 0 Not Taking at Unknown time  . ARIPiprazole ER 400 MG SRER Inject 400 mg into the muscle every 30 (thirty) days. Next dose 2/4 (Patient not taking: Reported on 01/03/2018) 1 each 0 Not Taking at Unknown time  . gabapentin (NEURONTIN) 300 MG capsule Take 1 capsule (300 mg total) by mouth 3 (three) times daily. (Patient not taking: Reported on 01/03/2018) 90 capsule 0 Not Taking at Unknown time  . insulin glargine (LANTUS) 100 UNIT/ML injection Inject 0.4 mLs (40 Units total) into the skin every morning. (Patient not taking: Reported on 01/03/2018) 10 mL 11 Not Taking at Unknown time  . metFORMIN (GLUCOPHAGE) 500 MG tablet Take 1 tablet (500 mg total) by mouth 2 (two) times daily with a meal. (Patient not taking: Reported on  01/03/2018) 60 tablet 0 Not Taking at Unknown time  . nicotine (NICODERM CQ - DOSED IN MG/24 HOURS) 21 mg/24hr patch Place 1 patch (21 mg total) onto the skin daily. (Patient not taking: Reported on 01/03/2018) 28 patch  0 Not Taking at Unknown time  . traZODone (DESYREL) 100 MG tablet Take 1 tablet (100 mg total) by mouth at bedtime. (Patient not taking: Reported on 01/03/2018) 30 tablet 0 Not Taking at Unknown time   Musculoskeletal: Strength & Muscle Tone: within normal limits Gait & Station: normal Patient leans: N/A  Psychiatric Specialty Exam: Physical Exam  Nursing note and vitals reviewed. Constitutional: He appears well-developed.  HENT:  Head: Normocephalic.  Eyes: Pupils are equal, round, and reactive to light.  Neck: Normal range of motion.  Respiratory: Effort normal.  GI: Soft.  Genitourinary:  Genitourinary Comments: Deferred  Musculoskeletal: Normal range of motion.  Neurological: He is alert.  Skin: Skin is warm.    Review of Systems  Psychiatric/Behavioral: Positive for depression, hallucinations and substance abuse.  All other systems reviewed and are negative.   Blood pressure 132/86, pulse 62, temperature 97.7 F (36.5 C), temperature source Oral, resp. rate 18, height _0  (1.905 m), weight 93.9 kg.Body mass index is 25.87 kg/m.  General Appearance: Disheveled  Eye Contact:  Fair  Speech:  Normal Rate  Volume:  Normal  Mood:  Anxious, Depressed and Dysphoric  Affect:  Congruent  Thought Process:  Goal Directed and Descriptions of Associations: Circumstantial  Orientation:  Full (Time, Place, and Person)  Thought Content:  Hallucinations: Auditory Command:  YOU WILL DIE and Paranoid Ideation  Suicidal Thoughts:  No  Homicidal Thoughts:  No  Memory:  Immediate;   Fair Recent;   Fair Remote;   Fair  Judgement:  Impaired  Insight:  Lacking  Psychomotor Activity:  Normal  Concentration:  Concentration: Fair and Attention Span: Fair  Recall:  Steven Christensen of Knowledge:  Fair  Language:  Fair  Akathisia:  No  Handed:  Right  AIMS (if indicated):     Assets:  Desire for Improvement Social Support  ADL's:  Intact  Cognition:  WNL  Sleep:  Number of Hours: 6.25   Daily contact with patient to assess and evaluate symptoms and progress in treatment and Medication management  Treatment Plan/Recommendations: 1. Admit for crisis management and stabilization, estimated length of stay 3-5 days.   2. Medication management to reduce current symptoms to base line and improve the patient's overall level of functioning: See MAR, Md's SRA & treatment plan.   Observation Level/Precautions:  15 minute checks  Labs: Per ED, UDS (+) for opioid & Cocaine  Psychotherapy: Group therapy, AA/NA meetings  Medications: See MAR  Consultations: As needed  Discharge Concerns: Mood stability, safety  Length of stay: 2-4 days  Other: Admit to the [facility].   Physician Treatment Plan for Primary Diagnosis: Polysubstance (including opioids) dependence, daily use (HCC)  Long Term Goal(s): Improvement in symptoms so as ready for discharge  Short Term Goals: Ability to identify changes in lifestyle to reduce recurrence of condition will improve, Ability to verbalize feelings will improve and Ability to demonstrate self-control will improve  Physician Treatment Plan for Secondary Diagnosis: Principal Problem:   Polysubstance (including opioids) dependence, daily use (HCC) Active Problems:   Substance induced mood disorder (HCC)   MDD (major depressive disorder)  Long Term Goal(s): Improvement in symptoms so as ready for discharge  Short Term Goals: Ability to identify and develop effective coping behaviors will improve, Compliance with prescribed medications will improve and Ability to identify triggers associated with substance abuse/mental health issues will improve  I certify that inpatient services furnished can reasonably be expected to improve the  patient's condition.  Lindell Spar, NP, PMHNP, FNP-BC 10/2/201912:34 PM I have discussed case with NP and have met with patient  Agree with NP note and assessment  47 year old male, single, states he had been staying with mother recently , unemployed, denies legal issues   Presented to ED on 10/1 for chest pain, diaphoresis. Was medically cleared in ED.  Reports that prior to chest pain onset  he had been using  A mixture of crack cocaine and heroin , which he states he was smoking . Reports he was using cocaine  a few times a month, not daily, but that he had never before used heroin. He does report daily alcohol consumption, about 12 beers per day.  Patient  reports he has history of auditory hallucinations but not over the last few weeks. He does, however,  report he feels that " there is a group of people who threaten me all the time", and states that strangers come up to him and tell him " you are going to die, we are going to cut your legs off ".  Patient reports history of psychiatric illness, and states that in the past he has been diagnosed with schizophrenia and with Bipolar Disorder. He reports he had not been taking psychiatric medications x 1 year. Reports history of a prior suicidal attempt many years ago by overdosing . Patient was admitted to [facility] in January 2018 for psychosis, auditory hallucinations ( hearing threatening voices ). At the time was diagnosed with Schizophrenia. At the time was discharged on Abilify Maintena Injection, Neurontin, Trazodone   Medical history- reports history of DM, HTN, denies history of seizures or severe alcohol WDL syndrome   Dx- Schizophrenia by history, Alcohol Use Disorder, Cocaine Use Disorder   Plan- Inpatient admission.  Currently patient is not presenting with symptoms of alcohol WDL- will start Librium PRN for alcohol WDL as needed . Start Abilify 5 mgrs QDAY initially , Start Neurontin at 100 mgrs TID

## 2018-01-04 NOTE — [facility] Group Notes (Signed)
Adult Psychoeducational Group Note  Date:  01/04/2018 Time:  9:40 PM  Group Topic/Focus:  Wrap-Up Group:   The focus of this group is to help patients review their daily goal of treatment and discuss progress on daily workbooks.  Participation Level:  Minimal  Participation Quality:  Appropriate  Affect:  Appropriate  Cognitive:  Appropriate  Insight: Limited  Engagement in Group:  Limited  Modes of Intervention:  Discussion  Additional Comments:  Patient only shared that she is having a good day and that his goal is to go to long term placement.   Lyndee Hensen 01/04/2018, 9:40 PM

## 2018-01-04 NOTE — Progress Notes (Signed)
D:  Patient denied SI and HI, contracts for safety.  Denied A/V hallucinations.   A:  Medications administered per MD orders.  Emotional support and encouragement given patient. R:  Safety maintained with 15 minute checks.  

## 2018-01-04 NOTE — Plan of Care (Signed)
Nurse discussed anxiety; depression and coping skills with patient.  

## 2018-01-04 NOTE — [facility] Suicide Risk Assessment (Signed)
[facility] INPATIENT:  Family/Significant Other Suicide Prevention Education  Suicide Prevention Education:  Patient Refusal for Family/Significant Other Suicide Prevention Education: The patient Steven Christensen has refused to provide written consent for family/significant other to be provided Family/Significant Other Suicide Prevention Education during admission and/or prior to discharge.  Physician notified.  SPE completed with patient, as patient refused to consent to family contact. Patient was encouraged to share information with support network, ask questions, and talk about any concerns relating to SPE. Patient denies access to guns/firearms and verbalized understanding of information provided. Mobile Crisis information also provided to patient.    [name] 01/04/2018, 11:03 AM

## 2018-01-04 NOTE — Progress Notes (Signed)
Patient reports that he had a good day but did not go into further detail. He is unsure as to what his goal will be for tomorrow.

## 2018-01-04 NOTE — [facility] Group Notes (Signed)
LCSW Group Therapy Note 01/04/2018 1:03 PM  Type of Therapy/Topic: Group Therapy: Feelings about Diagnosis  Participation Level: Active   Description of Group:  This group will allow patients to explore their thoughts and feelings about diagnoses they have received. Patients will be guided to explore their level of understanding and acceptance of these diagnoses. Facilitator will encourage patients to process their thoughts and feelings about the reactions of others to their diagnosis and will guide patients in identifying ways to discuss their diagnosis with significant others in their lives. This group will be process-oriented, with patients participating in exploration of their own experiences, giving and receiving support, and processing challenge from other group members.  Therapeutic Goals: 1. Patient will demonstrate understanding of diagnosis as evidenced by identifying two or more symptoms of the disorder 2. Patient will be able to express two feelings regarding the diagnosis 3. Patient will demonstrate their ability to communicate their needs through discussion and/or role play  Summary of Patient Progress:  Steven Christensen was engaged and participated throughout the group session. Steven Christensen states "it does not feel good but it is what it is". Steven Christensen states that he wants to figure out a new housing situation while in the hospital.     Therapeutic Modalities:  Cognitive Behavioral Therapy Brief Therapy Feelings Identification    Avenir Lozinski Catalina [place] Clinical Social Worker

## 2018-01-05 LAB — LIPID PANEL
Cholesterol: 145 mg/dL (ref 0–200)
HDL: 45 mg/dL (ref >40)
LDL Cholesterol: 86 mg/dL (ref 0–99)
Total CHOL/HDL Ratio: 3.2 RATIO
Triglycerides: 72 mg/dL (ref <150)
VLDL: 14 mg/dL (ref 0–40)

## 2018-01-05 LAB — GLUCOSE, CAPILLARY
GLUCOSE-CAPILLARY: 171 mg/dL — AB (ref 70–99)
GLUCOSE-CAPILLARY: 154 mg/dL — AB (ref 70–99)
Glucose-Capillary: 138 mg/dL — ABNORMAL HIGH (ref 70–99)
Glucose-Capillary: 85 mg/dL (ref 70–99)

## 2018-01-05 LAB — TSH: TSH: 0.757 u[IU]/mL (ref 0.350–4.500)

## 2018-01-05 NOTE — Plan of Care (Signed)
Progress note  D: pt found in bed; compliant with medication administration. Pt states he slept well. Pt rates his depression/hopelessness/anxiety a 0/0/1 out of 10 respectively. Pt has no physical complaints and rates his pain at a 0/10. Pt states his goal for today is to get into another place so he can get himself right. Pt will achieve this by talking more in class. Pt denies any si/hi/ah/vh and verbally agrees to approach staff if these become apparent or before harming himself while at [facility]. A: pt provided support and encouragement. Pt given medications per protocol and standing orders. Q26m safety checks implemented and continued.  R: pt safe on the unit. Will continue to monitor.   Pt progressing in the following metrics  Problem: Education: Goal: Knowledge of [facility] Education information/materials will improve Outcome: Progressing Goal: Mental status will improve Outcome: Progressing Goal: Verbalization of understanding the information provided will improve Outcome: Progressing   Problem: Activity: Goal: Interest or engagement in activities will improve Outcome: Progressing Goal: Sleeping patterns will improve Outcome: Progressing   Problem: Coping: Goal: Ability to verbalize frustrations and anger appropriately will improve Outcome: Progressing

## 2018-01-05 NOTE — Therapy (Signed)
Occupational Therapy Group Note  Date:  01/05/2018 Time:  8:33 AM  Group Topic/Focus:  Stress Management  Participation Level:  Minimal  Participation Quality:  Inattentive  Affect:  Flat  Cognitive:  Appropriate  Insight: Limited  Engagement in Group:  Limited  Modes of Intervention:  Activity, Discussion, Education and Socialization  Additional Comments:    S: "My scale says I am moderately stressed"  O: Educated given on stress management strategies to apply when reintegrating into community. Stress symptom checklist administered for completion, focusing on most common symptoms felt. Stress symptom tools worksheet administered and discussed in relation to negative vs positive coping mechanisms. Gratitude journal prompts given at end of session.  A: Pt presents to group as flat, minimally engaged. Completed stress symptom checklist, sharing that he is moderately stressed. Pt did not offer further information this date. Pt then pulled from provider staff and did not return.  P: Handouts given at end of session to facilitate carryover. OT will continue to follow acutely.  Dalphine Handing, MSOT, OTR/L Behavioral Health OT/ Acute Relief OT  Dalphine Handing 01/05/2018, 8:33 AM

## 2018-01-05 NOTE — Progress Notes (Signed)
Pt is observed playing cards with peers in the dayroom. Pt attended wrap-up this evening. Pt appears animated/anxious in affect and mood. Pt denies SI/HI/AVH/Pain at this time. Pt was given an extra snack for the bedside. CBG at bedtime was 85. No new c/o's. PRN vistaril and trazodone requested and given. Support and encouragement provided. Will continue with POC.

## 2018-01-05 NOTE — [facility] Group Notes (Signed)
LCSW Group Therapy Note 01/05/2018 10:09 AM  Type of Therapy and Topic: Group Therapy: Avoiding Self-Sabotaging and Enabling Behaviors  Participation Level: Minimal  Description of Group:  In this group, patients will learn how to identify obstacles, self-sabotaging and enabling behaviors, as well as: what are they, why do we do them and what needs these behaviors meet. Discuss unhealthy relationships and how to have positive healthy boundaries with those that sabotage and enable. Explore aspects of self-sabotage and enabling in yourself and how to limit these self-destructive behaviors in everyday life.  Therapeutic Goals: 1. Patient will identify one obstacle that relates to self-sabotage and enabling behaviors 2. Patient will identify one personal self-sabotaging or enabling behavior they did prior to admission 3. Patient will state a plan to change the above identified behavior 4. Patient will demonstrate ability to communicate their needs through discussion and/or role play.   Summary of Patient Progress:   Steven Christensen was attentive, however he did not contribute to the group's discussion.    Therapeutic Modalities:  Cognitive Behavioral Therapy Person-Centered Therapy Motivational Interviewing   Baldo Daub LCSWA Clinical Social Worker

## 2018-01-05 NOTE — Progress Notes (Signed)
[facility] MD Progress Note  01/05/2018 11:51 AM Steven Christensen  MRN:  [id number] Subjective: Patient reports "I am feeling better".  Today denies suicidal ideations, describes improving mood, denies hallucinations.  Tolerating medications well thus far. Objective: I have discussed case with treatment team and have met with patient. 47 year old male, presented to ED initially for chest pain, reported recent use of cocaine and heroin as well as daily alcohol consumption.  History of prior diagnosis of schizophrenia and described threatening auditory hallucinations. Today reports feeling better, describes improving mood and presents with a more reactive affect, smiles appropriately at times during session.  Denies current suicidal ideations and is future oriented.  He also denies hallucinations, states that they have resolved and currently does not appear internally preoccupied. He is not currently presenting with withdrawal symptoms-no tremors, no diaphoresis, no psychomotor agitation, no restlessness, no visual disturbances. No disruptive or agitated behaviors on unit. Denies suicidal ideations. Labs reviewed, TSH WNL, lipid panel unremarkable. Principal Problem: Polysubstance (including opioids) dependence, daily use ([place]) Diagnosis:   Patient Active Problem List   Diagnosis Date Noted  . Substance induced mood disorder ([place]) [F19.94] 01/04/2018  . Polysubstance (including opioids) dependence, daily use ([place]) [F11.20, F19.20] 01/04/2018  . MDD (major depressive disorder) [F32.9] 01/03/2018  . Paranoid schizophrenia ([place]) [F20.0] 04/08/2016  . Alcohol use disorder, moderate, dependence ([place]) [F10.20] 04/08/2016  . Cocaine use disorder, moderate, dependence ([place]) [F14.20] 04/08/2016  . Diabetes ([place]) [E11.9] 06/02/2015  . Tobacco use disorder [F17.200] 06/02/2015  . Hemorrhoids [K64.9] 08/02/2013   Total Time spent with patient: 20 minutes  Past Psychiatric History:   Past Medical History:   Past Medical History:  Diagnosis Date  . Anxiety   . Bipolar affective ([place])   . Depression   . Diabetes mellitus   . Schizophrenia ([place])   . Sleeping difficulties    History reviewed. No pertinent surgical history. Family History:  Family History  Problem Relation Age of Onset  . Diabetes Maternal Aunt   . Mental illness Neg Hx    Family Psychiatric  History:  Social History:  Social History   Substance and Sexual Activity  Alcohol Use Yes     Social History   Substance and Sexual Activity  Drug Use Yes  . Types: "Crack" cocaine, Marijuana   Comment: heroine, cocaine    Social History   Socioeconomic History  . Marital status: Single    Spouse name: Not on file  . Number of children: Not on file  . Years of education: Not on file  . Highest education level: Not on file  Occupational History  . Not on file  Social Needs  . Financial resource strain: Not on file  . Food insecurity:    Worry: Not on file    Inability: Not on file  . Transportation needs:    Medical: Not on file    Non-medical: Not on file  Tobacco Use  . Smoking status: Current Every Day Smoker    Packs/day: 1.00    Years: 35.00    Pack years: 35.00    Types: Cigarettes  . Smokeless tobacco: Never Used  Substance and Sexual Activity  . Alcohol use: Yes  . Drug use: Yes    Types: "Crack" cocaine, Marijuana    Comment: heroine, cocaine  . Sexual activity: Yes    Birth control/protection: None  Lifestyle  . Physical activity:    Days per week: Not on file    Minutes per session: Not on file  .  Stress: Not on file  Relationships  . Social connections:    Talks on phone: Not on file    Gets together: Not on file    Attends religious service: Not on file    Active member of club or organization: Not on file    Attends meetings of clubs or organizations: Not on file    Relationship status: Not on file  Other Topics Concern  . Not on file  Social History Narrative  . Not on file    Additional Social History:   Sleep: Improving  Appetite:  Good  Current Medications: Current Facility-Administered Medications  Medication Dose Route Frequency Provider Last Rate Last Dose  . acetaminophen (TYLENOL) tablet 650 mg  650 mg Oral Q6H PRN Janelly Switalski, Myer Peer, MD      . alum & mag hydroxide-simeth (MAALOX/MYLANTA) 200-200-20 MG/5ML suspension 15 mL  15 mL Oral Q6H PRN Jaston Havens A, MD      . ARIPiprazole (ABILIFY) tablet 5 mg  5 mg Oral Daily Halie Gass, Myer Peer, MD   5 mg at 01/05/18 0749  . chlordiazePOXIDE (LIBRIUM) capsule 25 mg  25 mg Oral Q6H PRN Kaislee Chao A, MD      . gabapentin (NEURONTIN) capsule 100 mg  100 mg Oral TID Gerarda Conklin, Myer Peer, MD   100 mg at 01/05/18 0749  . hydrOXYzine (ATARAX/VISTARIL) tablet 25 mg  25 mg Oral Q6H PRN Preslee Regas, Myer Peer, MD   25 mg at 01/04/18 2103  . insulin aspart (novoLOG) injection 0-15 Units  0-15 Units Subcutaneous TID WC Lindell Spar I, NP   3 Units at 01/05/18 [phone]  . insulin aspart (novoLOG) injection 4 Units  4 Units Subcutaneous TID WC Lindell Spar I, NP   4 Units at 01/05/18 [phone]  . insulin glargine (LANTUS) injection 40 Units  40 Units Subcutaneous Gretta Began I, NP   40 Units at 01/05/18 [phone]  . loperamide (IMODIUM) capsule 2-4 mg  2-4 mg Oral PRN Denai Caba, Myer Peer, MD      . metFORMIN (GLUCOPHAGE) tablet 500 mg  500 mg Oral BID WC Nwoko, Agnes I, NP   500 mg at 01/05/18 0749  . multivitamin with minerals tablet 1 tablet  1 tablet Oral Daily Kaeson Kleinert, Myer Peer, MD   1 tablet at 01/05/18 0749  . nicotine (NICODERM CQ - dosed in mg/24 hours) patch 21 mg  21 mg Transdermal Daily Antonique Langford, Myer Peer, MD   21 mg at 01/05/18 0749  . ondansetron (ZOFRAN-ODT) disintegrating tablet 4 mg  4 mg Oral Q6H PRN Erine Phenix A, MD      . thiamine (VITAMIN B-1) tablet 100 mg  100 mg Oral Daily Nyanna Heideman, Myer Peer, MD   100 mg at 01/05/18 0749  . traZODone (DESYREL) tablet 50 mg  50 mg Oral QHS PRN Joseff Luckman, Myer Peer, MD   50 mg at  01/04/18 2103    Lab Results:  Results for orders placed or performed during the hospital encounter of 01/03/18 (from the past 48 hour(s))  Glucose, capillary     Status: Abnormal   Collection Time: 01/04/18 12:01 PM  Result Value Ref Range   Glucose-Capillary 216 (H) 70 - 99 mg/dL  Glucose, capillary     Status: Abnormal   Collection Time: 01/04/18  5:08 PM  Result Value Ref Range   Glucose-Capillary 154 (H) 70 - 99 mg/dL  Glucose, capillary     Status: Abnormal   Collection Time: 01/04/18  8:35 PM  Result Value  Ref Range   Glucose-Capillary 146 (H) 70 - 99 mg/dL   Comment 1 Notify RN   Glucose, capillary     Status: Abnormal   Collection Time: 01/05/18  6:19 AM  Result Value Ref Range   Glucose-Capillary 171 (H) 70 - 99 mg/dL  Lipid panel     Status: None   Collection Time: 01/05/18  6:28 AM  Result Value Ref Range   Cholesterol 145 0 - 200 mg/dL   Triglycerides 72 <150 mg/dL   HDL 45 >40 mg/dL   Total CHOL/HDL Ratio 3.2 RATIO   VLDL 14 0 - 40 mg/dL   LDL Cholesterol 86 0 - 99 mg/dL    Comment:        Total Cholesterol/HDL:CHD Risk Coronary Heart Disease Risk Table                     Men   Women  1/2 Average Risk   3.4   3.3  Average Risk       5.0   4.4  2 X Average Risk   9.6   7.1  3 X Average Risk  23.4   11.0        Use the calculated Patient Ratio above and the CHD Risk Table to determine the patient's CHD Risk.        ATP III CLASSIFICATION (LDL):  <100     mg/dL   Optimal  100-129  mg/dL   Near or Above                    Optimal  130-159  mg/dL   Borderline  160-189  mg/dL   High  >190     mg/dL   Very High Performed at [facility] [address], [place], [place] [address]   TSH     Status: None   Collection Time: 01/05/18  6:28 AM  Result Value Ref Range   TSH 0.757 0.350 - 4.500 uIU/mL    Comment: Performed by a 3rd Generation assay with a functional sensitivity of <=0.01 uIU/mL. Performed at [facility],  [place] [address] [place], [place] [address]     Blood Alcohol level:  Lab Results  Component Value Date   ETH <10 01/03/2018   ETH <5 15/17/6160    Metabolic Disorder Labs: Lab Results  Component Value Date   HGBA1C 11.2 (H) 04/09/2016   MPG 275 04/09/2016   Lab Results  Component Value Date   PROLACTIN 13.3 04/09/2016   PROLACTIN 74.0 (H) 06/03/2015   Lab Results  Component Value Date   CHOL 145 01/05/2018   TRIG 72 01/05/2018   HDL 45 01/05/2018   CHOLHDL 3.2 01/05/2018   VLDL 14 01/05/2018   LDLCALC 86 01/05/2018   LDLCALC 81 04/09/2016    Physical Findings: AIMS: Facial and Oral Movements Muscles of Facial Expression: None, normal Lips and Perioral Area: None, normal Jaw: None, normal Tongue: None, normal,Extremity Movements Upper (arms, wrists, hands, fingers): None, normal Lower (legs, knees, ankles, toes): None, normal, Trunk Movements Neck, shoulders, hips: None, normal, Overall Severity Severity of abnormal movements (highest score from questions above): None, normal Incapacitation due to abnormal movements: None, normal Patient's awareness of abnormal movements (rate only patient's report): No Awareness, Dental Status Current problems with teeth and/or dentures?: No Does patient usually wear dentures?: No  CIWA:  CIWA-Ar Total: 1 COWS:  COWS Total Score: 1  Musculoskeletal: Strength & Muscle Tone: within normal  limits Gait & Station: normal Patient leans: N/A  Psychiatric Specialty Exam: Physical Exam  ROS no headache, no chest pain, no shortness of breath, no nausea, no vomiting  Blood pressure (!) 165/82, pulse 66, temperature 98.5 F (36.9 C), temperature source Oral, resp. rate 18, height 6' 3"  (1.905 m), weight 93.9 kg.Body mass index is 25.87 kg/m.  General Appearance: Improving grooming  Eye Contact:  Improving  Speech:  Normal Rate  Volume:  Normal  Mood:  Reports improving mood  Affect:  Becoming more reactive  Thought Process:   Linear and Descriptions of Associations: Intact-no clear thought disorder noted today  Orientation:  Other:  Fully alert and attentive  Thought Content:  No hallucinations, no delusions, denies hearing voices today and does not appear internally preoccupied  Suicidal Thoughts:  No-denies suicidal or self-injurious ideations  Homicidal Thoughts:  No -denies homicidal ideations  Memory:  Recent and remote grossly intact  Judgement:  Other:  Improving  Insight:  Fair  Psychomotor Activity:  Normal-no psychomotor agitation, no restlessness, no distal tremors noted  Concentration:  Concentration: Good and Attention Span: Good  Recall:  Good  Fund of Knowledge:  Good  Language:  Good  Akathisia:  Negative  Handed:  Right  AIMS (if indicated):     Assets:  Desire for Improvement Resilience  ADL's:  Intact  Cognition:  WNL  Sleep:  Number of Hours: 5.75   Assessment -47 year old male, presented to ED initially for chest pain, reported recent use of cocaine and heroin as well as daily alcohol consumption.  History of prior diagnosis of schizophrenia and described threatening auditory hallucinations. Patient reports significant improvement compared to admission.  Describes improving mood and is presenting with a fuller range of affect, denies further or ongoing hallucinations and does not appear internally preoccupied/no delusions are expressed at this time.  He is not presenting with symptoms of alcohol withdrawal at this time. Thus far tolerating medications well. Treatment Plan Summary: Daily contact with patient to assess and evaluate symptoms and progress in treatment, Medication management, Plan Inpatient treatment and Medications as below Encourage group and milieu participation to work on coping skills and symptom reduction Encourage efforts to work on sobriety and abstinence Treatment team working on disposition planning Continue Abilify 5 mg daily for mood disorder/psychosis Continue  Neurontin 100 mg 3 times a day for anxiety and pain Continue Trazodone 50 mg nightly PRN for insomnia Continue Vistaril 25 mg every 6 hours PRN for anxiety Continue Librium 25 mg every 6 hours as needed as needed for alcohol withdrawal per CIWA score Continue Lantus insulin/NovoLog meal coverage/Glucophage for DM- CBG today 171.  Jenne Campus, MD 01/05/2018, 11:51 AM

## 2018-01-05 NOTE — Progress Notes (Signed)
Pt reports he had a good day.  He denies SI/HI/AVH at this time.  He voiced no concerns to Clinical research associate, and requested a sleep aid which he was given.  He denies having any obvious withdrawal symptoms.  His CBG was 146 this evening.  He has been in the dayroom during the evening and attended evening group.  He is appropriate and cooperative.  Pt makes his needs known to staff.  Support and encouragement offered.  Discharge plans are in process.  Pt wants to go to long term treatment after discharge.  Safety maintained with q15 minute checks.

## 2018-01-05 NOTE — Progress Notes (Signed)
Adult Psychoeducational Group Note  Date:  01/05/2018 Time:  9:04 PM  Group Topic/Focus:  Wrap-Up Group:   The focus of this group is to help patients review their daily goal of treatment and discuss progress on daily workbooks.  Participation Level:  Active  Participation Quality:  Appropriate  Affect:  Appropriate  Cognitive:  Alert  Insight: Appropriate  Engagement in Group:  Engaged  Modes of Intervention:  Discussion  Additional Comments:  Pt stated that today has been a good day. Pt stated that he will be going into long term treatment when he leaves here.   Kaleen Odea R 01/05/2018, 9:04 PM

## 2018-01-06 DIAGNOSIS — F419 Anxiety disorder, unspecified: Secondary | ICD-10-CM

## 2018-01-06 DIAGNOSIS — G4700 Insomnia, unspecified: Secondary | ICD-10-CM

## 2018-01-06 LAB — GLUCOSE, CAPILLARY
Glucose-Capillary: 180 mg/dL — ABNORMAL HIGH (ref 70–99)
Glucose-Capillary: 114 mg/dL — ABNORMAL HIGH (ref 70–99)
Glucose-Capillary: 132 mg/dL — ABNORMAL HIGH (ref 70–99)
Glucose-Capillary: 166 mg/dL — ABNORMAL HIGH (ref 70–99)

## 2018-01-06 LAB — HEMOGLOBIN A1C
Hgb A1c MFr Bld: 10.3 % — ABNORMAL HIGH (ref 4.8–5.6)
MEAN PLASMA GLUCOSE: 249 mg/dL

## 2018-01-06 MED ORDER — PHENYLEPH-SHARK LIV OIL-MO-PET 0.25-3-14-71.9 % RE OINT
TOPICAL_OINTMENT | Freq: Two times a day (BID) | RECTAL | Status: DC | PRN
  Administered 2018-01-06: 17:00:00 via RECTAL
  Filled 2018-01-06: qty 28.4

## 2018-01-06 MED ORDER — HYDROCORTISONE 1 % EX CREA
TOPICAL_CREAM | Freq: Three times a day (TID) | CUTANEOUS | Status: DC | PRN
  Administered 2018-01-06: 17:00:00 via TOPICAL
  Filled 2018-01-06: qty 28

## 2018-01-06 NOTE — Progress Notes (Addendum)
Pt attended AA meeting this evening.Pt is observed interacting with peers in the dayroom. Pt appears animated/anxious in affect and mood. Pt denies SI/HI/AVH at this time.Rates pain 8/10; generalized. Pt c/o of diarrhea and gas this evening.PRN vistaril, Maalox, imodium, tylenol and trazodone requested and given. CBG was 166 at bedtime. Support and encouragement provided. Will continue with POC.

## 2018-01-06 NOTE — Progress Notes (Signed)
Inpatient Diabetes Program Recommendations  AACE/ADA: New Consensus Statement on Inpatient Glycemic Control (2015)  Target Ranges:  Prepandial:   less than 140 mg/dL      Peak postprandial:   less than 180 mg/dL (1-2 hours)      Critically ill patients:  140 - 180 mg/dL   Lab Results  Component Value Date   GLUCAP 114 (H) 01/06/2018   HGBA1C 10.3 (H) 01/05/2018    Review of Glycemic Control  Diabetes history: DM2 Outpatient Diabetes medications: Lantus 40 units QAM, metformin 500 mg bid (not taking per Med Rec Current orders for Inpatient glycemic control: Lantus 40 units QD, Novolog 0-15 units tidwc + 4 units tidwc  HgbA1C - 10.3% uncontrolled Blood sugars acceptable.  Inpatient Diabetes Program Recommendations:     Will need to f/u with PCP after discharge for management of diabetes.  Please encourage pt to monitor his blood sugars and take logbook to MD appt for review. ? Why pt not taking insulin and OHAs as prescribed?  Will continue to follow while inpatient.  Thank you. Ailene Ards, RD, LDN, CDE Inpatient Diabetes Coordinator [phone]

## 2018-01-06 NOTE — Progress Notes (Addendum)
[facility] MD Progress Note  01/06/2018 11:52 AM Steven Christensen  MRN:  [id number]  Subjective: Steven Christensen reports, "My mood is good. I am doing well. I am sleeping well, no anxiety. My medicines has been helpful. The only complaints that I have today is, I have bad hemorrhoid. It hurts & bleeds when I use the bathroom".  47 year old male, presented to ED initially for chest pain, reported recent use of cocaine and heroin as well as daily alcohol consumption.  History of prior diagnosis of schizophrenia and described threatening auditory hallucinations. Today reports feeling better, describes improving mood and presents with a more reactive affect, smiles appropriately at times during session.  Denies current suicidal ideations and is future oriented.  Steven Christensen also denies hallucinations, states that they have resolved and currently does not appear internally preoccupied. Steven Christensen is not currently presenting with withdrawal symptoms-no tremors, no diaphoresis, no psychomotor agitation, no restlessness, no visual disturbances. No disruptive or agitated behaviors on unit. Denies suicidal ideations. Labs reviewed, TSH WNL, lipid panel unremarkable.  Steven Christensen is seen, chart reviewed. The chart findings discussed with the treatment team. Patient presents alert & oriented x 4. Steven Christensen is visible on the unit, attending group sessions. Steven Christensen reports today that Steven Christensen is doing well. Steven Christensen denies any depressive or anxiety symptoms. Steven Christensen denies any psychosis, delusional thoughts or paranoia. Steven Christensen is taking & tolerating his treatment regimen. Steven Christensen denies any SIHI. Steven Christensen does not appear to be responding to any internal stimuli. The only complaints for the day is hemorrhoids problems. Will initiate some hemorrhoidal cream. Steven Christensen has agreed to continue current plan of care already in progress. Steven Christensen is in no apparent distress.  Principal Problem: Polysubstance (including opioids) dependence, daily use (HCC)  Diagnosis:   Patient Active Problem List   Diagnosis Date  Noted  . Substance induced mood disorder (HCC) [F19.94] 01/04/2018    Priority: High  . Polysubstance (including opioids) dependence, daily use (HCC) [F11.20, F19.20] 01/04/2018    Priority: High  . MDD (major depressive disorder) [F32.9] 01/03/2018  . Paranoid schizophrenia (HCC) [F20.0] 04/08/2016  . Alcohol use disorder, moderate, dependence (HCC) [F10.20] 04/08/2016  . Cocaine use disorder, moderate, dependence (HCC) [F14.20] 04/08/2016  . Diabetes (HCC) [E11.9] 06/02/2015  . Tobacco use disorder [F17.200] 06/02/2015  . Hemorrhoids [K64.9] 08/02/2013   Total Time spent with patient: 15 minutes  Past Psychiatric History: See H&P  Past Medical History:  Past Medical History:  Diagnosis Date  . Anxiety   . Bipolar affective (HCC)   . Depression   . Diabetes mellitus   . Schizophrenia (HCC)   . Sleeping difficulties    History reviewed. No pertinent surgical history.  Family History:  Family History  Problem Relation Age of Onset  . Diabetes Maternal Aunt   . Mental illness Neg Hx    Family Psychiatric  History: See H&P  Social History:  Social History   Substance and Sexual Activity  Alcohol Use Yes     Social History   Substance and Sexual Activity  Drug Use Yes  . Types: "Crack" cocaine, Marijuana   Comment: heroine, cocaine    Social History   Socioeconomic History  . Marital status: Single    Spouse name: Not on file  . Number of children: Not on file  . Years of education: Not on file  . Highest education level: Not on file  Occupational History  . Not on file  Social Needs  . Financial resource strain: Not on file  . Food insecurity:  Worry: Not on file    Inability: Not on file  . Transportation needs:    Medical: Not on file    Non-medical: Not on file  Tobacco Use  . Smoking status: Current Every Day Smoker    Packs/day: 1.00    Years: 35.00    Pack years: 35.00    Types: Cigarettes  . Smokeless tobacco: Never Used  Substance and  Sexual Activity  . Alcohol use: Yes  . Drug use: Yes    Types: "Crack" cocaine, Marijuana    Comment: heroine, cocaine  . Sexual activity: Yes    Birth control/protection: None  Lifestyle  . Physical activity:    Days per week: Not on file    Minutes per session: Not on file  . Stress: Not on file  Relationships  . Social connections:    Talks on phone: Not on file    Gets together: Not on file    Attends religious service: Not on file    Active member of club or organization: Not on file    Attends meetings of clubs or organizations: Not on file    Relationship status: Not on file  Other Topics Concern  . Not on file  Social History Narrative  . Not on file   Additional Social History:   Sleep: Good  Appetite:  Good  Current Medications: Current Facility-Administered Medications  Medication Dose Route Frequency Provider Last Rate Last Dose  . acetaminophen (TYLENOL) tablet 650 mg  650 mg Oral Q6H PRN Amad Mau, Rockey Situ, MD   650 mg at 01/06/18 0634  . alum & mag hydroxide-simeth (MAALOX/MYLANTA) 200-200-20 MG/5ML suspension 15 mL  15 mL Oral Q6H PRN Roger Fasnacht A, MD      . ARIPiprazole (ABILIFY) tablet 5 mg  5 mg Oral Daily Daryus Sowash, Rockey Situ, MD   5 mg at 01/06/18 0755  . chlordiazePOXIDE (LIBRIUM) capsule 25 mg  25 mg Oral Q6H PRN Harol Shabazz A, MD      . gabapentin (NEURONTIN) capsule 100 mg  100 mg Oral TID Josslyn Ciolek, Rockey Situ, MD   100 mg at 01/06/18 0756  . hydrocortisone cream 1 %   Topical TID PRN Mychele Seyller, Rockey Situ, MD      . hydrOXYzine (ATARAX/VISTARIL) tablet 25 mg  25 mg Oral Q6H PRN Latricia Cerrito, Rockey Situ, MD   25 mg at 01/05/18 2112  . insulin aspart (novoLOG) injection 0-15 Units  0-15 Units Subcutaneous TID WC Armandina Stammer I, NP   3 Units at 01/06/18 [phone]  . insulin aspart (novoLOG) injection 4 Units  4 Units Subcutaneous TID WC Armandina Stammer I, NP   4 Units at 01/06/18 [phone]  . insulin glargine (LANTUS) injection 40 Units  40 Units Subcutaneous Jobe Gibbon I, NP   40 Units at 01/06/18 [phone]  . loperamide (IMODIUM) capsule 2-4 mg  2-4 mg Oral PRN Josafat Enrico, Rockey Situ, MD   2 mg at 01/06/18 0634  . metFORMIN (GLUCOPHAGE) tablet 500 mg  500 mg Oral BID WC Nwoko, Agnes I, NP   500 mg at 01/06/18 0756  . multivitamin with minerals tablet 1 tablet  1 tablet Oral Daily Nichole Neyer, Rockey Situ, MD   1 tablet at 01/06/18 0755  . nicotine (NICODERM CQ - dosed in mg/24 hours) patch 21 mg  21 mg Transdermal Daily Danyael Alipio, Rockey Situ, MD   21 mg at 01/06/18 0759  . ondansetron (ZOFRAN-ODT) disintegrating tablet 4 mg  4 mg Oral Q6H PRN Kailie Polus, Rockey Situ,  MD      . thiamine (VITAMIN B-1) tablet 100 mg  100 mg Oral Daily Mariabella Nilsen, Rockey Situ, MD   100 mg at 01/06/18 0755  . traZODone (DESYREL) tablet 50 mg  50 mg Oral QHS PRN Davinder Haff, Rockey Situ, MD   50 mg at 01/05/18 2112    Lab Results:  Results for orders placed or performed during the hospital encounter of 01/03/18 (from the past 48 hour(s))  Glucose, capillary     Status: Abnormal   Collection Time: 01/04/18 12:01 PM  Result Value Ref Range   Glucose-Capillary 216 (H) 70 - 99 mg/dL  Glucose, capillary     Status: Abnormal   Collection Time: 01/04/18  5:08 PM  Result Value Ref Range   Glucose-Capillary 154 (H) 70 - 99 mg/dL  Glucose, capillary     Status: Abnormal   Collection Time: 01/04/18  8:35 PM  Result Value Ref Range   Glucose-Capillary 146 (H) 70 - 99 mg/dL   Comment 1 Notify RN   Glucose, capillary     Status: Abnormal   Collection Time: 01/05/18  6:19 AM  Result Value Ref Range   Glucose-Capillary 171 (H) 70 - 99 mg/dL  Hemoglobin Z6X     Status: Abnormal   Collection Time: 01/05/18  6:28 AM  Result Value Ref Range   Hgb A1c MFr Bld 10.3 (H) 4.8 - 5.6 %    Comment: (NOTE)         Prediabetes: 5.7 - 6.4         Diabetes: >6.4         Glycemic control for adults with diabetes: <7.0    Mean Plasma Glucose 249 mg/dL    Comment: (NOTE) Performed At: [facility] [address]  [place], [place] [id number] Jolene Schimke MD WJ:[id number]   Lipid panel     Status: None   Collection Time: 01/05/18  6:28 AM  Result Value Ref Range   Cholesterol 145 0 - 200 mg/dL   Triglycerides 72 <213 mg/dL   HDL 45 >08 mg/dL   Total CHOL/HDL Ratio 3.2 RATIO   VLDL 14 0 - 40 mg/dL   LDL Cholesterol 86 0 - 99 mg/dL    Comment:        Total Cholesterol/HDL:CHD Risk Coronary Heart Disease Risk Table                     Men   Women  1/2 Average Risk   3.4   3.3  Average Risk       5.0   4.4  2 X Average Risk   9.6   7.1  3 X Average Risk  23.4   11.0        Use the calculated Patient Ratio above and the CHD Risk Table to determine the patient's CHD Risk.        ATP III CLASSIFICATION (LDL):  <100     mg/dL   Optimal  657-846  mg/dL   Near or Above                    Optimal  130-159  mg/dL   Borderline  962-952  mg/dL   High  >841     mg/dL   Very High Performed at [facility], [address], [place], [place] [address]   TSH     Status: None   Collection Time: 01/05/18  6:28 AM  Result Value Ref Range  TSH 0.757 0.350 - 4.500 uIU/mL    Comment: Performed by a 3rd Generation assay with a functional sensitivity of <=0.01 uIU/mL. Performed at [facility], [address], [place], [place] [address]   Glucose, capillary     Status: Abnormal   Collection Time: 01/05/18 11:58 AM  Result Value Ref Range   Glucose-Capillary 154 (H) 70 - 99 mg/dL  Glucose, capillary     Status: Abnormal   Collection Time: 01/05/18  5:06 PM  Result Value Ref Range   Glucose-Capillary 138 (H) 70 - 99 mg/dL  Glucose, capillary     Status: None   Collection Time: 01/05/18  8:08 PM  Result Value Ref Range   Glucose-Capillary 85 70 - 99 mg/dL  Glucose, capillary     Status: Abnormal   Collection Time: 01/06/18  6:28 AM  Result Value Ref Range   Glucose-Capillary 180 (H) 70 - 99 mg/dL    Blood Alcohol level:  Lab Results  Component Value Date    ETH <10 01/03/2018   ETH <5 04/06/2016    Metabolic Disorder Labs: Lab Results  Component Value Date   HGBA1C 10.3 (H) 01/05/2018   MPG 249 01/05/2018   MPG 275 04/09/2016   Lab Results  Component Value Date   PROLACTIN 13.3 04/09/2016   PROLACTIN 74.0 (H) 06/03/2015   Lab Results  Component Value Date   CHOL 145 01/05/2018   TRIG 72 01/05/2018   HDL 45 01/05/2018   CHOLHDL 3.2 01/05/2018   VLDL 14 01/05/2018   LDLCALC 86 01/05/2018   LDLCALC 81 04/09/2016   Physical Findings: AIMS: Facial and Oral Movements Muscles of Facial Expression: None, normal Lips and Perioral Area: None, normal Jaw: None, normal Tongue: None, normal,Extremity Movements Upper (arms, wrists, hands, fingers): None, normal Lower (legs, knees, ankles, toes): None, normal, Trunk Movements Neck, shoulders, hips: None, normal, Overall Severity Severity of abnormal movements (highest score from questions above): None, normal Incapacitation due to abnormal movements: None, normal Patient's awareness of abnormal movements (rate only patient's report): No Awareness, Dental Status Current problems with teeth and/or dentures?: No Does patient usually wear dentures?: No  CIWA:  CIWA-Ar Total: 1 COWS:  COWS Total Score: 1  Musculoskeletal: Strength & Muscle Tone: within normal limits Gait & Station: normal Patient leans: N/A  Psychiatric Specialty Exam: Physical Exam  Nursing note and vitals reviewed.   Review of Systems  Psychiatric/Behavioral: Positive for hallucinations (Hx. psychosis) and substance abuse (Hx. substance use disorder). Negative for depression, memory loss and suicidal ideas. The patient is not nervous/anxious and does not have insomnia.    no headache, no chest pain, no shortness of breath, no nausea, no vomiting  Blood pressure 129/87, pulse 87, temperature 98 F (36.7 C), temperature source Oral, resp. rate 18, height 6\' 3"  (1.905 m), weight 93.9 kg.Body mass index is 25.87 kg/m.   General Appearance: Improving grooming  Eye Contact:  Improving  Speech:  Normal Rate  Volume:  Normal  Mood:  Reports improving mood  Affect:  Becoming more reactive  Thought Process:  Linear and Descriptions of Associations: Intact-no clear thought disorder noted today  Orientation:  Other:  Fully alert and attentive  Thought Content:  No hallucinations, no delusions, denies hearing voices today and does not appear internally preoccupied  Suicidal Thoughts:  No-denies suicidal or self-injurious ideations  Homicidal Thoughts:  No -denies homicidal ideations  Memory:  Recent and remote grossly intact  Judgement:  Other:  Improving  Insight:  Fair  Psychomotor Activity:  Normal-no psychomotor agitation, no restlessness, no distal tremors noted  Concentration:  Concentration: Good and Attention Span: Good  Recall:  Good  Fund of Knowledge:  Good  Language:  Good  Akathisia:  Negative  Handed:  Right  AIMS (if indicated):     Assets:  Desire for Improvement Resilience  ADL's:  Intact  Cognition:  WNL  Sleep:  Number of Hours: 6.25   Treatment Plan Summary: Daily contact with patient to assess and evaluate symptoms and progress in treatment.  - Continue inpatient hospitalization.  - Will continue today 01/06/2018 plan as below except where it is noted.  Encourage group and milieu participation to work on Pharmacologist and symptom reduction Encourage efforts to work on sobriety and abstinence  Treatment team working on disposition planning  - Continue Abilify 5 mg daily for mood disorder/psychosis  - Continue Neurontin 100 mg 3 times a day for anxiety, agitation/pain  - Continue Trazodone 50 mg nightly PRN for insomnia  - Continue Vistaril 25 mg every 6 hours PRN for anxiety  - Initiated Preparation, apply rectally bid.  - Discontinue Librium 25 mg every 6 hours as needed, no withdrawal symptoms.  - Continue Lantus insulin/NovoLog meal coverage/Glucophage for DM- CBG  today 171.   Armandina Stammer, NP, PMHNP, FNP-BC. 01/06/2018, 11:52 AMPatient ID: [name], male   DOB: [birth year], 47 y.o.   MRN: [id number] .Marland KitchenAgree with NP Progress Note

## 2018-01-06 NOTE — Progress Notes (Signed)
D: Patient is pleasant and visible in the milieu.  He is compliant with his medications.  He denies any auditory/visual hallucinations.  He denies any thought os self harm.  Patient is on CIWA and scores 1.  His goal today is to "get myself together and get myself clean so I can see my daughter."  He denies any depressive symptoms or anxiety.  He denies any hopelessness.  He is in scrubs; his affect is brighter today.  Patient's detox protocol has been discontinued.    A: Continue to monitor medication management and MD orders.  Safety checks completed every 15 minutes per protocol.  Offer support and encouragement as needed.  R: Patient is receptive to staff; his behavior is appropriate.

## 2018-01-06 NOTE — Progress Notes (Signed)
Pt attended AA group this evening.  

## 2018-01-06 NOTE — Progress Notes (Signed)
Recreation Therapy Notes  Date: 10.4.19 Time: 0930 Location: [address] Dayroom  Group Topic: Stress Management  Goal Area(s) Addresses:  Patient will verbalize importance of using healthy stress management.  Patient will identify positive emotions associated with healthy stress management.   Behavioral Response: Engaged  Intervention: Stress Management  Activity :  Patients were introduced to ways in which they can relieve stress.   Education:  Stress Management, Discharge Planning.   Education Outcome: Acknowledges edcuation/In group clarification offered/Needs additional education  Clinical Observations/Feedback: Pt attended group.     Caroll Rancher, LRT/CTRS         Caroll Rancher A 01/06/2018 11:48 AM

## 2018-01-07 DIAGNOSIS — F1210 Cannabis abuse, uncomplicated: Secondary | ICD-10-CM

## 2018-01-07 DIAGNOSIS — F1410 Cocaine abuse, uncomplicated: Secondary | ICD-10-CM

## 2018-01-07 LAB — GLUCOSE, CAPILLARY
Glucose-Capillary: 182 mg/dL — ABNORMAL HIGH (ref 70–99)
Glucose-Capillary: 93 mg/dL (ref 70–99)

## 2018-01-07 MED ORDER — INSULIN ASPART 100 UNIT/ML ~~LOC~~ SOLN: 4.0000 [IU] | Freq: Three times a day (TID) | SUBCUTANEOUS | 1 refills | Status: AC

## 2018-01-07 MED ORDER — INSULIN GLARGINE 100 UNIT/ML ~~LOC~~ SOLN: 40.0000 [IU] | SUBCUTANEOUS | 1 refills | Status: AC

## 2018-01-07 MED ORDER — TRAZODONE HCL 50 MG PO TABS: 50.0000 mg | ORAL_TABLET | Freq: Every evening | ORAL | 0 refills | Status: DC | PRN

## 2018-01-07 MED ORDER — ARIPIPRAZOLE 5 MG PO TABS: 5.0000 mg | ORAL_TABLET | Freq: Every day | ORAL | 0 refills | Status: DC

## 2018-01-07 MED ORDER — METFORMIN HCL 500 MG PO TABS: 500.0000 mg | ORAL_TABLET | Freq: Two times a day (BID) | ORAL | 0 refills | Status: AC

## 2018-01-07 MED ORDER — GABAPENTIN 100 MG PO CAPS: 100.0000 mg | ORAL_CAPSULE | Freq: Three times a day (TID) | ORAL | 0 refills | Status: DC

## 2018-01-07 MED ORDER — HYDROXYZINE HCL 25 MG PO TABS: 25.0000 mg | ORAL_TABLET | Freq: Four times a day (QID) | ORAL | 0 refills | Status: DC | PRN

## 2018-01-07 NOTE — Discharge Summary (Addendum)
Physician Discharge Summary Note  Patient:  Steven Christensen is an 47 y.o., male MRN:  [id number] DOB:  1971/03/07 Patient phone:  [phone] (home)  Patient address:   [address] [place] [address],  Total Time spent with patient: 20 minutes  Date of Admission:  01/03/2018 Date of Discharge: 01/07/18  Reason for Admission:  Worsening depression and anxiety with SI  Principal Problem: Polysubstance (including opioids) dependence, daily use [facility]) Discharge Diagnoses: Patient Active Problem List   Diagnosis Date Noted  . Substance induced mood disorder (HCC) [F19.94] 01/04/2018  . Polysubstance (including opioids) dependence, daily use (HCC) [F11.20, F19.20] 01/04/2018  . MDD (major depressive disorder) [F32.9] 01/03/2018  . Paranoid schizophrenia (HCC) [F20.0] 04/08/2016  . Alcohol use disorder, moderate, dependence (HCC) [F10.20] 04/08/2016  . Cocaine use disorder, moderate, dependence (HCC) [F14.20] 04/08/2016  . Diabetes (HCC) [E11.9] 06/02/2015  . Tobacco use disorder [F17.200] 06/02/2015  . Hemorrhoids [K64.9] 08/02/2013    Past Psychiatric History: Schizophrenia  Past Medical History:  Past Medical History:  Diagnosis Date  . Anxiety   . Bipolar affective (HCC)   . Depression   . Diabetes mellitus   . Schizophrenia (HCC)   . Sleeping difficulties    History reviewed. No pertinent surgical history. Family History:  Family History  Problem Relation Age of Onset  . Diabetes Maternal Aunt   . Mental illness Neg Hx    Family Psychiatric  History: Denies Social History:  Social History   Substance and Sexual Activity  Alcohol Use Yes     Social History   Substance and Sexual Activity  Drug Use Yes  . Types: "Crack" cocaine, Marijuana   Comment: heroine, cocaine    Social History   Socioeconomic History  . Marital status: Single    Spouse name: Not on file  . Number of children: Not on file  . Years of education: Not on file  . Highest  education level: Not on file  Occupational History  . Not on file  Social Needs  . Financial resource strain: Not on file  . Food insecurity:    Worry: Not on file    Inability: Not on file  . Transportation needs:    Medical: Not on file    Non-medical: Not on file  Tobacco Use  . Smoking status: Current Every Day Smoker    Packs/day: 1.00    Years: 35.00    Pack years: 35.00    Types: Cigarettes  . Smokeless tobacco: Never Used  Substance and Sexual Activity  . Alcohol use: Yes  . Drug use: Yes    Types: "Crack" cocaine, Marijuana    Comment: heroine, cocaine  . Sexual activity: Yes    Birth control/protection: None  Lifestyle  . Physical activity:    Days per week: Not on file    Minutes per session: Not on file  . Stress: Not on file  Relationships  . Social connections:    Talks on phone: Not on file    Gets together: Not on file    Attends religious service: Not on file    Active member of club or organization: Not on file    Attends meetings of clubs or organizations: Not on file    Relationship status: Not on file  Other Topics Concern  . Not on file  Social History Narrative  . Not on file    Hospital Course:   01/04/[facility] MD Assessment: 73 year oldAA male with hx of schizophrenia, alcohol,  opioid & cocaine use disorders. He is also noncompliance with his medication for over 12 months. He presented to ED via the EMS with complaints of chest tightness, worsening symptoms of depression & suicidal ideations. He is currently seeking mood stabilization treatment & a referral to a long term substance abuse program after discharge. He is known on this unit/[place] systems from previous hospitalizations. During this assessment, Steven Christensen reports, "The ambulance took me to the ED yesterday evening. My mother called them. I was on heroin & crack. I smoke them. I was told by the EMS that it appeared I was having a heart attack because my eyes were dilated & had stopped  breathing momentarily. My mother called the ambulance because I was not doing good at the time. After smoking crack & heroin, my left arm started hurting & my chest was in pain. I apparently had passed out, that was the reason my mother called the EMS. I have been using drugs heavily x 6 months. I have not taken any of my mental health medicines or my diabetes medicines in about 1 year. I have not seen a mental health provider in a long time. I have been in a mental health institutions/hopsitals in the past including this one. I used to go to [facility] for my medicines.I was on the Abilify injections, probably took it last about a year ago. My diabetes has messed up my eyes. I can hardly see well. I got numbness & tingling sensations to my legs. I will need to get back on my medicines. I am interested in going to a rehab program after discharge. I have had one suicide attempt in the past by overdose. I was sent to [facility] at the time".   Patient remained on the [facility] unit for 3 days. The patient stabilized on medication and therapy. Patient was discharged on Abilify 5 mg Daily, Neurontin 100 mg TID, Vistaril 25 mg Q6H PRN, and Trazodone 50 mg QHS PRN. Patient was also discharged on his diabetic medications of Metformin 500 mg BID, Lantus 40 units QAM, and Novolog 4 units with mealsPatient has shown improvement with improved mood, affect, sleep, appetite, and interaction. Patient has attended group and participated. Patient has been seen in the day room interacting with peers and staff appropriately. Patient denies any SI/HI/AVH and contracts for safety. Patient agrees to follow up at [facility] and [facility]. He also reports that he has a provider managing his diabetes that has an office off of [place], but doesn't remember the name.  Patient is provided with prescriptions for their medications upon discharge.   Physical Findings: AIMS: Facial and Oral Movements Muscles of Facial Expression: None,  normal Lips and Perioral Area: None, normal Jaw: None, normal Tongue: None, normal,Extremity Movements Upper (arms, wrists, hands, fingers): None, normal Lower (legs, knees, ankles, toes): None, normal, Trunk Movements Neck, shoulders, hips: None, normal, Overall Severity Severity of abnormal movements (highest score from questions above): None, normal Incapacitation due to abnormal movements: None, normal Patient's awareness of abnormal movements (rate only patient's report): No Awareness, Dental Status Current problems with teeth and/or dentures?: No Does patient usually wear dentures?: No  CIWA:  CIWA-Ar Total: 0 COWS:  COWS Total Score: 1  Musculoskeletal: Strength & Muscle Tone: within normal limits Gait & Station: normal Patient leans: N/A  Psychiatric Specialty Exam: Physical Exam  Nursing note and vitals reviewed. Constitutional: He is oriented to person, place, and time. He appears well-developed and well-nourished.  Cardiovascular: Normal rate.  Respiratory: Effort normal.  Musculoskeletal: Normal range of motion.  Neurological: He is alert and oriented to person, place, and time.  Skin: Skin is warm.    Review of Systems  Constitutional: Negative.   HENT: Negative.   Eyes: Negative.   Respiratory: Negative.   Cardiovascular: Negative.   Gastrointestinal: Negative.   Genitourinary: Negative.   Musculoskeletal: Negative.   Skin: Negative.   Neurological: Negative.   Endo/Heme/Allergies: Negative.   Psychiatric/Behavioral: Negative.     Blood pressure (!) 156/92, pulse 74, temperature 97.6 F (36.4 C), resp. rate 18, height 6\' 3"  (1.905 m), weight 93.9 kg.Body mass index is 25.87 kg/m.  General Appearance: Casual  Eye Contact:  Good  Speech:  Clear and Coherent and Normal Rate  Volume:  Normal  Mood:  Euthymic  Affect:  Congruent  Thought Process:  Goal Directed and Descriptions of Associations: Intact  Orientation:  Full (Time, Place, and Person)   Thought Content:  WDL  Suicidal Thoughts:  No  Homicidal Thoughts:  No  Memory:  Immediate;   Good Recent;   Good Remote;   Good  Judgement:  Fair  Insight:  Fair  Psychomotor Activity:  Normal  Concentration:  Concentration: Good and Attention Span: Good  Recall:  Good  Fund of Knowledge:  Good  Language:  Good  Akathisia:  No  Handed:  Right  AIMS (if indicated):     Assets:  Communication Skills Desire for Improvement Financial Resources/Insurance Housing Physical Health Social Support Transportation  ADL's:  Intact  Cognition:  WNL  Sleep:  Number of Hours: 5.75     Have you used any form of tobacco in the last 30 days? (Cigarettes, Smokeless Tobacco, Cigars, and/or Pipes): Yes  Has this patient used any form of tobacco in the last 30 days? (Cigarettes, Smokeless Tobacco, Cigars, and/or Pipes) Yes, Yes, A prescription for an FDA-approved tobacco cessation medication was offered at discharge and the patient refused  Blood Alcohol level:  Lab Results  Component Value Date   [facility] <10 01/03/2018   ETH <5 04/06/2016    Metabolic Disorder Labs:  Lab Results  Component Value Date   HGBA1C 10.3 (H) 01/05/2018   MPG 249 01/05/2018   MPG 275 04/09/2016   Lab Results  Component Value Date   PROLACTIN 13.3 04/09/2016   PROLACTIN 74.0 (H) 06/03/2015   Lab Results  Component Value Date   CHOL 145 01/05/2018   TRIG 72 01/05/2018   HDL 45 01/05/2018   CHOLHDL 3.2 01/05/2018   VLDL 14 01/05/2018   LDLCALC 86 01/05/2018   LDLCALC 81 04/09/2016    See Psychiatric Specialty Exam and Suicide Risk Assessment completed by Attending Physician prior to discharge.  Discharge destination:  Home  Is patient on multiple antipsychotic therapies at discharge:  No   Has Patient had three or more failed trials of antipsychotic monotherapy by history:  No  Recommended Plan for Multiple Antipsychotic Therapies: NA   Allergies as of 01/07/2018   No Known Allergies      Medication List    STOP taking these medications   nicotine 21 mg/24hr patch Commonly known as:  NICODERM CQ - dosed in mg/24 hours     TAKE these medications     Indication  ARIPiprazole 5 MG tablet Commonly known as:  ABILIFY Take 1 tablet (5 mg total) by mouth daily. for mood control Start taking on:  01/08/2018 What changed:    medication strength  how much to take  additional instructions  Another medication with the same name was removed. Continue taking this medication, and follow the directions you see here.  Indication:  mood stability   gabapentin 100 MG capsule Commonly known as:  NEURONTIN Take 1 capsule (100 mg total) by mouth 3 (three) times daily. What changed:    medication strength  how much to take  Indication:  Neuropathic Pain   hydrOXYzine 25 MG tablet Commonly known as:  ATARAX/VISTARIL Take 1 tablet (25 mg total) by mouth every 6 (six) hours as needed for anxiety.  Indication:  Feeling Anxious   insulin aspart 100 UNIT/ML injection Commonly known as:  novoLOG Inject 4 Units into the skin 3 (three) times daily with meals.  Indication:  Type 2 Diabetes   insulin glargine 100 UNIT/ML injection Commonly known as:  LANTUS Inject 0.4 mLs (40 Units total) into the skin every morning. Start taking on:  01/08/2018  Indication:  Type 2 Diabetes   metFORMIN 500 MG tablet Commonly known as:  GLUCOPHAGE Take 1 tablet (500 mg total) by mouth 2 (two) times daily with a meal.  Indication:  Type 2 Diabetes   traZODone 50 MG tablet Commonly known as:  DESYREL Take 1 tablet (50 mg total) by mouth at bedtime as needed for sleep. What changed:    medication strength  how much to take  when to take this  reasons to take this  Indication:  Trouble Sleeping      Follow-up Information    [facility]. Go on 01/13/2018.   Specialty:  Behavioral Health Why:  Appointment for medication management and therapy services is Friday, 01/13/18 at 8:00am.  Please be sure to bring your Photo ID, SSN, any insurance information and any discharge paperwork from this hospitalization Contact information: [address] [place] [address] [phone]        Services, [facility] Follow up.   Contact information: [address] [place] [address] [phone]           Follow-up recommendations:  Continue activity as tolerated. Continue diet as recommended by your PCP. Ensure to keep all appointments with outpatient providers.  Comments:  Patient is instructed prior to discharge to: Take all medications as prescribed by his/her mental healthcare provider. Report any adverse effects and or reactions from the medicines to his/her outpatient provider promptly. Patient has been instructed & cautioned: To not engage in alcohol and or illegal drug use while on prescription medicines. In the event of worsening symptoms, patient is instructed to call the crisis hotline, 911 and or go to the nearest ED for appropriate evaluation and treatment of symptoms. To follow-up with his/her primary care provider for your other medical issues, concerns and or health care needs.    Signed: Gerlene Burdock Money, FNP 01/07/2018, 8:42 AM   Patient seen, Suicide Assessment Completed.  Disposition Plan Reviewed

## 2018-01-07 NOTE — Progress Notes (Signed)
  [facility] Adult Case Management Discharge Plan :  Will you be returning to the same living situation after discharge:  Yes,  with mother At discharge, do you have transportation home?: Yes,  arranged by patient Do you have the ability to pay for your medications: No.  Will need help from outpatient provider  Release of information consent forms completed and turned in to Medical Records by CSW.   Patient to Follow up at: Follow-up Information    [facility]. Go on 01/13/2018.   Specialty:  Behavioral Health Why:  Appointment for medication management and therapy services is Friday, 01/13/18 at 8:00am. Please be sure to bring your Photo ID, SSN, any insurance information and any discharge paperwork from this hospitalization Contact information: [address] [place] [address] [phone]           Next level of care provider has access to [facility] Link:no  Safety Planning and Suicide Prevention discussed: Yes,  with patient only  Have you used any form of tobacco in the last 30 days? (Cigarettes, Smokeless Tobacco, Cigars, and/or Pipes): Yes  Has patient been referred to the Quitline?: Patient refused referral  Patient has been referred for addiction treatment: Pt. refused referral  Lynnell Chad, LCSW 01/07/2018, 11:26 AM

## 2018-01-07 NOTE — BHH Suicide Risk Assessment (Addendum)
[facility] Discharge Suicide Risk Assessment   Principal Problem: Polysubstance (including opioids) dependence, daily use [facility]) Discharge Diagnoses:  Patient Active Problem List   Diagnosis Date Noted  . Substance induced mood disorder (HCC) [F19.94] 01/04/2018  . Polysubstance (including opioids) dependence, daily use (HCC) [F11.20, F19.20] 01/04/2018  . MDD (major depressive disorder) [F32.9] 01/03/2018  . Paranoid schizophrenia (HCC) [F20.0] 04/08/2016  . Alcohol use disorder, moderate, dependence (HCC) [F10.20] 04/08/2016  . Cocaine use disorder, moderate, dependence (HCC) [F14.20] 04/08/2016  . Diabetes (HCC) [E11.9] 06/02/2015  . Tobacco use disorder [F17.200] 06/02/2015  . Hemorrhoids [K64.9] 08/02/2013    Total Time spent with patient: 30 minutes  Musculoskeletal: Strength & Muscle Tone: within normal limits Gait & Station: normal Patient leans: N/A  Psychiatric Specialty Exam: ROS denies headache, denies chest pain, no shortness of breath, no vomiting , describes some loose stools and flatulence, which he attributes to " the food here", no abdominal pain.   Blood pressure (!) 156/92, pulse 74, temperature 97.6 F (36.4 C), resp. rate 18, height 6\' 3"  (1.905 m), weight 93.9 kg.Body mass index is 25.87 kg/m.  General Appearance: improving grooming   Eye Contact::  Good  Speech:  Normal Rate409  Volume:  Normal  Mood:  reports mood is " better"  Affect:  has become more reactive, smiles at times appropriately  Thought Process:  Linear and Descriptions of Associations: Intact  Orientation:  Full (Time, Place, and Person)  Thought Content:  denies hallucinations, no delusions currently expressed, does not appear internally preoccupied or guarded   Suicidal Thoughts:  No denies suicidal or self injurious ideations, denies homicidal or violent ideations  Homicidal Thoughts:  No  Memory:  recent and remote grossly intact   Judgement:  Other:  improving   Insight:  Fair/mproving    Psychomotor Activity:  Normal  Concentration:  Good  Recall:  Good  Fund of Knowledge:Good  Language: Good  Akathisia:  Negative  Handed:  Right  AIMS (if indicated):     Assets:  Communication Skills Desire for Improvement Resilience  Sleep:  Number of Hours: 5.75  Cognition: WNL  ADL's:  Intact   Mental Status Per Nursing Assessment::   On Admission:  Self-harm thoughts  Demographic Factors:  63, male, single, unemployed   Loss Factors: Unemployment, substance abuse   Historical Factors: History of past diagnosis of Schizophrenia, history of chronic hallucinations, history of a prior suicidal attempt by overdose, reports history of alcohol/cocaine use disorder   Risk Reduction Factors:   Positive coping skills or problem solving skills  Continued Clinical Symptoms:  At this time patient reports improving mood , reports feeling " a lot better" than he did on admission. Mood improved , affect fuller in range, denies hallucinations, no delusions expressed, does not appear internally preoccupied, denies SI or HI, future oriented . Denies medication side effects. No disruptive or agitated behaviors on unit, pleasant on approach.  Cognitive Features That Contribute To Risk:  No gross cognitive deficits noted upon discharge. Is alert , attentive, and oriented x 3   Suicide Risk:  Mild:  Suicidal ideation of limited frequency, intensity, duration, and specificity.  There are no identifiable plans, no associated intent, mild dysphoria and related symptoms, good self-control (both objective and subjective assessment), few other risk factors, and identifiable protective factors, including available and accessible social support.  Follow-up Information    [facility]. Go on 01/13/2018.   Specialty:  Behavioral Health Why:  Appointment for medication management and therapy services is  Friday, 01/13/18 at 8:00am. Please be sure to bring your Photo ID, SSN, any insurance information and  any discharge paperwork from this hospitalization Contact information: [address] [place] [address] [phone]        Services, [facility] Follow up.   Contact information: [address] [place] [address] [phone]           Plan Of Care/Follow-up recommendations:  Activity:  as tolerated  Diet:  Heart healthy, Diabetic diet  Tests:  NA Other:  see below  Patient is expressing readiness for discharge and is leaving unit in good spirits . States he has spoken with mother and  will be able to return to live with her at this time, plans to look into [facility] as a longer term housing option.  Reports he has an established PCP for medical management- Dr. Jacqlyn Larsen.  Craige Cotta, MD 01/07/2018, 9:54 AM

## 2018-01-07 NOTE — BHH Group Notes (Signed)
LCSW Group Therapy Note  01/07/2018   10:00-11:00am   Type of Therapy and Topic:  Group Therapy: Anger Cues and Responses  Participation Level:  Did Not Attend   Description of Group:   In this group, patients learned how to recognize the physical, cognitive, emotional, and behavioral responses they have to anger-provoking situations.  They identified a recent time they became angry and how they reacted.  They analyzed how their reaction was possibly beneficial and how it was possibly unhelpful.  The group discussed a variety of healthier coping skills that could help with such a situation in the future.  Deep breathing was practiced briefly.  Therapeutic Goals: 1. Patients will remember their last incident of anger and how they felt emotionally and physically, what their thoughts were at the time, and how they behaved. 2. Patients will identify how their behavior at that time worked for them, as well as how it worked against them. 3. Patients will explore possible new behaviors to use in future anger situations. 4. Patients will learn that anger itself is normal and cannot be eliminated, and that healthier reactions can assist with resolving conflict rather than worsening situations.  Summary of Patient Progress:  N/A  Therapeutic Modalities:   Cognitive Behavioral Therapy  Rukaya Kleinschmidt J Grossman-Orr     

## 2018-01-07 NOTE — Plan of Care (Addendum)
D: Pt A & O X 3. Denies SI, HI, AVH and pain at this time. His sleep is good and did not request medication. His appetite is good, energy high, and concentration good. He rates his depression, feeling of hopelessness, and anxiety 0/10. He denies physical complaints or withdrawal symptoms. D/C home as ordered. Picked up in lobby by mother. Plans to follow up with residential rehabilitation. A: D/C instructions reviewed with pt including prescriptions, medication samples and follow up appointment, compliance encouraged. All belongings from locker #21 given to pt at time of departure. Scheduled and PRN medications given with verbal education and effects monitored. Safety checks maintained without incident till time of d/c.  R: Pt receptive to care. Compliant with medications when offered. Denies adverse drug reactions when assessed. Verbalized understanding related to d/c instructions. Signed belonging sheet in agreement with items received from locker. Ambulatory with a steady gait. Appears to be in no physical distress at time of departure. Goal: "Getting my life together and getting off drugs"

## 2018-04-05 ENCOUNTER — Inpatient Hospital Stay (HOSPITAL_COMMUNITY)
Admission: AD | Admit: 2018-04-05 | Discharge: 2018-04-10 | DRG: 897 | Disposition: A | Discharge status: Home / Self Care | Payer: Medicaid Other | Source: Intra-hospital | Attending: Psychiatry | Admitting: Psychiatry

## 2018-04-05 ENCOUNTER — Encounter (HOSPITAL_COMMUNITY): Payer: Self-pay | Admitting: Behavioral Health

## 2018-04-05 ENCOUNTER — Emergency Department (HOSPITAL_COMMUNITY)
Admission: EM | Admit: 2018-04-05 | Discharge: 2018-04-05 | Disposition: A | Discharge status: Home / Self Care | Payer: Medicaid Other | Attending: Emergency Medicine | Admitting: Emergency Medicine

## 2018-04-05 ENCOUNTER — Encounter (HOSPITAL_COMMUNITY): Payer: Self-pay

## 2018-04-05 ENCOUNTER — Other Ambulatory Visit: Payer: Self-pay

## 2018-04-05 DIAGNOSIS — F1014 Alcohol abuse with alcohol-induced mood disorder: Secondary | ICD-10-CM | POA: Diagnosis not present

## 2018-04-05 DIAGNOSIS — F17210 Nicotine dependence, cigarettes, uncomplicated: Secondary | ICD-10-CM | POA: Diagnosis present

## 2018-04-05 DIAGNOSIS — F1420 Cocaine dependence, uncomplicated: Secondary | ICD-10-CM | POA: Insufficient documentation

## 2018-04-05 DIAGNOSIS — F200 Paranoid schizophrenia: Secondary | ICD-10-CM | POA: Diagnosis present

## 2018-04-05 DIAGNOSIS — F1024 Alcohol dependence with alcohol-induced mood disorder: Secondary | ICD-10-CM | POA: Diagnosis present

## 2018-04-05 DIAGNOSIS — R443 Hallucinations, unspecified: Secondary | ICD-10-CM | POA: Insufficient documentation

## 2018-04-05 DIAGNOSIS — F1020 Alcohol dependence, uncomplicated: Secondary | ICD-10-CM | POA: Insufficient documentation

## 2018-04-05 DIAGNOSIS — R45851 Suicidal ideations: Secondary | ICD-10-CM | POA: Insufficient documentation

## 2018-04-05 DIAGNOSIS — Z833 Family history of diabetes mellitus: Secondary | ICD-10-CM

## 2018-04-05 DIAGNOSIS — E119 Type 2 diabetes mellitus without complications: Secondary | ICD-10-CM | POA: Diagnosis present

## 2018-04-05 DIAGNOSIS — Y903 Blood alcohol level of 60-79 mg/100 ml: Secondary | ICD-10-CM | POA: Diagnosis present

## 2018-04-05 DIAGNOSIS — F1410 Cocaine abuse, uncomplicated: Secondary | ICD-10-CM | POA: Diagnosis present

## 2018-04-05 DIAGNOSIS — Z62810 Personal history of physical and sexual abuse in childhood: Secondary | ICD-10-CM | POA: Diagnosis present

## 2018-04-05 DIAGNOSIS — F259 Schizoaffective disorder, unspecified: Secondary | ICD-10-CM | POA: Diagnosis present

## 2018-04-05 DIAGNOSIS — F419 Anxiety disorder, unspecified: Secondary | ICD-10-CM | POA: Diagnosis present

## 2018-04-05 DIAGNOSIS — F329 Major depressive disorder, single episode, unspecified: Secondary | ICD-10-CM | POA: Insufficient documentation

## 2018-04-05 DIAGNOSIS — F332 Major depressive disorder, recurrent severe without psychotic features: Secondary | ICD-10-CM | POA: Diagnosis not present

## 2018-04-05 DIAGNOSIS — F209 Schizophrenia, unspecified: Secondary | ICD-10-CM | POA: Insufficient documentation

## 2018-04-05 HISTORY — DX: Type 2 diabetes mellitus without complications: E11.9

## 2018-04-05 LAB — CBC WITH DIFFERENTIAL/PLATELET
Abs Immature Granulocytes: 0.03 10*3/uL (ref 0.00–0.07)
BASOS ABS: 0 10*3/uL (ref 0.0–0.1)
BASOS PCT: 0 %
EOS ABS: 0 10*3/uL (ref 0.0–0.5)
EOS PCT: 0 %
HCT: 46.8 % (ref 39.0–52.0)
Hemoglobin: 15.2 g/dL (ref 13.0–17.0)
Immature Granulocytes: 0 %
LYMPHS PCT: 22 %
Lymphs Abs: 2.4 10*3/uL (ref 0.7–4.0)
MCH: 30.5 pg (ref 26.0–34.0)
MCHC: 32.5 g/dL (ref 30.0–36.0)
MCV: 93.8 fL (ref 80.0–100.0)
Monocytes Absolute: 0.8 10*3/uL (ref 0.1–1.0)
Monocytes Relative: 7 %
NRBC: 0 % (ref 0.0–0.2)
Neutro Abs: 7.6 10*3/uL (ref 1.7–7.7)
Neutrophils Relative %: 71 %
Platelets: 176 10*3/uL (ref 150–400)
RBC: 4.99 MIL/uL (ref 4.22–5.81)
RDW: 13.8 % (ref 11.5–15.5)
WBC: 11 10*3/uL — AB (ref 4.0–10.5)

## 2018-04-05 LAB — COMPREHENSIVE METABOLIC PANEL
ALBUMIN: 4.6 g/dL (ref 3.5–5.0)
ALT: 18 U/L (ref 0–44)
ANION GAP: 15 (ref 5–15)
AST: 19 U/L (ref 15–41)
Alkaline Phosphatase: 75 U/L (ref 38–126)
BUN: 11 mg/dL (ref 6–20)
CO2: 21 mmol/L — AB (ref 22–32)
Calcium: 8.8 mg/dL — ABNORMAL LOW (ref 8.9–10.3)
Chloride: 103 mmol/L (ref 98–111)
Creatinine, Ser: 0.68 mg/dL (ref 0.61–1.24)
GFR calc non Af Amer: >60 mL/min (ref >60)
GLUCOSE: 223 mg/dL — AB (ref 70–99)
POTASSIUM: 3.8 mmol/L (ref 3.5–5.1)
SODIUM: 139 mmol/L (ref 135–145)
TOTAL PROTEIN: 7.4 g/dL (ref 6.5–8.1)
Total Bilirubin: 0.4 mg/dL (ref 0.3–1.2)

## 2018-04-05 LAB — GLUCOSE, CAPILLARY
Glucose-Capillary: 228 mg/dL — ABNORMAL HIGH (ref 70–99)
Glucose-Capillary: 178 mg/dL — ABNORMAL HIGH (ref 70–99)

## 2018-04-05 LAB — ETHANOL: Alcohol, Ethyl (B): 72 mg/dL — ABNORMAL HIGH (ref <10)

## 2018-04-05 LAB — CBG MONITORING, ED: Glucose-Capillary: 207 mg/dL — ABNORMAL HIGH (ref 70–99)

## 2018-04-05 MED ORDER — INSULIN ASPART 100 UNIT/ML ~~LOC~~ SOLN
4.0000 [IU] | Freq: Three times a day (TID) | SUBCUTANEOUS | Status: DC
  Administered 2018-04-05 – 2018-04-10 (×15): 4 [IU] via SUBCUTANEOUS
  Filled 2018-04-05: qty 0.04

## 2018-04-05 MED ORDER — HYDROXYZINE HCL 25 MG PO TABS
25.0000 mg | ORAL_TABLET | Freq: Four times a day (QID) | ORAL | Status: DC | PRN
  Administered 2018-04-05: 25 mg via ORAL
  Filled 2018-04-05 (×2): qty 10

## 2018-04-05 MED ORDER — TRAZODONE HCL 50 MG PO TABS: 50.0000 mg | ORAL_TABLET | Freq: Every evening | ORAL | Status: DC | PRN

## 2018-04-05 MED ORDER — INSULIN GLARGINE 100 UNIT/ML ~~LOC~~ SOLN
40.0000 [IU] | Freq: Every day | SUBCUTANEOUS | Status: DC
  Filled 2018-04-05: qty 0.4

## 2018-04-05 MED ORDER — INSULIN GLARGINE 100 UNIT/ML ~~LOC~~ SOLN
40.0000 [IU] | Freq: Every day | SUBCUTANEOUS | Status: DC
  Administered 2018-04-06 – 2018-04-08 (×3): 40 [IU] via SUBCUTANEOUS

## 2018-04-05 MED ORDER — ALUM & MAG HYDROXIDE-SIMETH 200-200-20 MG/5ML PO SUSP
30.0000 mL | ORAL | Status: DC | PRN
  Administered 2018-04-08 – 2018-04-09 (×2): 30 mL via ORAL
  Filled 2018-04-05 (×2): qty 30

## 2018-04-05 MED ORDER — TRAZODONE HCL 50 MG PO TABS
50.0000 mg | ORAL_TABLET | Freq: Every evening | ORAL | Status: DC | PRN
  Administered 2018-04-05 – 2018-04-09 (×4): 50 mg via ORAL
  Filled 2018-04-05: qty 1
  Filled 2018-04-05: qty 7
  Filled 2018-04-05: qty 10
  Filled 2018-04-05 (×3): qty 1

## 2018-04-05 MED ORDER — ENSURE ENLIVE PO LIQD
237.0000 mL | Freq: Two times a day (BID) | ORAL | Status: DC
  Administered 2018-04-06 – 2018-04-07 (×3): 237 mL via ORAL

## 2018-04-05 MED ORDER — HYDROXYZINE HCL 25 MG PO TABS: 25.0000 mg | ORAL_TABLET | Freq: Four times a day (QID) | ORAL | Status: DC | PRN

## 2018-04-05 MED ORDER — INSULIN ASPART 100 UNIT/ML ~~LOC~~ SOLN: 4.0000 [IU] | Freq: Three times a day (TID) | SUBCUTANEOUS | Status: DC

## 2018-04-05 MED ORDER — ARIPIPRAZOLE 5 MG PO TABS
5.0000 mg | ORAL_TABLET | Freq: Every day | ORAL | Status: DC
  Administered 2018-04-05 – 2018-04-06 (×2): 5 mg via ORAL
  Filled 2018-04-05 (×4): qty 1

## 2018-04-05 MED ORDER — ARIPIPRAZOLE 5 MG PO TABS: 5.0000 mg | ORAL_TABLET | Freq: Every day | ORAL | Status: DC

## 2018-04-05 MED ORDER — MAGNESIUM HYDROXIDE 400 MG/5ML PO SUSP
30.0000 mL | Freq: Every day | ORAL | Status: DC | PRN
  Administered 2018-04-10: 30 mL via ORAL
  Filled 2018-04-05: qty 30

## 2018-04-05 MED ORDER — METFORMIN HCL 500 MG PO TABS: 500.0000 mg | ORAL_TABLET | Freq: Two times a day (BID) | ORAL | Status: DC

## 2018-04-05 MED ORDER — METFORMIN HCL 500 MG PO TABS
500.0000 mg | ORAL_TABLET | Freq: Two times a day (BID) | ORAL | Status: DC
  Administered 2018-04-05 – 2018-04-08 (×6): 500 mg via ORAL
  Filled 2018-04-05 (×9): qty 1

## 2018-04-05 MED ORDER — GABAPENTIN 100 MG PO CAPS
100.0000 mg | ORAL_CAPSULE | Freq: Three times a day (TID) | ORAL | Status: DC
  Administered 2018-04-05 – 2018-04-10 (×15): 100 mg via ORAL
  Filled 2018-04-05 (×24): qty 1

## 2018-04-05 MED ORDER — ACETAMINOPHEN 325 MG PO TABS
650.0000 mg | ORAL_TABLET | Freq: Four times a day (QID) | ORAL | Status: DC | PRN
  Administered 2018-04-05 – 2018-04-10 (×3): 650 mg via ORAL
  Filled 2018-04-05 (×3): qty 2

## 2018-04-05 MED ORDER — ALUM & MAG HYDROXIDE-SIMETH 200-200-20 MG/5ML PO SUSP
30.0000 mL | Freq: Once | ORAL | Status: AC
  Administered 2018-04-05: 30 mL via ORAL
  Filled 2018-04-05: qty 30

## 2018-04-05 MED ORDER — GABAPENTIN 100 MG PO CAPS: 100.0000 mg | ORAL_CAPSULE | Freq: Three times a day (TID) | ORAL | Status: DC

## 2018-04-05 NOTE — Tx Team (Signed)
Initial Treatment Plan 04/05/2018 4:16 PM [name] [id number]    PATIENT STRESSORS: Substance abuse Traumatic event   PATIENT STRENGTHS: Ability for insight Motivation for treatment/growth   PATIENT IDENTIFIED PROBLEMS: "paranoia, someone is trying to kill  me".  "daily alcohol use".  "voices telling me to kill myself"                 DISCHARGE CRITERIA:  Ability to meet basic life and health needs Adequate post-discharge living arrangements Improved stabilization in mood, thinking, and/or behavior  PRELIMINARY DISCHARGE PLAN: Attend aftercare/continuing care group Attend PHP/IOP  PATIENT/FAMILY INVOLVEMENT: This treatment plan has been presented to and reviewed with the patient, Steven Christensen, and/or family member.  The patient and family have been given the opportunity to ask questions and make suggestions.  Layla Barter, RN 04/05/2018, 4:16 PM

## 2018-04-05 NOTE — ED Notes (Signed)
Bed: ZO10WA05 Expected date:  Expected time:  Means of arrival:  Comments: EMS/S.I. HTN

## 2018-04-05 NOTE — Progress Notes (Signed)
[facility] Pt reported having fleeting suicidal thoughts to shoot himself with a gun. Pt stated that he doesn't want to live anymore because he's a "nobody and is ugly". Pt reported having a gun in his bedroom but believes his mother took the gun out of his room. Pt endorses AH telling him to kill himself and that he's going to die. Pt expressed feeling paranoid, like people are out to kill him. Pt appeared frightened on admission and began to cry. Pt reported drinking a 12 pack of beer daily along with Tequila. Pt reported using a $100 dollars worth of cocaine every other week. Pt admitted to the unit and report given to NIKEMichael RN.

## 2018-04-05 NOTE — ED Triage Notes (Signed)
Pt BIB EMS from home.  Pt reports auditory hallucinations (hears voices, believes gang members are going to kill him if he doesn't give them money), admitted to smoking crack this AM and ETOH abuse (denies drinking this AM).  SI plan this AM was "to shoot himself, but couldn't find one."  EMS reports pt was nonaggressive with EMS but repetitively makes SI/HI threats. Pt hx of bipolar, pschizophrenic, currently off meds.
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
## 2018-04-05 NOTE — Progress Notes (Signed)
Nursing Progress Note: 7p-7a D: Pt currently presents with a depressed/anxious affect and behavior. Pt states "I feel better with the voices. My head just hurts bad." Interacting appropriately with the milieu. Pt reports good sleep during the previous night with current medication regimen.   A: Pt provided with medications per providers orders. Pt's labs and vitals were monitored throughout the night. Pt supported emotionally and encouraged to express concerns and questions. Pt educated on medications.  R: Pt's safety ensured with 15 minute and environmental checks. Pt currently denies SI, HI, and AVH. Pt verbally contracts to seek staff if SI,HI, or AVH occurs and to consult with staff before acting on any harmful thoughts. Will continue to monitor.
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
## 2018-04-06 DIAGNOSIS — R45851 Suicidal ideations: Secondary | ICD-10-CM

## 2018-04-06 DIAGNOSIS — F1014 Alcohol abuse with alcohol-induced mood disorder: Secondary | ICD-10-CM

## 2018-04-06 LAB — GLUCOSE, CAPILLARY
Glucose-Capillary: 199 mg/dL — ABNORMAL HIGH (ref 70–99)
Glucose-Capillary: 188 mg/dL — ABNORMAL HIGH (ref 70–99)
Glucose-Capillary: 265 mg/dL — ABNORMAL HIGH (ref 70–99)
Glucose-Capillary: 270 mg/dL — ABNORMAL HIGH (ref 70–99)

## 2018-04-06 LAB — TSH: TSH: 0.307 u[IU]/mL — ABNORMAL LOW (ref 0.350–4.500)

## 2018-04-06 MED ORDER — CHLORDIAZEPOXIDE HCL 25 MG PO CAPS
25.0000 mg | ORAL_CAPSULE | Freq: Four times a day (QID) | ORAL | Status: AC
  Administered 2018-04-06 (×2): 25 mg via ORAL
  Filled 2018-04-06 (×2): qty 1

## 2018-04-06 MED ORDER — ONDANSETRON 4 MG PO TBDP: 4.0000 mg | ORAL_TABLET | Freq: Four times a day (QID) | ORAL | Status: AC | PRN

## 2018-04-06 MED ORDER — AMANTADINE HCL 100 MG PO CAPS
100.0000 mg | ORAL_CAPSULE | Freq: Two times a day (BID) | ORAL | Status: DC
  Administered 2018-04-06 – 2018-04-10 (×9): 100 mg via ORAL
  Filled 2018-04-06: qty 1
  Filled 2018-04-06 (×2): qty 14
  Filled 2018-04-06 (×10): qty 1

## 2018-04-06 MED ORDER — CHLORDIAZEPOXIDE HCL 25 MG PO CAPS
25.0000 mg | ORAL_CAPSULE | Freq: Every day | ORAL | Status: AC
  Administered 2018-04-09: 25 mg via ORAL
  Filled 2018-04-06: qty 1

## 2018-04-06 MED ORDER — CHLORDIAZEPOXIDE HCL 25 MG PO CAPS
50.0000 mg | ORAL_CAPSULE | Freq: Once | ORAL | Status: AC
  Administered 2018-04-06: 50 mg via ORAL
  Filled 2018-04-06: qty 2

## 2018-04-06 MED ORDER — VITAMIN B-1 100 MG PO TABS
100.0000 mg | ORAL_TABLET | Freq: Every day | ORAL | Status: DC
  Administered 2018-04-07 – 2018-04-10 (×4): 100 mg via ORAL
  Filled 2018-04-06 (×7): qty 1

## 2018-04-06 MED ORDER — THIAMINE HCL 100 MG/ML IJ SOLN
100.0000 mg | Freq: Once | INTRAMUSCULAR | Status: AC
  Administered 2018-04-06: 100 mg via INTRAMUSCULAR
  Filled 2018-04-06: qty 2

## 2018-04-06 MED ORDER — CHLORDIAZEPOXIDE HCL 25 MG PO CAPS
25.0000 mg | ORAL_CAPSULE | Freq: Four times a day (QID) | ORAL | Status: DC | PRN
  Administered 2018-04-07: 25 mg via ORAL
  Filled 2018-04-06: qty 1

## 2018-04-06 MED ORDER — ADULT MULTIVITAMIN W/MINERALS CH
1.0000 | ORAL_TABLET | Freq: Every day | ORAL | Status: DC
  Administered 2018-04-06 – 2018-04-10 (×5): 1 via ORAL
  Filled 2018-04-06 (×8): qty 1

## 2018-04-06 MED ORDER — ONDANSETRON 4 MG PO TBDP
4.0000 mg | ORAL_TABLET | Freq: Once | ORAL | Status: AC
  Administered 2018-04-06: 4 mg via ORAL
  Filled 2018-04-06 (×2): qty 1

## 2018-04-06 MED ORDER — LOPERAMIDE HCL 2 MG PO CAPS
2.0000 mg | ORAL_CAPSULE | ORAL | Status: AC | PRN
  Administered 2018-04-06: 4 mg via ORAL
  Filled 2018-04-06: qty 2

## 2018-04-06 MED ORDER — RISPERIDONE 2 MG PO TABS
2.0000 mg | ORAL_TABLET | Freq: Two times a day (BID) | ORAL | Status: DC
  Administered 2018-04-06 – 2018-04-08 (×5): 2 mg via ORAL
  Filled 2018-04-06 (×8): qty 1

## 2018-04-06 MED ORDER — CHLORDIAZEPOXIDE HCL 25 MG PO CAPS
25.0000 mg | ORAL_CAPSULE | Freq: Three times a day (TID) | ORAL | Status: AC
  Administered 2018-04-07 (×3): 25 mg via ORAL
  Filled 2018-04-06 (×3): qty 1

## 2018-04-06 MED ORDER — CHLORDIAZEPOXIDE HCL 25 MG PO CAPS
25.0000 mg | ORAL_CAPSULE | ORAL | Status: AC
  Administered 2018-04-08 (×2): 25 mg via ORAL
  Filled 2018-04-06 (×2): qty 1

## 2018-04-06 MED ORDER — CLONIDINE HCL 0.2 MG PO TABS
0.2000 mg | ORAL_TABLET | Freq: Two times a day (BID) | ORAL | Status: DC
  Administered 2018-04-06 – 2018-04-10 (×9): 0.2 mg via ORAL
  Filled 2018-04-06 (×6): qty 1
  Filled 2018-04-06: qty 2
  Filled 2018-04-06 (×6): qty 1
  Filled 2018-04-06: qty 2
  Filled 2018-04-06: qty 1

## 2018-04-06 NOTE — [facility] Group Notes (Signed)
[facility] Group Therapy 04/06/2018 2:04 PM  Type of Therapy: [facility] Presentation  Participation Level: Did not attend.   Enid Cutter, MSW, [facility] 04/06/2018 2:04 PM
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
## 2018-04-06 NOTE — Progress Notes (Signed)
Patient ID: Steven Christensen, male   DOB: 15-Jan-1971, 48 y.o.   MRN: [id number] D: Patient lying in bed asleep on approach. Pt reports feeling dizzy and new onset diarrhea.  Pt mood and affect appeared depressed and flat. Pt able to go to group and complaint with scheduled and PRN medication. Denies SI/HI/AVH and pain.No behavioral issues noted.  A: Support and encouragement offered as needed to express needs. Medications administered as prescribed.  R: Patient is safe and cooperative on unit. Will continue to monitor  for safety and stability.
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
## 2018-04-06 NOTE — Progress Notes (Signed)
Adult Psychoeducational Group Note  Date:  04/06/2018 Time:  9:16 PM  Group Topic/Focus:  Wrap-Up Group:   The focus of this group is to help patients review their daily goal of treatment and discuss progress on daily workbooks.  Participation Level:  Active  Participation Quality:  Appropriate  Affect:  Appropriate  Cognitive:  Appropriate  Insight: Appropriate  Engagement in Group:  Engaged  Modes of Intervention:  Discussion  Additional Comments:  [name] 04/06/2018, 9:16 PM

## 2018-04-06 NOTE — Progress Notes (Signed)
Nutrition Brief Note  Patient identified on the Malnutrition Screening Tool (MST) Report  Wt Readings from Last 15 Encounters:  04/05/18 93 kg  04/05/18 93 kg  01/03/18 93.9 kg  04/07/16 97.5 kg  06/03/15 105.7 kg  06/02/15 108.9 kg  08/02/13 111.9 kg  07/11/13 104.3 kg    Body mass index is 24.96 kg/m. Patient meets criteria for normal weight/borderline overweight based on current BMI. Per chart review, weight has been stable over the past 3 months. Current weight is 205 lb and weight on 04/07/16 was 214 lb. This indicates 9 lb weight loss (4% body weight) in the past 2 years.    Current diet order is Carb Modified and patient is eating as desired for meals and snacks at this time. Ensure Enlive was ordered BID per ONS protocol at the time of admission; continue this order, should patient be interested. Labs and medications reviewed.   No nutrition interventions warranted at this time. If nutrition issues arise, please consult RD.     Trenton Gammon, MS, RD, LDN, [facility] Inpatient Clinical Dietitian Pager # [phone] After hours/weekend pager # [phone]
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
## 2018-04-06 NOTE — Progress Notes (Signed)
Pt presents with a flat affect and depressed mood. Pt rates depression 8/10, anxiety 8/10 and hopelessness 8/10. Pt endorses passive SI with no plan or intent. Pt verbally contracts for safety. Pt remains paranoid with thoughts of someone trying to kill him. Pt reports AH telling him to kill himself. Pt reported fair sleep last night. Pt reports withdrawal symptoms of nausea, tremors, chills and irritability. Pt stated goal "getting off theses drugs and alcohol".   Medications reviewed with pt. Medications administered as ordered per MD. Verbal support provided. Pt encouraged to attend groups. 15 minute checks performed for safety.   Pt compliant with tx plan. No side effects to medications verbalized by pt.

## 2018-04-07 LAB — GLUCOSE, CAPILLARY
GLUCOSE-CAPILLARY: 206 mg/dL — AB (ref 70–99)
Glucose-Capillary: 228 mg/dL — ABNORMAL HIGH (ref 70–99)
Glucose-Capillary: 270 mg/dL — ABNORMAL HIGH (ref 70–99)
Glucose-Capillary: 232 mg/dL — ABNORMAL HIGH (ref 70–99)

## 2018-04-07 MED ORDER — GLUCERNA SHAKE PO LIQD
237.0000 mL | Freq: Two times a day (BID) | ORAL | Status: DC
  Administered 2018-04-07 – 2018-04-09 (×5): 237 mL via ORAL

## 2018-04-07 NOTE — Tx Team (Signed)
Interdisciplinary Treatment and Diagnostic Plan Update  04/07/2018 Time of Session: 9:00am Steven Christensen MRN: [id number]  Principal Diagnosis: <principal problem not specified>  Secondary Diagnoses: Active Problems:   Alcohol abuse with alcohol-induced mood disorder (HCC)   Current Medications:  Current Facility-Administered Medications  Medication Dose Route Frequency Provider Last Rate Last Dose  . acetaminophen (TYLENOL) tablet 650 mg  650 mg Oral Q6H PRN Beau Fanny, FNP   650 mg at 04/06/18 0813  . alum & mag hydroxide-simeth (MAALOX/MYLANTA) 200-200-20 MG/5ML suspension 30 mL  30 mL Oral Q4H PRN Withrow, John C, FNP      . amantadine (SYMMETREL) capsule 100 mg  100 mg Oral BID Malvin Johns, MD   100 mg at 04/07/18 0801  . chlordiazePOXIDE (LIBRIUM) capsule 25 mg  25 mg Oral TID Malvin Johns, MD   25 mg at 04/07/18 0800   Followed by  . [START ON 04/08/2018] chlordiazePOXIDE (LIBRIUM) capsule 25 mg  25 mg Oral Nicki Guadalajara, MD       Followed by  . [START ON 04/09/2018] chlordiazePOXIDE (LIBRIUM) capsule 25 mg  25 mg Oral Daily Malvin Johns, MD      . chlordiazePOXIDE (LIBRIUM) capsule 25 mg  25 mg Oral Q6H PRN Malvin Johns, MD      . cloNIDine (CATAPRES) tablet 0.2 mg  0.2 mg Oral BID Malvin Johns, MD   0.2 mg at 04/07/18 0800  . feeding supplement (ENSURE ENLIVE) (ENSURE ENLIVE) liquid 237 mL  237 mL Oral BID BM Malvin Johns, MD   237 mL at 04/06/18 1500  . gabapentin (NEURONTIN) capsule 100 mg  100 mg Oral TID Beau Fanny, FNP   100 mg at 04/07/18 0801  . hydrOXYzine (ATARAX/VISTARIL) tablet 25 mg  25 mg Oral Q6H PRN Beau Fanny, FNP   25 mg at 04/05/18 2123  . insulin aspart (novoLOG) injection 4 Units  4 Units Subcutaneous TID WC Beau Fanny, FNP   4 Units at 04/07/18 1914  . insulin glargine (LANTUS) injection 40 Units  40 Units Subcutaneous QHS Beau Fanny, FNP   40 Units at 04/06/18 2128  . loperamide (IMODIUM) capsule 2-4 mg  2-4 mg Oral PRN Malvin Johns, MD   4 mg at 04/06/18 2147  . magnesium hydroxide (MILK OF MAGNESIA) suspension 30 mL  30 mL Oral Daily PRN Withrow, John C, FNP      . metFORMIN (GLUCOPHAGE) tablet 500 mg  500 mg Oral BID WC Withrow, John C, FNP   500 mg at 04/07/18 0801  . multivitamin with minerals tablet 1 tablet  1 tablet Oral Daily Malvin Johns, MD   1 tablet at 04/07/18 0800  . ondansetron (ZOFRAN-ODT) disintegrating tablet 4 mg  4 mg Oral Q6H PRN Malvin Johns, MD      . risperiDONE (RISPERDAL) tablet 2 mg  2 mg Oral BID Malvin Johns, MD   2 mg at 04/07/18 0801  . thiamine (VITAMIN B-1) tablet 100 mg  100 mg Oral Daily Malvin Johns, MD   100 mg at 04/07/18 0801  . traZODone (DESYREL) tablet 50 mg  50 mg Oral QHS PRN Beau Fanny, FNP   50 mg at 04/05/18 2123   PTA Medications: Medications Prior to Admission  Medication Sig Dispense Refill Last Dose  . ARIPiprazole (ABILIFY) 5 MG tablet Take 1 tablet (5 mg total) by mouth daily. for mood control (Patient not taking: Reported on 04/05/2018) 30 tablet 0 Not Taking at Unknown time  .  gabapentin (NEURONTIN) 100 MG capsule Take 1 capsule (100 mg total) by mouth 3 (three) times daily. (Patient not taking: Reported on 04/05/2018) 90 capsule 0 Not Taking at Unknown time  . hydrOXYzine (ATARAX/VISTARIL) 25 MG tablet Take 1 tablet (25 mg total) by mouth every 6 (six) hours as needed for anxiety. (Patient not taking: Reported on 04/05/2018) 30 tablet 0 Not Taking at Unknown time  . insulin aspart (NOVOLOG) 100 UNIT/ML injection Inject 4 Units into the skin 3 (three) times daily with meals. (Patient not taking: Reported on 04/05/2018) 10 mL 1 Not Taking at Unknown time  . insulin glargine (LANTUS) 100 UNIT/ML injection Inject 0.4 mLs (40 Units total) into the skin every morning. (Patient not taking: Reported on 04/05/2018) 10 mL 1 Not Taking at Unknown time  . metFORMIN (GLUCOPHAGE) 500 MG tablet Take 1 tablet (500 mg total) by mouth 2 (two) times daily with a meal. (Patient not taking:  Reported on 04/05/2018) 60 tablet 0 Not Taking at Unknown time  . traZODone (DESYREL) 50 MG tablet Take 1 tablet (50 mg total) by mouth at bedtime as needed for sleep. (Patient not taking: Reported on 04/05/2018) 30 tablet 0 Not Taking at Unknown time    Patient Stressors: Substance abuse Traumatic event  Patient Strengths: Ability for insight Motivation for treatment/growth  Treatment Modalities: Medication Management, Group therapy, Case management,  1 to 1 session with clinician, Psychoeducation, Recreational therapy.   Physician Treatment Plan for Primary Diagnosis: <principal problem not specified> Long Term Goal(s): Improvement in symptoms so as ready for discharge Improvement in symptoms so as ready for discharge   Short Term Goals: Ability to identify changes in lifestyle to reduce recurrence of condition will improve Ability to verbalize feelings will improve Ability to disclose and discuss suicidal ideas Ability to identify and develop effective coping behaviors will improve Ability to maintain clinical measurements within normal limits will improve  Medication Management: Evaluate patient's response, side effects, and tolerance of medication regimen.  Therapeutic Interventions: 1 to 1 sessions, Unit Group sessions and Medication administration.  Evaluation of Outcomes: Progressing  Physician Treatment Plan for Secondary Diagnosis: Active Problems:   Alcohol abuse with alcohol-induced mood disorder (HCC)  Long Term Goal(s): Improvement in symptoms so as ready for discharge Improvement in symptoms so as ready for discharge   Short Term Goals: Ability to identify changes in lifestyle to reduce recurrence of condition will improve Ability to verbalize feelings will improve Ability to disclose and discuss suicidal ideas Ability to identify and develop effective coping behaviors will improve Ability to maintain clinical measurements within normal limits will improve      Medication Management: Evaluate patient's response, side effects, and tolerance of medication regimen.  Therapeutic Interventions: 1 to 1 sessions, Unit Group sessions and Medication administration.  Evaluation of Outcomes: Progressing   RN Treatment Plan for Primary Diagnosis: <principal problem not specified> Long Term Goal(s): Knowledge of disease and therapeutic regimen to maintain health will improve  Short Term Goals: Ability to remain free from injury will improve, Ability to disclose and discuss suicidal ideas, Ability to identify and develop effective coping behaviors will improve and Compliance with prescribed medications will improve  Medication Management: RN will administer medications as ordered by provider, will assess and evaluate patient's response and provide education to patient for prescribed medication. RN will report any adverse and/or side effects to prescribing provider.  Therapeutic Interventions: 1 on 1 counseling sessions, Psychoeducation, Medication administration, Evaluate responses to treatment, Monitor vital signs and CBGs as  ordered, Perform/monitor CIWA, COWS, AIMS and Fall Risk screenings as ordered, Perform wound care treatments as ordered.  Evaluation of Outcomes: Progressing   LCSW Treatment Plan for Primary Diagnosis: <principal problem not specified> Long Term Goal(s): Safe transition to appropriate next level of care at discharge, Engage patient in therapeutic group addressing interpersonal concerns.  Short Term Goals: Engage patient in aftercare planning with referrals and resources, Increase social support, Increase emotional regulation, Facilitate patient progression through stages of change regarding substance use diagnoses and concerns, Identify triggers associated with mental health/substance abuse issues and Increase skills for wellness and recovery  Therapeutic Interventions: Assess for all discharge needs, 1 to 1 time with Social worker,  Explore available resources and support systems, Assess for adequacy in community support network, Educate family and significant other(s) on suicide prevention, Complete Psychosocial Assessment, Interpersonal group therapy.  Evaluation of Outcomes: Progressing   Progress in Treatment: Attending groups: Yes. Participating in groups: Yes. Taking medication as prescribed: Yes. Toleration medication: Yes. Family/Significant other contact made: No, will contact:  mother Patient understands diagnosis: Yes. Discussing patient identified problems/goals with staff: Yes. Medical problems stabilized or resolved: No. Denies suicidal/homicidal ideation: No. Issues/concerns per patient self-inventory: Yes.  New problem(s) identified: No, Describe:  CSW continuing to assess  New Short Term/Long Term Goal(s): detox, medication management for mood stabilization; elimination of SI thoughts; development of comprehensive mental wellness/sobriety plan.  Patient Goals:  "I want to work on getting off drugs and drinking. I want to be a better person for my mom and daughter." Patient wants residential substance use treatment.  Discharge Plan or Barriers: Referrals for residential substance use treatment. [facility] pamphlet, Mobile Crisis information, and AA/NA information provided to patient for additional community support and resources.   Reason for Continuation of Hospitalization: Anxiety Delusions  Depression Hallucinations Medication stabilization Suicidal ideation  Estimated Length of Stay: 5 days  Attendees: Patient: 04/07/2018 10:29 AM  Physician:  04/07/2018 10:29 AM  Nursing:  04/07/2018 10:29 AM  RN Care Manager: 04/07/2018 10:29 AM  Social Worker: Enid Cutterharlotte Masahiro Iglesia, LCSWA 04/07/2018 10:29 AM  Recreational Therapist:  04/07/2018 10:29 AM  Other:  04/07/2018 10:29 AM  Other:  04/07/2018 10:29 AM  Other: 04/07/2018 10:29 AM    Scribe for Treatment Team: Darreld Mcleanharlotte C Marifer Hurd, LCSWA 04/07/2018 10:29 AM

## 2018-04-07 NOTE — Progress Notes (Signed)
D Pt is seen OOB UAL on the 500 hall today. HE is reserved, he is quiet and he is blunted and flat. HE takes his scheduled medications as planned, he sits in the dayroom during groups but is not actively engaged in his therapies. He denies active withdrawal symptoms and he allows staff to check his cbg's ac ( 206 at 1200 and 270 at 1700).    A HE completed his daily assessment and on this he wrote he denied SI today and he rated his depression, hopelessness and anxiety " 2/2/2/", respectively. He says he is not eating " too good" and he is taking po glucerna nutritional shakes per MD oirder.he is not sure about the hallucinations- he remains guarded and suspicious.    R Safety is in place.

## 2018-04-07 NOTE — H&P (Signed)
PT attended spirituality group led by chaplain.   Group focused on topic of hope.  Group members engaged in facilitated discussion around topic of hope, identifying areas they have experienced hope in their life and resources for hope.   Engaged in visual explorer activity to identify what hope means for them today.

## 2018-04-07 NOTE — [facility] Group Notes (Addendum)
Adult Psychoeducational Group Note  Date:  04/07/2018 Time:  9:23 PM  Group Topic/Focus:  Wrap-Up Group:   The focus of this group is to help patients review their daily goal of treatment and discuss progress on daily workbooks.  Participation Level:  Active  Participation Quality:  Appropriate and Attentive  Affect:  Appropriate  Cognitive:  Alert and Appropriate  Insight: Appropriate and Good  Engagement in Group:  Engaged  Modes of Intervention:  Discussion and Education  Additional Comments:  Pt attended and participated in wrap up group this evening. Pt rated their day a 5/10, due to them "waking up today". Pt also slept well today, which was their goal.   Chrisandra Netters 04/07/2018, 9:23 PM

## 2018-04-07 NOTE — Progress Notes (Signed)
[facility] MD Progress Note  04/07/2018 9:36 AM Steven Christensen  MRN:  [id number] Subjective:   Patient reports incontinence last night which is somewhat embarrassing to him thinks his medications may have contributed but he states that he is not worried about his safety here but is still somewhat paranoid was discussions with other staff members denies wanting to harm others or self denies auditory and visual hallucinations. Believes the detox regimen is necessary and helpful thus far Principal Problem: Alcohol dependence/paranoia Diagnosis: Active Problems:   Alcohol abuse with alcohol-induced mood disorder (HCC)  Total Time spent with patient: 20 minutes Past Medical History:  Past Medical History:  Diagnosis Date  . Anxiety   . Bipolar affective (HCC)   . Depression   . Diabetes mellitus without complication (HCC)   . Schizophrenia (HCC)   . Sleeping difficulties    History reviewed. No pertinent surgical history. Family History:  Family History  Problem Relation Age of Onset  . Diabetes Maternal Aunt   . Mental illness Neg Hx    Social History:  Social History   Substance and Sexual Activity  Alcohol Use Yes   Comment: Beer and alcohol     Social History   Substance and Sexual Activity  Drug Use Yes  . Types: "Crack" cocaine, Marijuana   Comment: Last use of crack was today    Social History   Socioeconomic History  . Marital status: Single    Spouse name: Not on file  . Number of children: Not on file  . Years of education: Not on file  . Highest education level: Not on file  Occupational History  . Occupation: Disabled  Social Needs  . Financial resource strain: Not on file  . Food insecurity:    Worry: Not on file    Inability: Not on file  . Transportation needs:    Medical: Not on file    Non-medical: Not on file  Tobacco Use  . Smoking status: Current Every Day Smoker    Packs/day: 1.00    Years: 35.00    Pack years: 35.00    Types: Cigarettes  .  Smokeless tobacco: Never Used  Substance and Sexual Activity  . Alcohol use: Yes    Comment: Beer and alcohol  . Drug use: Yes    Types: "Crack" cocaine, Marijuana    Comment: Last use of crack was today  . Sexual activity: Yes    Birth control/protection: None  Lifestyle  . Physical activity:    Days per week: Not on file    Minutes per session: Not on file  . Stress: Not on file  Relationships  . Social connections:    Talks on phone: Not on file    Gets together: Not on file    Attends religious service: Not on file    Active member of club or organization: Not on file    Attends meetings of clubs or organizations: Not on file    Relationship status: Not on file  Other Topics Concern  . Not on file  Social History Narrative   Pt lives in [place] with mother.  On disability.  Stated that he was previously followed by [facility] but has not been in a while.   Additional Social History:                         Sleep: Good  Appetite:  Good  Current Medications: Current Facility-Administered Medications  Medication Dose Route Frequency  Provider Last Rate Last Dose  . acetaminophen (TYLENOL) tablet 650 mg  650 mg Oral Q6H PRN Beau Fanny, FNP   650 mg at 04/06/18 0813  . alum & mag hydroxide-simeth (MAALOX/MYLANTA) 200-200-20 MG/5ML suspension 30 mL  30 mL Oral Q4H PRN Withrow, John C, FNP      . amantadine (SYMMETREL) capsule 100 mg  100 mg Oral BID Malvin Johns, MD   100 mg at 04/07/18 0801  . chlordiazePOXIDE (LIBRIUM) capsule 25 mg  25 mg Oral TID Malvin Johns, MD   25 mg at 04/07/18 0800   Followed by  . [START ON 04/08/2018] chlordiazePOXIDE (LIBRIUM) capsule 25 mg  25 mg Oral Nicki Guadalajara, MD       Followed by  . [START ON 04/09/2018] chlordiazePOXIDE (LIBRIUM) capsule 25 mg  25 mg Oral Daily Malvin Johns, MD      . chlordiazePOXIDE (LIBRIUM) capsule 25 mg  25 mg Oral Q6H PRN Malvin Johns, MD      . cloNIDine (CATAPRES) tablet 0.2 mg  0.2 mg Oral BID  Malvin Johns, MD   0.2 mg at 04/07/18 0800  . feeding supplement (ENSURE ENLIVE) (ENSURE ENLIVE) liquid 237 mL  237 mL Oral BID BM Malvin Johns, MD   237 mL at 04/06/18 1500  . gabapentin (NEURONTIN) capsule 100 mg  100 mg Oral TID Beau Fanny, FNP   100 mg at 04/07/18 0801  . hydrOXYzine (ATARAX/VISTARIL) tablet 25 mg  25 mg Oral Q6H PRN Beau Fanny, FNP   25 mg at 04/05/18 2123  . insulin aspart (novoLOG) injection 4 Units  4 Units Subcutaneous TID WC Beau Fanny, FNP   4 Units at 04/07/18 1594  . insulin glargine (LANTUS) injection 40 Units  40 Units Subcutaneous QHS Beau Fanny, FNP   40 Units at 04/06/18 2128  . loperamide (IMODIUM) capsule 2-4 mg  2-4 mg Oral PRN Malvin Johns, MD   4 mg at 04/06/18 2147  . magnesium hydroxide (MILK OF MAGNESIA) suspension 30 mL  30 mL Oral Daily PRN Withrow, John C, FNP      . metFORMIN (GLUCOPHAGE) tablet 500 mg  500 mg Oral BID WC Withrow, John C, FNP   500 mg at 04/07/18 0801  . multivitamin with minerals tablet 1 tablet  1 tablet Oral Daily Malvin Johns, MD   1 tablet at 04/07/18 0800  . ondansetron (ZOFRAN-ODT) disintegrating tablet 4 mg  4 mg Oral Q6H PRN Malvin Johns, MD      . risperiDONE (RISPERDAL) tablet 2 mg  2 mg Oral BID Malvin Johns, MD   2 mg at 04/07/18 0801  . thiamine (VITAMIN B-1) tablet 100 mg  100 mg Oral Daily Malvin Johns, MD   100 mg at 04/07/18 0801  . traZODone (DESYREL) tablet 50 mg  50 mg Oral QHS PRN Beau Fanny, FNP   50 mg at 04/05/18 2123    Lab Results:  Results for orders placed or performed during the hospital encounter of 04/05/18 (from the past 48 hour(s))  Glucose, capillary     Status: Abnormal   Collection Time: 04/05/18  5:01 PM  Result Value Ref Range   Glucose-Capillary 228 (H) 70 - 99 mg/dL  Glucose, capillary     Status: Abnormal   Collection Time: 04/05/18  8:26 PM  Result Value Ref Range   Glucose-Capillary 178 (H) 70 - 99 mg/dL   Comment 1 Notify RN    Comment 2 Document in Chart  Glucose, capillary     Status: Abnormal   Collection Time: 04/06/18  6:01 AM  Result Value Ref Range   Glucose-Capillary 199 (H) 70 - 99 mg/dL  TSH     Status: Abnormal   Collection Time: 04/06/18  6:32 AM  Result Value Ref Range   TSH 0.307 (L) 0.350 - 4.500 uIU/mL    Comment: Performed by a 3rd Generation assay with a functional sensitivity of <=0.01 uIU/mL. Performed at [facility], [address] [place], [place] [address]   Glucose, capillary     Status: Abnormal   Collection Time: 04/06/18 12:01 PM  Result Value Ref Range   Glucose-Capillary 188 (H) 70 - 99 mg/dL  Glucose, capillary     Status: Abnormal   Collection Time: 04/06/18  4:56 PM  Result Value Ref Range   Glucose-Capillary 265 (H) 70 - 99 mg/dL  Glucose, capillary     Status: Abnormal   Collection Time: 04/06/18  8:22 PM  Result Value Ref Range   Glucose-Capillary 270 (H) 70 - 99 mg/dL  Glucose, capillary     Status: Abnormal   Collection Time: 04/07/18  6:10 AM  Result Value Ref Range   Glucose-Capillary 228 (H) 70 - 99 mg/dL    Blood Alcohol level:  Lab Results  Component Value Date   ETH 72 (H) 04/05/2018   ETH <10 01/03/2018    Metabolic Disorder Labs: Lab Results  Component Value Date   HGBA1C 10.3 (H) 01/05/2018   MPG 249 01/05/2018   MPG 275 04/09/2016   Lab Results  Component Value Date   PROLACTIN 13.3 04/09/2016   PROLACTIN 74.0 (H) 06/03/2015   Lab Results  Component Value Date   CHOL 145 01/05/2018   TRIG 72 01/05/2018   HDL 45 01/05/2018   CHOLHDL 3.2 01/05/2018   VLDL 14 01/05/2018   LDLCALC 86 01/05/2018   LDLCALC 81 04/09/2016    Physical Findings: AIMS: Facial and Oral Movements Muscles of Facial Expression: None, normal Lips and Perioral Area: None, normal Jaw: None, normal Tongue: None, normal,Extremity Movements Upper (arms, wrists, hands, fingers): None, normal Lower (legs, knees, ankles, toes): None, normal, Trunk Movements Neck, shoulders,  hips: None, normal, Overall Severity Severity of abnormal movements (highest score from questions above): None, normal Incapacitation due to abnormal movements: None, normal Patient's awareness of abnormal movements (rate only patient's report): No Awareness, Dental Status Current problems with teeth and/or dentures?: No Does patient usually wear dentures?: No  CIWA:  CIWA-Ar Total: 4 COWS:     Musculoskeletal: Strength & Muscle Tone: within normal limits Gait & Station: normal Patient leans: N/A  Psychiatric Specialty Exam: Physical Exam  ROS  Blood pressure 124/82, pulse 66, temperature (!) 97.5 F (36.4 C), temperature source Oral, resp. rate 16, height 6\' 4"  (1.93 m), weight 93 kg.Body mass index is 24.96 kg/m.  General Appearance: Disheveled  Eye Contact:  Poor  Speech:  Slow  Volume:  Decreased  Mood:  Dysphoric  Affect:  Congruent  Thought Process:  Descriptions of Associations: Tangential  Orientation:  Full (Time, Place, and Person)  Thought Content:  Paranoid Ideation  Suicidal Thoughts:  neg  Homicidal Thoughts:  No  Memory:  Immediate;   Fair  Judgement:  Fair  Insight:  Fair  Psychomotor Activity:  Psychomotor Retardation  Concentration:  Concentration: Fair  Recall: 3 Of 3 and 1 of 3  Fund of Knowledge:  Good  Language: Coherent adequate  Akathisia:  Negative  Handed:  Right  AIMS (if indicated):     Assets:  Desire for Improvement  ADL's:  Intact  Cognition:  WNL  Sleep:  Number of Hours: 6.25     Treatment Plan Summary: Daily contact with patient to assess and evaluate symptoms and progress in treatment, Medication management and Plan Continue detox regimen continue PRN GI medications continue antipsychotic at low-dose for paranoid ideation continue reality based cognitive-based and rehab based therapies monitor through the weekend  [facility], MD 04/07/2018, 9:36 AM

## 2018-04-08 DIAGNOSIS — F332 Major depressive disorder, recurrent severe without psychotic features: Secondary | ICD-10-CM

## 2018-04-08 LAB — GLUCOSE, CAPILLARY
Glucose-Capillary: 260 mg/dL — ABNORMAL HIGH (ref 70–99)
Glucose-Capillary: 226 mg/dL — ABNORMAL HIGH (ref 70–99)
Glucose-Capillary: 286 mg/dL — ABNORMAL HIGH (ref 70–99)
Glucose-Capillary: 254 mg/dL — ABNORMAL HIGH (ref 70–99)

## 2018-04-08 MED ORDER — RISPERIDONE 2 MG PO TABS
2.0000 mg | ORAL_TABLET | Freq: Every day | ORAL | Status: DC
  Administered 2018-04-09: 2 mg via ORAL
  Filled 2018-04-08 (×3): qty 1

## 2018-04-08 MED ORDER — RISPERIDONE 3 MG PO TABS
3.0000 mg | ORAL_TABLET | Freq: Every day | ORAL | Status: DC
  Administered 2018-04-08: 3 mg via ORAL
  Filled 2018-04-08: qty 1
  Filled 2018-04-08: qty 3
  Filled 2018-04-08: qty 1

## 2018-04-08 MED ORDER — METFORMIN HCL 850 MG PO TABS
850.0000 mg | ORAL_TABLET | Freq: Two times a day (BID) | ORAL | Status: DC
  Administered 2018-04-08 – 2018-04-10 (×4): 850 mg via ORAL
  Filled 2018-04-08 (×10): qty 1

## 2018-04-08 NOTE — BHH Group Notes (Signed)
Adult Psychoeducational Group Note  Date:  04/08/2018 Time:  3:40 PM  Group Topic/Focus:  Personal Choices and Values:   The focus of this group is to help patients assess and explore the importance of values in their lives, how their values affect their decisions, how they express their values and what opposes their expression.  Participation Level:  Active  Participation Quality:  Appropriate  Affect:  Appropriate  Cognitive:  Appropriate  Insight: Appropriate  Engagement in Group:  Engaged  Modes of Intervention:  Activity, Discussion, Exploration, Rapport Building, Socialization and Support  Additional Comments:  Pt attended and participated during RN Psychoeducational group this morning.  Tania Ade 04/08/2018, 3:40 PM

## 2018-04-08 NOTE — Progress Notes (Signed)
[facility] MD Progress Note  04/08/2018 8:36 AM Franklyn Barsh  MRN:  [id number] Subjective: Patient is seen and examined.  Patient is a 48 year old male with a past psychiatric history significant for alcohol dependence, alcohol induced mood disorder, reported history of bipolar affective disorder, as well as schizophrenia.  Patient was admitted on 04/05/2018 with suicidal ideation, depression, auditory hallucinations as well as paranoia.  Objective: Patient is seen and examined.  Patient stated that he is doing a bit better.  He stated he was fatigued.  There had been complaints of diarrhea, but he stated he had not had any diarrhea in the last 24 hours.  He denied auditory or visual hallucinations.  He denied suicidal ideation currently.  Flowsheet showed that his vital signs are stable, he is afebrile.  He is still not sleeping well.  He reportedly only got 5.75 hours last night.  He stated that he had been living with his mother, but he had hoped to get into a residential substance abuse program after discharge.  He has a history of diabetes mellitus, and his blood sugar is significantly elevated this a.m.  It was 260.  He continues on the Librium taper for alcoholism, amantadine, and Risperdal.  His diabetic medications include metformin 500 mg p.o. twice daily. Principal Problem: <principal problem not specified> Diagnosis: Active Problems:   Alcohol abuse with alcohol-induced mood disorder (HCC)  Total Time spent with patient: 20 minutes  Past Psychiatric History: Admission H&P  Past Medical History:  Past Medical History:  Diagnosis Date  . Anxiety   . Bipolar affective (HCC)   . Depression   . Diabetes mellitus without complication (HCC)   . Schizophrenia (HCC)   . Sleeping difficulties    History reviewed. No pertinent surgical history. Family History:  Family History  Problem Relation Age of Onset  . Diabetes Maternal Aunt   . Mental illness Neg Hx    Family Psychiatric  History: See  admission H&P Social History:  Social History   Substance and Sexual Activity  Alcohol Use Yes   Comment: Beer and alcohol     Social History   Substance and Sexual Activity  Drug Use Yes  . Types: "Crack" cocaine, Marijuana   Comment: Last use of crack was today    Social History   Socioeconomic History  . Marital status: Single    Spouse name: Not on file  . Number of children: Not on file  . Years of education: Not on file  . Highest education level: Not on file  Occupational History  . Occupation: Disabled  Social Needs  . Financial resource strain: Not on file  . Food insecurity:    Worry: Not on file    Inability: Not on file  . Transportation needs:    Medical: Not on file    Non-medical: Not on file  Tobacco Use  . Smoking status: Current Every Day Smoker    Packs/day: 1.00    Years: 35.00    Pack years: 35.00    Types: Cigarettes  . Smokeless tobacco: Never Used  Substance and Sexual Activity  . Alcohol use: Yes    Comment: Beer and alcohol  . Drug use: Yes    Types: "Crack" cocaine, Marijuana    Comment: Last use of crack was today  . Sexual activity: Yes    Birth control/protection: None  Lifestyle  . Physical activity:    Days per week: Not on file    Minutes per session: Not on file  .  Stress: Not on file  Relationships  . Social connections:    Talks on phone: Not on file    Gets together: Not on file    Attends religious service: Not on file    Active member of club or organization: Not on file    Attends meetings of clubs or organizations: Not on file    Relationship status: Not on file  Other Topics Concern  . Not on file  Social History Narrative   Pt lives in [place] with mother.  On disability.  Stated that he was previously followed by [facility], but has not been in a while.   Additional Social History:                         Sleep: Fair  Appetite:  Fair  Current Medications: Current Facility-Administered  Medications  Medication Dose Route Frequency Provider Last Rate Last Dose  . acetaminophen (TYLENOL) tablet 650 mg  650 mg Oral Q6H PRN Beau Fanny, FNP   650 mg at 04/06/18 0813  . alum & mag hydroxide-simeth (MAALOX/MYLANTA) 200-200-20 MG/5ML suspension 30 mL  30 mL Oral Q4H PRN Withrow, John C, FNP      . amantadine (SYMMETREL) capsule 100 mg  100 mg Oral BID Malvin Johns, MD   100 mg at 04/08/18 0739  . chlordiazePOXIDE (LIBRIUM) capsule 25 mg  25 mg Oral Nicki Guadalajara, MD   25 mg at 04/08/18 0739   Followed by  . [START ON 04/09/2018] chlordiazePOXIDE (LIBRIUM) capsule 25 mg  25 mg Oral Daily Malvin Johns, MD      . chlordiazePOXIDE (LIBRIUM) capsule 25 mg  25 mg Oral Q6H PRN Malvin Johns, MD   25 mg at 04/07/18 2244  . cloNIDine (CATAPRES) tablet 0.2 mg  0.2 mg Oral BID Malvin Johns, MD   0.2 mg at 04/08/18 0739  . feeding supplement (GLUCERNA SHAKE) (GLUCERNA SHAKE) liquid 237 mL  237 mL Oral BID BM Antonieta Pert, MD   237 mL at 04/07/18 1602  . gabapentin (NEURONTIN) capsule 100 mg  100 mg Oral TID Beau Fanny, FNP   100 mg at 04/08/18 0738  . hydrOXYzine (ATARAX/VISTARIL) tablet 25 mg  25 mg Oral Q6H PRN Beau Fanny, FNP   25 mg at 04/05/18 2123  . insulin aspart (novoLOG) injection 4 Units  4 Units Subcutaneous TID WC Beau Fanny, FNP   4 Units at 04/08/18 0601  . insulin glargine (LANTUS) injection 40 Units  40 Units Subcutaneous QHS Beau Fanny, FNP   40 Units at 04/07/18 2100  . loperamide (IMODIUM) capsule 2-4 mg  2-4 mg Oral PRN Malvin Johns, MD   4 mg at 04/06/18 2147  . magnesium hydroxide (MILK OF MAGNESIA) suspension 30 mL  30 mL Oral Daily PRN Withrow, John C, FNP      . metFORMIN (GLUCOPHAGE) tablet 500 mg  500 mg Oral BID WC Withrow, John C, FNP   500 mg at 04/08/18 0739  . multivitamin with minerals tablet 1 tablet  1 tablet Oral Daily Malvin Johns, MD   1 tablet at 04/08/18 0739  . ondansetron (ZOFRAN-ODT) disintegrating tablet 4 mg  4 mg Oral  Q6H PRN Malvin Johns, MD      . Melene Muller ON 04/09/2018] risperiDONE (RISPERDAL) tablet 2 mg  2 mg Oral Daily Antonieta Pert, MD      . risperiDONE (RISPERDAL) tablet 3 mg  3 mg Oral QHS ,  Marlane Mingle, MD      . thiamine (VITAMIN B-1) tablet 100 mg  100 mg Oral Daily Malvin Johns, MD   100 mg at 04/08/18 0739  . traZODone (DESYREL) tablet 50 mg  50 mg Oral QHS PRN Beau Fanny, FNP   50 mg at 04/07/18 2303    Lab Results:  Results for orders placed or performed during the hospital encounter of 04/05/18 (from the past 48 hour(s))  Glucose, capillary     Status: Abnormal   Collection Time: 04/06/18 12:01 PM  Result Value Ref Range   Glucose-Capillary 188 (H) 70 - 99 mg/dL  Glucose, capillary     Status: Abnormal   Collection Time: 04/06/18  4:56 PM  Result Value Ref Range   Glucose-Capillary 265 (H) 70 - 99 mg/dL  Glucose, capillary     Status: Abnormal   Collection Time: 04/06/18  8:22 PM  Result Value Ref Range   Glucose-Capillary 270 (H) 70 - 99 mg/dL  Glucose, capillary     Status: Abnormal   Collection Time: 04/07/18  6:10 AM  Result Value Ref Range   Glucose-Capillary 228 (H) 70 - 99 mg/dL  Glucose, capillary     Status: Abnormal   Collection Time: 04/07/18 12:06 PM  Result Value Ref Range   Glucose-Capillary 206 (H) 70 - 99 mg/dL   Comment 1 Notify RN    Comment 2 Document in Chart   Glucose, capillary     Status: Abnormal   Collection Time: 04/07/18  4:50 PM  Result Value Ref Range   Glucose-Capillary 270 (H) 70 - 99 mg/dL   Comment 1 Notify RN   Glucose, capillary     Status: Abnormal   Collection Time: 04/07/18  8:59 PM  Result Value Ref Range   Glucose-Capillary 232 (H) 70 - 99 mg/dL  Glucose, capillary     Status: Abnormal   Collection Time: 04/08/18  5:57 AM  Result Value Ref Range   Glucose-Capillary 260 (H) 70 - 99 mg/dL   Comment 1 Notify RN    Comment 2 Document in Chart     Blood Alcohol level:  Lab Results  Component Value Date   ETH 72 (H)  04/05/2018   ETH <10 01/03/2018    Metabolic Disorder Labs: Lab Results  Component Value Date   HGBA1C 10.3 (H) 01/05/2018   MPG 249 01/05/2018   MPG 275 04/09/2016   Lab Results  Component Value Date   PROLACTIN 13.3 04/09/2016   PROLACTIN 74.0 (H) 06/03/2015   Lab Results  Component Value Date   CHOL 145 01/05/2018   TRIG 72 01/05/2018   HDL 45 01/05/2018   CHOLHDL 3.2 01/05/2018   VLDL 14 01/05/2018   LDLCALC 86 01/05/2018   LDLCALC 81 04/09/2016    Physical Findings: AIMS: Facial and Oral Movements Muscles of Facial Expression: None, normal Lips and Perioral Area: None, normal Jaw: None, normal Tongue: None, normal,Extremity Movements Upper (arms, wrists, hands, fingers): None, normal Lower (legs, knees, ankles, toes): None, normal, Trunk Movements Neck, shoulders, hips: None, normal, Overall Severity Severity of abnormal movements (highest score from questions above): None, normal Incapacitation due to abnormal movements: None, normal Patient's awareness of abnormal movements (rate only patient's report): No Awareness, Dental Status Current problems with teeth and/or dentures?: No Does patient usually wear dentures?: No  CIWA:  CIWA-Ar Total: 0 COWS:     Musculoskeletal: Strength & Muscle Tone: within normal limits Gait & Station: normal Patient leans: N/A  Psychiatric Specialty  Exam: Physical Exam  Nursing note and vitals reviewed. Constitutional: He is oriented to person, place, and time. He appears well-developed and well-nourished.  HENT:  Head: Normocephalic and atraumatic.  Respiratory: Effort normal.  Neurological: He is alert and oriented to person, place, and time.    ROS  Blood pressure 118/77, pulse 71, temperature 98.2 F (36.8 C), temperature source Oral, resp. rate 18, height 6\' 4"  (1.93 m), weight 93 kg.Body mass index is 24.96 kg/m.  General Appearance: Disheveled  Eye Contact:  Fair  Speech:  Normal Rate  Volume:  Decreased   Mood:  Dysphoric  Affect:  Congruent  Thought Process:  Coherent and Descriptions of Associations: Intact  Orientation:  Full (Time, Place, and Person)  Thought Content:  Delusions and Paranoid Ideation  Suicidal Thoughts:  No  Homicidal Thoughts:  No  Memory:  Immediate;   Fair Recent;   Fair Remote;   Fair  Judgement:  Intact  Insight:  Fair  Psychomotor Activity:  Psychomotor Retardation  Concentration:  Concentration: Fair and Attention Span: Fair  Recall:  [employer]  Fund of Knowledge:  Fair  Language:  Fair  Akathisia:  Negative  Handed:  Right  AIMS (if indicated):     Assets:  Desire for Improvement Housing Physical Health Resilience  ADL's:  Intact  Cognition:  WNL  Sleep:  Number of Hours: 5.75     Treatment Plan Summary: Daily contact with patient to assess and evaluate symptoms and progress in treatment, Medication management and Plan : Patient is seen and examined.  Patient is a 48 year old male with the above-stated past psychiatric history and medical history who is seen in follow-up.  Patient is slowly improving, his paranoia has decreased, his auditory hallucinations have resolved.  He is still not sleeping well.  I am going to increase his Risperdal to 2 mg p.o. daily and 3 mg p.o. nightly.  Hopefully that will help additionally with the paranoia and auditory hallucinations.  He will continue on the Librium taper.  His blood sugar remains elevated, and I am going to increase his metformin.  Hopefully that will not lead to significant diarrhea.  If it does we may have to increase his insulin dose.  He is currently getting 4 mg of NovoLog 3 times daily with meals, and Lantus 40 units subcu nightly.  No change in his clonidine at this point. 1.  Increase Risperdal to 2 mg p.o. daily and 3 mg p.o. nightly for mood stability, psychosis and sleep. 2.  Increase Metformin to 850 mg p.o. twice daily for diabetes. 3.  Continue NovoLog and Lantus insulin as currently written for  diabetes. 4.  Continue clonidine 0.2 mg p.o. twice daily for hypertension. 5.  Continue Librium taper for alcohol withdrawal symptoms. 6.  Continue Symmetrel 100 mg p.o. twice daily for neuromuscular issues. 7.  Discharge planning-in progress.  Antonieta PertGreg Lawson , MD 04/08/2018, 8:36 AM

## 2018-04-08 NOTE — Plan of Care (Signed)
  Problem: Activity: Goal: Interest or engagement in activities will improve Outcome: Progressing    D: Pt alert and oriented on the unit. Pt engaging with RN staff and other pts. Pt denies SI/HI, A/VH. Pt also participated during unit groups and activities. Pt is pleasant and cooperative. Pt rated his depression a 1 and both his hopelessness and anxiety a 0, respectively. Pt's goal for today is "control and getting out." A: Education, support and encouragement provided, q15 minute safety checks remain in effect. Medications administered per MD orders. R: No reactions/side effects to medicine noted. Pt denies any concerns at this time, and verbally contracts for safety. Pt ambulating on the unit with no issues. Pt remains safe on and off the unit.

## 2018-04-08 NOTE — BHH Counselor (Signed)
Clinical Social Work Note  Met with pt at his request.  He asked if he could possibly go to [facility], and was told his referral was declined because nobody can go there on any injectable medication.  He would still like to go to a rehab program, would consider [facility] or [facility] if they accept the insulin.  He liked the idea of [facility] the best and states his mother could transport him there.  Information left for weekday CSW.  Selmer Dominion, LCSW 04/08/2018, 5:36 PM

## 2018-04-08 NOTE — Progress Notes (Signed)
D.  Pt pleasant but guarded on approach, no complaints voiced other than some s/s of withdrawal.  Pt was positive for evening wrap up group, interacting appropriately with peers on the unit.  Pt denies SI/HI/AVH at this time. Got do not admit roommate on Pt for tonight due to diarrhea as peer had requested to be removed from room for this reason.. A.  Support and encouragement offered, medication given as ordered.  R.  Pt remains safe on the unit, will continue to monitor.

## 2018-04-08 NOTE — [facility] Group Notes (Signed)
LCSW Group Therapy Note  04/08/2018    11:15am-12:00pm  Type of Therapy and Topic:  Group Therapy: Early Messages Received About Anger  Participation Level:  Did Not Attend   Description of Group:   In this group, patients shared and discussed the early messages received in their lives about anger through parental or other adult modeling, teaching, repression, punishment, violence, and more.  Participants identified how those childhood lessons influence even now how they usually or often react when angered.  The group discussed that anger is a secondary emotion and what may be the underlying emotional themes that come out through anger outbursts or that are ignored through anger suppression.  Finally, as a group there was a conversation about the workbook's quote that "There is nothing wrong with anger; it is just a sign something needs to change."     Therapeutic Goals: 1. Patients will identify one or more childhood message about anger that they received and how it was taught to them. 2. Patients will discuss how these childhood experiences have influenced and continue to influence their own expression or repression of anger even today. 3. Patients will explore possible primary emotions that tend to fuel their secondary emotion of anger. 4. Patients will learn that anger itself is normal and cannot be eliminated, and that healthier coping skills can assist with resolving conflict rather than worsening situations.  Summary of Patient Progress:  N/A  Therapeutic Modalities:   Cognitive Behavioral Therapy Motivation Interviewing  Lynnell Chad  .

## 2018-04-09 LAB — GLUCOSE, CAPILLARY
GLUCOSE-CAPILLARY: 255 mg/dL — AB (ref 70–99)
Glucose-Capillary: 389 mg/dL — ABNORMAL HIGH (ref 70–99)
Glucose-Capillary: 246 mg/dL — ABNORMAL HIGH (ref 70–99)
Glucose-Capillary: 224 mg/dL — ABNORMAL HIGH (ref 70–99)

## 2018-04-09 MED ORDER — RISPERIDONE 2 MG PO TABS
2.0000 mg | ORAL_TABLET | Freq: Every day | ORAL | Status: DC
  Administered 2018-04-09: 2 mg via ORAL
  Filled 2018-04-09: qty 1
  Filled 2018-04-09: qty 7
  Filled 2018-04-09: qty 1

## 2018-04-09 MED ORDER — INSULIN GLARGINE 100 UNIT/ML ~~LOC~~ SOLN
50.0000 [IU] | Freq: Every day | SUBCUTANEOUS | Status: DC
  Administered 2018-04-09: 50 [IU] via SUBCUTANEOUS
  Filled 2018-04-09: qty 0.5

## 2018-04-09 NOTE — Plan of Care (Addendum)
D: Patient presents depressed, lethargic. He was moving very slowly this morning, taking a long time to sit up in bed. He reports feeling sluggish. He slept good, and received medication that was helpful. His appetite is good, energy low and concentration good. He rates his depression, hopelessness and anxiety 2/10. He denies withdrawal symptoms or physical complaints. His gait is unsteady once he started moving around this morning and he was given a walker and made a high fall risk. Patient denies SI/HI/AVH.  A: Patient checked q15 min, and checks reviewed. Reviewed medication changes with patient and educated on side effects. Educated patient on importance of attending group therapy sessions and educated on several coping skills. Encouarged participation in milieu through recreation therapy and attending meals with peers. Support and encouragement provided. Fluids offered. R: Patient receptive to education on medications, and is medication compliant. Patient contracts for safety on the unit. Goal: "Doing right, eating right."

## 2018-04-09 NOTE — Progress Notes (Signed)
Patient signed 72 hr request for discharge today 04/09/17 at 1732.

## 2018-04-09 NOTE — Progress Notes (Signed)
[facility] MD Progress Note  04/09/2018 10:35 AM Steven Christensen  MRN:  [id number] Subjective:  Patient is seen and examined.  Patient is a 48 year old male with a past psychiatric history significant for alcohol dependence, alcohol induced mood disorder, reported history of bipolar affective disorder, as well as schizophrenia.  Patient was admitted on 04/05/2018 with suicidal ideation, depression, auditory hallucinations as well as paranoia.  Objective: Patient is seen and examined.  Patient is a 48 year old male with the above-stated past psychiatric history is seen in follow-up.  He is very unstable on his feet this morning.  He is very oversedated.  His blood sugar this morning was 389.  He denied any auditory or visual hallucinations.  No gross paranoia was noted.  He is just very unstable on his feet and sedated.  His blood pressure is fine at 125/69.  Pulse is 59, he is afebrile.  No new laboratories except for his blood sugars previously mentioned.  Principal Problem: <principal problem not specified> Diagnosis: Active Problems:   Alcohol abuse with alcohol-induced mood disorder (HCC)  Total Time spent with patient: 15 minutes  Past Psychiatric History: See admission H&P  Past Medical History:  Past Medical History:  Diagnosis Date  . Anxiety   . Bipolar affective (HCC)   . Depression   . Diabetes mellitus without complication (HCC)   . Schizophrenia (HCC)   . Sleeping difficulties    History reviewed. No pertinent surgical history. Family History:  Family History  Problem Relation Age of Onset  . Diabetes Maternal Aunt   . Mental illness Neg Hx    Family Psychiatric  History: See admission H&P Social History:  Social History   Substance and Sexual Activity  Alcohol Use Yes   Comment: Beer and alcohol     Social History   Substance and Sexual Activity  Drug Use Yes  . Types: "Crack" cocaine, Marijuana   Comment: Last use of crack was today    Social History    Socioeconomic History  . Marital status: Single    Spouse name: Not on file  . Number of children: Not on file  . Years of education: Not on file  . Highest education level: Not on file  Occupational History  . Occupation: Disabled  Social Needs  . Financial resource strain: Not on file  . Food insecurity:    Worry: Not on file    Inability: Not on file  . Transportation needs:    Medical: Not on file    Non-medical: Not on file  Tobacco Use  . Smoking status: Current Every Day Smoker    Packs/day: 1.00    Years: 35.00    Pack years: 35.00    Types: Cigarettes  . Smokeless tobacco: Never Used  Substance and Sexual Activity  . Alcohol use: Yes    Comment: Beer and alcohol  . Drug use: Yes    Types: "Crack" cocaine, Marijuana    Comment: Last use of crack was today  . Sexual activity: Yes    Birth control/protection: None  Lifestyle  . Physical activity:    Days per week: Not on file    Minutes per session: Not on file  . Stress: Not on file  Relationships  . Social connections:    Talks on phone: Not on file    Gets together: Not on file    Attends religious service: Not on file    Active member of club or organization: Not on file    Attends  meetings of clubs or organizations: Not on file    Relationship status: Not on file  Other Topics Concern  . Not on file  Social History Narrative   Pt lives in [place] with mother.  On disability.  Stated that he was [facility], but has not been in a while.   Additional Social History:                         Sleep: Good  Appetite:  Fair  Current Medications: Current Facility-Administered Medications  Medication Dose Route Frequency Provider Last Rate Last Dose  . acetaminophen (TYLENOL) tablet 650 mg  650 mg Oral Q6H PRN Beau FannyWithrow, John C, FNP   650 mg at 04/06/18 0813  . alum & mag hydroxide-simeth (MAALOX/MYLANTA) 200-200-20 MG/5ML suspension 30 mL  30 mL Oral Q4H PRN Beau FannyWithrow, John C,  FNP   30 mL at 04/08/18 2010  . amantadine (SYMMETREL) capsule 100 mg  100 mg Oral BID Malvin JohnsFarah, Brian, MD   100 mg at 04/09/18 0816  . cloNIDine (CATAPRES) tablet 0.2 mg  0.2 mg Oral BID Malvin JohnsFarah, Brian, MD   0.2 mg at 04/09/18 0815  . feeding supplement (GLUCERNA SHAKE) (GLUCERNA SHAKE) liquid 237 mL  237 mL Oral BID BM Antonieta Pertlary, Lithzy Bernard Lawson, MD   237 mL at 04/09/18 0856  . gabapentin (NEURONTIN) capsule 100 mg  100 mg Oral TID Beau FannyWithrow, John C, FNP   100 mg at 04/09/18 0816  . hydrOXYzine (ATARAX/VISTARIL) tablet 25 mg  25 mg Oral Q6H PRN Beau FannyWithrow, John C, FNP   25 mg at 04/05/18 2123  . insulin aspart (novoLOG) injection 4 Units  4 Units Subcutaneous TID WC Beau FannyWithrow, John C, FNP   4 Units at 04/09/18 [phone]  . insulin glargine (LANTUS) injection 50 Units  50 Units Subcutaneous QHS Antonieta Pertlary, Reverie Vaquera Lawson, MD      . magnesium hydroxide (MILK OF MAGNESIA) suspension 30 mL  30 mL Oral Daily PRN Withrow, John C, FNP      . metFORMIN (GLUCOPHAGE) tablet 850 mg  850 mg Oral BID WC Antonieta Pertlary, Cinch Ormond Lawson, MD   850 mg at 04/09/18 0818  . multivitamin with minerals tablet 1 tablet  1 tablet Oral Daily Malvin JohnsFarah, Brian, MD   1 tablet at 04/09/18 0815  . risperiDONE (RISPERDAL) tablet 2 mg  2 mg Oral QHS Antonieta Pertlary, Coreena Rubalcava Lawson, MD      . thiamine (VITAMIN B-1) tablet 100 mg  100 mg Oral Daily Malvin JohnsFarah, Brian, MD   100 mg at 04/09/18 0817  . traZODone (DESYREL) tablet 50 mg  50 mg Oral QHS PRN Beau FannyWithrow, John C, FNP   50 mg at 04/08/18 2122    Lab Results:  Results for orders placed or performed during the hospital encounter of 04/05/18 (from the past 48 hour(s))  Glucose, capillary     Status: Abnormal   Collection Time: 04/07/18 12:06 PM  Result Value Ref Range   Glucose-Capillary 206 (H) 70 - 99 mg/dL   Comment 1 Notify RN    Comment 2 Document in Chart   Glucose, capillary     Status: Abnormal   Collection Time: 04/07/18  4:50 PM  Result Value Ref Range   Glucose-Capillary 270 (H) 70 - 99 mg/dL   Comment 1 Notify RN   Glucose,  capillary     Status: Abnormal   Collection Time: 04/07/18  8:59 PM  Result Value Ref Range   Glucose-Capillary 232 (H) 70 -  99 mg/dL  Glucose, capillary     Status: Abnormal   Collection Time: 04/08/18  5:57 AM  Result Value Ref Range   Glucose-Capillary 260 (H) 70 - 99 mg/dL   Comment 1 Notify RN    Comment 2 Document in Chart   Glucose, capillary     Status: Abnormal   Collection Time: 04/08/18 11:59 AM  Result Value Ref Range   Glucose-Capillary 226 (H) 70 - 99 mg/dL  Glucose, capillary     Status: Abnormal   Collection Time: 04/08/18  5:04 PM  Result Value Ref Range   Glucose-Capillary 286 (H) 70 - 99 mg/dL  Glucose, capillary     Status: Abnormal   Collection Time: 04/08/18  8:22 PM  Result Value Ref Range   Glucose-Capillary 254 (H) 70 - 99 mg/dL  Glucose, capillary     Status: Abnormal   Collection Time: 04/09/18  8:23 AM  Result Value Ref Range   Glucose-Capillary 389 (H) 70 - 99 mg/dL    Blood Alcohol level:  Lab Results  Component Value Date   ETH 72 (H) 04/05/2018   ETH <10 01/03/2018    Metabolic Disorder Labs: Lab Results  Component Value Date   HGBA1C 10.3 (H) 01/05/2018   MPG 249 01/05/2018   MPG 275 04/09/2016   Lab Results  Component Value Date   PROLACTIN 13.3 04/09/2016   PROLACTIN 74.0 (H) 06/03/2015   Lab Results  Component Value Date   CHOL 145 01/05/2018   TRIG 72 01/05/2018   HDL 45 01/05/2018   CHOLHDL 3.2 01/05/2018   VLDL 14 01/05/2018   LDLCALC 86 01/05/2018   LDLCALC 81 04/09/2016    Physical Findings: AIMS: Facial and Oral Movements Muscles of Facial Expression: None, normal Lips and Perioral Area: None, normal Jaw: None, normal Tongue: None, normal,Extremity Movements Upper (arms, wrists, hands, fingers): None, normal Lower (legs, knees, ankles, toes): None, normal, Trunk Movements Neck, shoulders, hips: None, normal, Overall Severity Severity of abnormal movements (highest score from questions above): None,  normal Incapacitation due to abnormal movements: None, normal Patient's awareness of abnormal movements (rate only patient's report): No Awareness, Dental Status Current problems with teeth and/or dentures?: No Does patient usually wear dentures?: No  CIWA:  CIWA-Ar Total: 0 COWS:  COWS Total Score: 0  Musculoskeletal: Strength & Muscle Tone: decreased Gait & Station: unsteady Patient leans: N/A  Psychiatric Specialty Exam: Physical Exam  Nursing note and vitals reviewed. Constitutional: He is oriented to person, place, and time. He appears well-developed and well-nourished.  HENT:  Head: Normocephalic and atraumatic.  Respiratory: Effort normal.  Neurological: He is alert and oriented to person, place, and time.    ROS  Blood pressure 125/69, pulse (!) 59, temperature 97.9 F (36.6 C), resp. rate 18, height 6\' 4"  (1.93 m), weight 93 kg, SpO2 100 %.Body mass index is 24.96 kg/m.  General Appearance: Disheveled  Eye Contact:  Fair  Speech:  Normal Rate  Volume:  Decreased  Mood:  Dysphoric  Affect:  Blunt  Thought Process:  Goal Directed and Descriptions of Associations: Intact  Orientation:  Full (Time, Place, and Person)  Thought Content:  Logical  Suicidal Thoughts:  No  Homicidal Thoughts:  No  Memory:  Immediate;   Fair Recent;   Fair Remote;   Fair  Judgement:  Intact  Insight:  Fair  Psychomotor Activity:  Decreased  Concentration:  Concentration: Fair and Attention Span: Fair  Recall:  [employer]Fair  Fund of Knowledge:  Fair  Language:  Fair  Akathisia:  Negative  Handed:  Right  AIMS (if indicated):     Assets:  Desire for Improvement Physical Health Resilience  ADL's:  Intact  Cognition:  WNL  Sleep:  Number of Hours: 6.5     Treatment Plan Summary: Daily contact with patient to assess and evaluate symptoms and progress in treatment, Medication management and Plan : Patient is seen and examined.  Patient is a 48 year old male with the above-stated past  psychiatric history and medical history who is seen in follow-up. Patient is seen and examined.  Patient is significantly oversedated today.  It was most likely secondary to the increase in the Risperdal.  I am going to reduce his dosage back to 2 mg p.o. nightly.  We will monitor how things go.  His blood sugar this morning remains elevated, so I am going to increase his Lantus to 50 units subcu nightly.  He will continue on the Metformin 850 mg p.o. twice daily. 1.  Reduce Risperdal 2 mg p.o. nightly for mood stability and psychosis. 2.  Increase Lantus insulin to 50 units subcu nightly for diabetes. Continue NovoLog insulin 4 units 3 times daily with meals 3.  Continue Metformin 850 mg p.o. twice daily for diabetes 4.  Patient is completed Librium taper for alcohol. 5.  Disposition planning-in progress.  Antonieta Pert, MD 04/09/2018, 10:35 AM

## 2018-04-09 NOTE — Progress Notes (Signed)
Pt cbg is 254.  Pt is non compliant on low carb diet and is eating snacks containing sugar.  Pt reminded of snacks he can have.  Pt denies SI. HI and AVH.  Pt verbally contracts for safety. Pt given support and encouragement. Pt remains safe on unit

## 2018-04-09 NOTE — Progress Notes (Signed)
D: Patient reminded multiple times to wear nonskid footwear and use walker. He is fairly non-compliant. He was observed walking in dayroom without the walker, not wearing nonskid footwear. A: Reminded patient again. R: Patient states "I will be sure to."

## 2018-04-09 NOTE — [facility] Group Notes (Signed)
[facility] LCSW Group Therapy Note  Date/Time:  04/09/2018  11:00AM-12:00PM  Type of Therapy and Topic:  Group Therapy:  Music and Mood  Participation Level:  Minimal   Description of Group: In this process group, members listened to a variety of genres of music and identified that different types of music evoke different responses.  Patients were encouraged to identify music that was soothing for them and music that was energizing for them.  Patients discussed how this knowledge can help with wellness and recovery in various ways including managing depression and anxiety as well as encouraging healthy sleep habits.    Therapeutic Goals: 1. Patients will explore the impact of different varieties of music on mood 2. Patients will verbalize the thoughts they have when listening to different types of music 3. Patients will identify music that is soothing to them as well as music that is energizing to them 4. Patients will discuss how to use this knowledge to assist in maintaining wellness and recovery 5. Patients will explore the use of music as a coping skill  Summary of Patient Progress:  At the beginning of group, patient was not present.  He entered the room for a few songs, made a request that was played, then left again.  Therapeutic Modalities: Solution Focused Brief Therapy Activity   Ambrose Mantle, LCSW

## 2018-04-10 LAB — GLUCOSE, CAPILLARY: Glucose-Capillary: 301 mg/dL — ABNORMAL HIGH (ref 70–99)

## 2018-04-10 MED ORDER — AMANTADINE HCL 100 MG PO CAPS: 100.0000 mg | ORAL_CAPSULE | Freq: Two times a day (BID) | ORAL | 11 refills | Status: DC

## 2018-04-10 MED ORDER — INSULIN GLARGINE 100 UNIT/ML ~~LOC~~ SOLN
40.0000 [IU] | Freq: Every day | SUBCUTANEOUS | Status: DC
  Filled 2018-04-10: qty 0.4

## 2018-04-10 MED ORDER — METFORMIN HCL 500 MG PO TABS
500.0000 mg | ORAL_TABLET | Freq: Two times a day (BID) | ORAL | Status: DC
  Filled 2018-04-10 (×4): qty 14

## 2018-04-10 MED ORDER — RISPERIDONE 2 MG PO TABS: 2.0000 mg | ORAL_TABLET | Freq: Every day | ORAL | 2 refills | Status: DC

## 2018-04-10 NOTE — [facility] Suicide Risk Assessment (Signed)
[facility] INPATIENT:  Family/Significant Other Suicide Prevention Education  Suicide Prevention Education:  Education Completed; mother, Steven Christensen [phone] has been identified by the patient as the family member/significant other with whom the patient will be residing, and identified as the person(s) who will aid the patient in the event of a mental health crisis (suicidal ideations/suicide attempt).  With written consent from the patient, the family member/significant other has been provided the following suicide prevention education, prior to the and/or following the discharge of the patient.  The suicide prevention education provided includes the following:  Suicide risk factors  Suicide prevention and interventions  National Suicide Hotline telephone number  [facility] assessment telephone number  [facility] Emergency Assistance 911  [facility] and/or Residential Mobile Crisis Unit telephone number  Request made of family/significant other to:  Remove weapons (e.g., guns, rifles, knives), all items previously/currently identified as safety concern.    Remove drugs/medications (over-the-counter, prescriptions, illicit drugs), all items previously/currently identified as a safety concern.  The family member/significant other verbalizes understanding of the suicide prevention education information provided.  The family member/significant other agrees to remove the items of safety concern listed above.  Mom is hoping patient will be accepted to [facility] and is aware of pending referral. No other concerns.   Steven Christensen 04/10/2018, 10:29 AM

## 2018-04-10 NOTE — Progress Notes (Signed)
Pt seems less sedated and more stable when ambulating.  Pt on unit and engaging with other patients.  Attends and participates in all groups and is med compliant.  Pt denies SI, HI and AVH and verbally contracts for safety.  Pt denies pain or discomfort. Pt signs 72 hour request for discharge at 17:32 on 04/09/17.  Pt continues to not be compliant with sugar intake and eats snacks that he should not be eating. Pt given meds as ordered.  Pt advised to avoid sugar snacks.  Pt receives BS coverage per order. Pt offered support and encouragement. Pt remains safe on unit

## 2018-04-10 NOTE — Progress Notes (Signed)
  [facility] Adult Case Management Discharge Plan :  Will you be returning to the same living situation after discharge:  Yes,  home with mother At discharge, do you have transportation home?: Yes,  mother is picking up at 4:30pm Do you have the ability to pay for your medications: Yes,  Medicaid  Release of information consent forms completed and in the chart  Patient to Follow up at: Follow-up Information    [facility]. Go on 04/13/2018.   Why:  Please attend your hospital discharge appointment on 04/13/18 at 8:30am.  Contact information: [address] [place] [address] [phone]        [facility] Follow up.   Why:  Referral faxed per patient's request. Referral being reviewed, follow up.  Contact information: [address] [place] [address] [phone]           Next level of care provider has access to [facility] Link:no  Safety Planning and Suicide Prevention discussed: Yes,  with mother  Have you used any form of tobacco in the last 30 days? (Cigarettes, Smokeless Tobacco, Cigars, and/or Pipes): Yes  Has patient been referred to the Quitline?: Patient refused referral  Patient has been referred for addiction treatment: Yes  [name], LCSWA 04/10/2018, 10:25 AM

## 2018-04-10 NOTE — Progress Notes (Signed)
CSW following up with weekend request for [facility] referral, as the patient was declined by [facility] for requiring insulin. CSW spoke with intake at [facility]; insulin is not a barrier to accepting patient and the facility will have beds tomorrow.  CSW faxed referral packet to [phone]  [facility] Authorization: [id number]    CSW anticipating discharge by tomorrow. The patient has a [facility] appt as back up.  Enid Cutter, LCSW-A Clinical Social Worker

## 2018-04-10 NOTE — [facility] Suicide Risk Assessment (Signed)
[facility] Discharge Suicide Risk Assessment   Principal Problem: Substance dependence and abuse/paranoia/ Discharge Diagnoses: Active Problems:   Alcohol abuse with alcohol-induced mood disorder (HCC)   Total Time spent with patient: 30 minutes  Alert and oriented to person place situation and time very adamant about discharge denying any paranoia and hallucinations denying any thoughts of harming self or others and contracting fully-though rehab is not fully range patient would rather go home first and take his chances with inpatient or outpatient rehab Mental Status Per Nursing Assessment::   On Admission:  Suicidal ideation indicated by patient, Suicide plan  Demographic Factors:  Male  Loss Factors: Decrease in vocational status  Historical Factors: NA  Risk Reduction Factors:   Resolution of psychosis Interested in follow-up and rehab Continued Clinical Symptoms:  Paranoia/probable drug-induced psychosis  Cognitive Features That Contribute To Risk:  None    Suicide Risk:  Minimal: No identifiable suicidal ideation.  Patients presenting with no risk factors but with morbid ruminations; may be classified as minimal risk based on the severity of the depressive symptoms  Follow-up Information    [facility]. Go on 04/13/2018.   Why:  Please attend your hospital discharge appointment on 04/13/18 at 8:30am.  Contact information: [address] [place] [address] [phone]        [facility] Follow up.   Why:  Referral faxed per patient's request. Referral being reviewed, follow up.  Contact information: [address] [place] [address] [phone]           Plan Of Care/Follow-up recommendations:  Activity:  full  Jonae Renshaw, MD 04/10/2018, 10:46 AM

## 2018-04-10 NOTE — Progress Notes (Signed)
Patient ID: Steven Christensen, male   DOB: Aug 01, 1970, 48 y.o.   MRN: [id number] Patient discharged to home/self care in the presence of family.  Upon discharge patient denies SI, HI and AVH.  Patient acknowledged understanding of all discharge instructions and receipt of personal belongings.

## 2018-04-10 NOTE — Discharge Summary (Signed)
Physician Discharge Summary Note  Patient:  Steven Christensen is an 48 y.o., male MRN:  [id number] DOB:  1970-09-20 Patient phone:  [phone] (home)  Patient address:   [address] [place] [address],  Total Time spent with patient: 30 minutes  Date of Admission:  04/05/2018 Date of Discharge: 04/10/18  Reason for Admission:   Mr. Godbolt is 48 years of age he presented on a voluntary basis reporting cluster of symptoms to include depression, auditory hallucinations, and intense paranoia that seem to be focused on drug dealers which may have had some basis in truth however he had become suicidal initially reporting he had a gun now telling me he has no access to firearms at any rate he has longstanding paranoia that apparently is highly exacerbated by cocaine abuse and dependency   Principal Problem: Depression/substance-induced mood disorder/cocaine abuse and dependency/alcohol abuse and dependency/history of schizoaffective disorder Discharge Diagnoses: Active Problems:   Alcohol abuse with alcohol-induced mood disorder (HCC)   Past Psychiatric History: Again chronic affective and psychotic symptoms and to what extent they are the result of chemical dependency is unknown but there is certainly a component, in fact he may be completely drug-induced psychosis/mood disorder  Past Medical History:  Past Medical History:  Diagnosis Date  . Anxiety   . Bipolar affective (HCC)   . Depression   . Diabetes mellitus without complication (HCC)   . Schizophrenia (HCC)   . Sleeping difficulties    History reviewed. No pertinent surgical history. Family History:  Family History  Problem Relation Age of Onset  . Diabetes Maternal Aunt   . Mental illness Neg Hx    Social History:  Social History   Substance and Sexual Activity  Alcohol Use Yes   Comment: Beer and alcohol     Social History   Substance and Sexual Activity  Drug Use Yes  . Types: "Crack" cocaine, Marijuana    Comment: Last use of crack was today    Social History   Socioeconomic History  . Marital status: Single    Spouse name: Not on file  . Number of children: Not on file  . Years of education: Not on file  . Highest education level: Not on file  Occupational History  . Occupation: Disabled  Social Needs  . Financial resource strain: Not on file  . Food insecurity:    Worry: Not on file    Inability: Not on file  . Transportation needs:    Medical: Not on file    Non-medical: Not on file  Tobacco Use  . Smoking status: Current Every Day Smoker    Packs/day: 1.00    Years: 35.00    Pack years: 35.00    Types: Cigarettes  . Smokeless tobacco: Never Used  Substance and Sexual Activity  . Alcohol use: Yes    Comment: Beer and alcohol  . Drug use: Yes    Types: "Crack" cocaine, Marijuana    Comment: Last use of crack was today  . Sexual activity: Yes    Birth control/protection: None  Lifestyle  . Physical activity:    Days per week: Not on file    Minutes per session: Not on file  . Stress: Not on file  Relationships  . Social connections:    Talks on phone: Not on file    Gets together: Not on file    Attends religious service: Not on file    Active member of club or organization: Not on file  Attends meetings of clubs or organizations: Not on file    Relationship status: Not on file  Other Topics Concern  . Not on file  Social History Narrative   Pt lives in [place] with mother.  On disability.  Stated that he was previously followed by [facility], but has not been in a while.    Hospital Course:   Once here the patient did indeed remain paranoid for period of time but eventually this paranoia did resolve with the institution and adjustment of Risperdal.  He was given a detox regimen that was also successful and in addition to this detox regimen was given amantadine for not only prevention of EPS but for cocaine cravings and he found all these measures very helpful  by the date of the 6 he was eager to get going he had no cravings tremors or withdrawal no thoughts of harming self or others and paranoia had affect dissipated onto mental status exam screening so we think he is ready to go but hopefully he will have rehab arranged at the point of discharge but is not fully ranged he would still like to go  Physical Findings: AIMS: Facial and Oral Movements Muscles of Facial Expression: None, normal Lips and Perioral Area: None, normal Jaw: None, normal Tongue: None, normal,Extremity Movements Upper (arms, wrists, hands, fingers): None, normal Lower (legs, knees, ankles, toes): None, normal, Trunk Movements Neck, shoulders, hips: None, normal, Overall Severity Severity of abnormal movements (highest score from questions above): None, normal Incapacitation due to abnormal movements: None, normal Patient's awareness of abnormal movements (rate only patient's report): No Awareness, Dental Status Current problems with teeth and/or dentures?: No Does patient usually wear dentures?: No  CIWA:  CIWA-Ar Total: 2 COWS:  COWS Total Score: 0  Musculoskeletal: Strength & Muscle Tone: within normal limits Gait & Station: normal Patient leans: N/A  Psychiatric Specialty Exam: Physical Exam  ROS  Blood pressure (!) 150/84, pulse 79, temperature 98.3 F (36.8 C), resp. rate 18, height 6\' 4"  (1.93 m), weight 93 kg, SpO2 100 %.Body mass index is 24.96 kg/m.  General Appearance: Casual  Eye Contact:  Fair  Speech:  Clear and Coherent  Volume:  Decreased  Mood:  Dysphoric  Affect:  Congruent  Thought Process:  Goal Directed  Orientation:  Full (Time, Place, and Person)  Thought Content:  Logical  Suicidal Thoughts:  No  Homicidal Thoughts:  No  Memory:  Immediate;   Good  Judgement:  Good  Insight:  Good  Psychomotor Activity:  Normal  Concentration:  Concentration: Good  Recall:  Good  Fund of Knowledge:  Good  Language:  Good  Akathisia:  Negative   Handed:  Right  AIMS (if indicated):     Assets:  Leisure Time Physical Health  ADL's:  Intact  Cognition:  WNL  Sleep:  Number of Hours: 6     Have you used any form of tobacco in the last 30 days? (Cigarettes, Smokeless Tobacco, Cigars, and/or Pipes): Yes  Has this patient used any form of tobacco in the last 30 days? (Cigarettes, Smokeless Tobacco, Cigars, and/or Pipes) Yes, No  Blood Alcohol level:  Lab Results  Component Value Date   ETH 72 (H) 04/05/2018   ETH <10 01/03/2018    Metabolic Disorder Labs:  Lab Results  Component Value Date   HGBA1C 10.3 (H) 01/05/2018   MPG 249 01/05/2018   MPG 275 04/09/2016   Lab Results  Component Value Date   PROLACTIN 13.3 04/09/2016  PROLACTIN 74.0 (H) 06/03/2015   Lab Results  Component Value Date   CHOL 145 01/05/2018   TRIG 72 01/05/2018   HDL 45 01/05/2018   CHOLHDL 3.2 01/05/2018   VLDL 14 01/05/2018   LDLCALC 86 01/05/2018   LDLCALC 81 04/09/2016    See Psychiatric Specialty Exam and Suicide Risk Assessment completed by Attending Physician prior to discharge.  Discharge destination:  Home  Is patient on multiple antipsychotic therapies at discharge:  No   Has Patient had three or more failed trials of antipsychotic monotherapy by history:  No  Recommended Plan for Multiple Antipsychotic Therapies: NA   Allergies as of 04/10/2018   No Known Allergies     Medication List    STOP taking these medications   ARIPiprazole 5 MG tablet Commonly known as:  ABILIFY   gabapentin 100 MG capsule Commonly known as:  NEURONTIN     TAKE these medications     Indication  amantadine 100 MG capsule Commonly known as:  SYMMETREL Take 1 capsule (100 mg total) by mouth 2 (two) times daily.  Indication:  Extrapyramidal Reaction caused by Medications   hydrOXYzine 25 MG tablet Commonly known as:  ATARAX/VISTARIL Take 1 tablet (25 mg total) by mouth every 6 (six) hours as needed for anxiety.  Indication:  Feeling  Anxious   insulin aspart 100 UNIT/ML injection Commonly known as:  novoLOG Inject 4 Units into the skin 3 (three) times daily with meals.  Indication:  Type 2 Diabetes   insulin glargine 100 UNIT/ML injection Commonly known as:  LANTUS Inject 0.4 mLs (40 Units total) into the skin every morning.  Indication:  Type 2 Diabetes   metFORMIN 500 MG tablet Commonly known as:  GLUCOPHAGE Take 1 tablet (500 mg total) by mouth 2 (two) times daily with a meal.  Indication:  Type 2 Diabetes   risperiDONE 2 MG tablet Commonly known as:  RISPERDAL Take 1 tablet (2 mg total) by mouth at bedtime.  Indication:  Manic-Depression   traZODone 50 MG tablet Commonly known as:  DESYREL Take 1 tablet (50 mg total) by mouth at bedtime as needed for sleep.  Indication:  Trouble Sleeping      Follow-up [employer]    [facility]. Go on 04/13/2018.   Why:  Please attend your hospital discharge appointment on 04/13/18 at 8:30am.  Contact information: [address] [place] [address] [phone]        [facility] Follow up.   Why:  Referral faxed per patient's request. Referral being reviewed, follow up.  Contact information: [address] [place] [address] [phone]           Follow-up recommendations:  Activity:  full  SignedMalvin Johns: Siena Poehler, MD 04/10/2018, 10:51 AM

## 2018-04-10 NOTE — Progress Notes (Signed)
Recreation Therapy Notes  Date: 1.6.20 Time: 1000 Location: [address] Dayroom  Group Topic: Coping Skills  Goal Area(s) Addresses:  Pt will be able to identify positive coping skills. Pt will be able to identify benefits of using positive coping skills. Pt will be able to identify benefits of using coping skills post d/c.  Behavioral Response: Engaged  Intervention: Worksheet  Activity: Coping Skills A to Z.  Patients worked together to Licensed conveyanceridentify coping skills for each letter of the alphabet.   Education: PharmacologistCoping Skills, Building control surveyorDischarge Planning.   Education Outcome: Acknowledges understanding/In group clarification offered/Needs additional education.   Clinical Observations/Feedback: Pt arrived a little late to group but joined right in the group session.  Pt expressed his some of his coping skills as xbox, sleeping, working and laughing.    Caroll RancherMarjette Sarayu Prevost, LRT/CTRS      Caroll RancherLindsay, Iwalani Templeton A 04/10/2018 11:39 AM

## 2018-10-24 ENCOUNTER — Emergency Department (HOSPITAL_COMMUNITY)
Admission: EM | Admit: 2018-10-24 | Discharge: 2018-10-24 | Disposition: A | Discharge status: Home / Self Care | Payer: Medicaid Other | Attending: Emergency Medicine | Admitting: Emergency Medicine

## 2018-10-24 ENCOUNTER — Encounter (HOSPITAL_COMMUNITY): Payer: Self-pay | Admitting: Registered Nurse

## 2018-10-24 DIAGNOSIS — Z046 Encounter for general psychiatric examination, requested by authority: Secondary | ICD-10-CM | POA: Insufficient documentation

## 2018-10-24 DIAGNOSIS — R440 Auditory hallucinations: Secondary | ICD-10-CM | POA: Diagnosis not present

## 2018-10-24 DIAGNOSIS — F17210 Nicotine dependence, cigarettes, uncomplicated: Secondary | ICD-10-CM | POA: Diagnosis not present

## 2018-10-24 DIAGNOSIS — E119 Type 2 diabetes mellitus without complications: Secondary | ICD-10-CM | POA: Insufficient documentation

## 2018-10-24 DIAGNOSIS — R45851 Suicidal ideations: Secondary | ICD-10-CM

## 2018-10-24 DIAGNOSIS — F1290 Cannabis use, unspecified, uncomplicated: Secondary | ICD-10-CM | POA: Diagnosis not present

## 2018-10-24 DIAGNOSIS — Z23 Encounter for immunization: Secondary | ICD-10-CM | POA: Insufficient documentation

## 2018-10-24 DIAGNOSIS — F1490 Cocaine use, unspecified, uncomplicated: Secondary | ICD-10-CM | POA: Diagnosis not present

## 2018-10-24 DIAGNOSIS — F250 Schizoaffective disorder, bipolar type: Secondary | ICD-10-CM | POA: Diagnosis not present

## 2018-10-24 DIAGNOSIS — Z794 Long term (current) use of insulin: Secondary | ICD-10-CM | POA: Diagnosis not present

## 2018-10-24 DIAGNOSIS — Z79899 Other long term (current) drug therapy: Secondary | ICD-10-CM | POA: Insufficient documentation

## 2018-10-24 LAB — CBC WITH DIFFERENTIAL/PLATELET
Abs Immature Granulocytes: 0.02 10*3/uL (ref 0.00–0.07)
Basophils Absolute: 0 10*3/uL (ref 0.0–0.1)
Basophils Relative: 0 %
Eosinophils Absolute: 0.1 10*3/uL (ref 0.0–0.5)
Eosinophils Relative: 1 %
HCT: 49.4 % (ref 39.0–52.0)
Hemoglobin: 16.2 g/dL (ref 13.0–17.0)
Immature Granulocytes: 0 %
Lymphocytes Relative: 30 %
Lymphs Abs: 2.7 10*3/uL (ref 0.7–4.0)
MCH: 31.3 pg (ref 26.0–34.0)
MCHC: 32.8 g/dL (ref 30.0–36.0)
MCV: 95.6 fL (ref 80.0–100.0)
Monocytes Absolute: 0.6 10*3/uL (ref 0.1–1.0)
Monocytes Relative: 7 %
Neutro Abs: 5.7 10*3/uL (ref 1.7–7.7)
Neutrophils Relative %: 62 %
Platelets: 166 10*3/uL (ref 150–400)
RBC: 5.17 MIL/uL (ref 4.22–5.81)
RDW: 14.7 % (ref 11.5–15.5)
WBC: 9.2 10*3/uL (ref 4.0–10.5)
nRBC: 0 % (ref 0.0–0.2)

## 2018-10-24 LAB — COMPREHENSIVE METABOLIC PANEL
ALT: 29 U/L (ref 0–44)
AST: 23 U/L (ref 15–41)
Albumin: 4.1 g/dL (ref 3.5–5.0)
Alkaline Phosphatase: 93 U/L (ref 38–126)
Anion gap: 12 (ref 5–15)
BUN: 11 mg/dL (ref 6–20)
CO2: 23 mmol/L (ref 22–32)
Calcium: 9.1 mg/dL (ref 8.9–10.3)
Chloride: 107 mmol/L (ref 98–111)
Creatinine, Ser: 0.68 mg/dL (ref 0.61–1.24)
GFR calc Af Amer: >60 mL/min (ref >60)
GFR calc non Af Amer: >60 mL/min (ref >60)
Glucose, Bld: 236 mg/dL — ABNORMAL HIGH (ref 70–99)
Potassium: 3.8 mmol/L (ref 3.5–5.1)
Sodium: 142 mmol/L (ref 135–145)
Total Bilirubin: 0.4 mg/dL (ref 0.3–1.2)
Total Protein: 7.5 g/dL (ref 6.5–8.1)

## 2018-10-24 LAB — RAPID URINE DRUG SCREEN, HOSP PERFORMED
Amphetamines: NOT DETECTED
Barbiturates: NOT DETECTED
Benzodiazepines: NOT DETECTED
Cocaine: POSITIVE — AB
Opiates: NOT DETECTED
Tetrahydrocannabinol: POSITIVE — AB

## 2018-10-24 LAB — ETHANOL: Alcohol, Ethyl (B): <10 mg/dL (ref <10)

## 2018-10-24 MED ORDER — ONDANSETRON HCL 4 MG PO TABS: 4.0000 mg | ORAL_TABLET | Freq: Three times a day (TID) | ORAL | Status: DC | PRN

## 2018-10-24 MED ORDER — TETANUS-DIPHTH-ACELL PERTUSSIS 5-2.5-18.5 LF-MCG/0.5 IM SUSP
0.5000 mL | Freq: Once | INTRAMUSCULAR | Status: AC
  Administered 2018-10-24: 0.5 mL via INTRAMUSCULAR
  Filled 2018-10-24: qty 0.5

## 2018-10-24 MED ORDER — ACETAMINOPHEN 325 MG PO TABS: 650.0000 mg | ORAL_TABLET | ORAL | Status: DC | PRN

## 2018-10-24 MED ORDER — LORAZEPAM 1 MG PO TABS: 0.0000 mg | ORAL_TABLET | Freq: Two times a day (BID) | ORAL | Status: DC

## 2018-10-24 MED ORDER — RISPERIDONE 2 MG PO TABS: 2.0000 mg | ORAL_TABLET | Freq: Every day | ORAL | Status: DC

## 2018-10-24 MED ORDER — VITAMIN B-1 100 MG PO TABS
100.0000 mg | ORAL_TABLET | Freq: Every day | ORAL | Status: DC
  Administered 2018-10-24: 100 mg via ORAL
  Filled 2018-10-24: qty 1

## 2018-10-24 MED ORDER — LORAZEPAM 1 MG PO TABS: 0.0000 mg | ORAL_TABLET | Freq: Four times a day (QID) | ORAL | Status: DC

## 2018-10-24 MED ORDER — NICOTINE 21 MG/24HR TD PT24: 21.0000 mg | MEDICATED_PATCH | Freq: Every day | TRANSDERMAL | Status: DC

## 2018-10-24 MED ORDER — LORAZEPAM 2 MG/ML IJ SOLN: 0.0000 mg | Freq: Two times a day (BID) | INTRAMUSCULAR | Status: DC

## 2018-10-24 MED ORDER — METFORMIN HCL 500 MG PO TABS
500.0000 mg | ORAL_TABLET | Freq: Two times a day (BID) | ORAL | Status: DC
  Administered 2018-10-24: 500 mg via ORAL
  Filled 2018-10-24: qty 1

## 2018-10-24 MED ORDER — THIAMINE HCL 100 MG/ML IJ SOLN: 100.0000 mg | Freq: Every day | INTRAMUSCULAR | Status: DC

## 2018-10-24 MED ORDER — LORAZEPAM 2 MG/ML IJ SOLN: 0.0000 mg | Freq: Four times a day (QID) | INTRAMUSCULAR | Status: DC

## 2018-10-24 MED ORDER — AMANTADINE HCL 100 MG PO CAPS: 100.0000 mg | ORAL_CAPSULE | Freq: Two times a day (BID) | ORAL | Status: DC

## 2018-10-24 NOTE — ED Notes (Signed)
Pt is calm and cooperative.  Pt is willing to go to outpatient treatment for drugs and alcohol.

## 2018-10-24 NOTE — ED Notes (Signed)
Pt discharged safely with discharge instructions.  All belongings were returned to pt. 
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
## 2020-04-25 ENCOUNTER — Other Ambulatory Visit: Payer: Self-pay | Admitting: Internal Medicine

## 2020-04-26 LAB — CBC
HCT: 43.8 % (ref 38.5–50.0)
Hemoglobin: 15 g/dL (ref 13.2–17.1)
MCH: 31.2 pg (ref 27.0–33.0)
MCHC: 34.2 g/dL (ref 32.0–36.0)
MCV: 91.1 fL (ref 80.0–100.0)
MPV: 10.3 fL (ref 7.5–12.5)
Platelets: 240 10*3/uL (ref 140–400)
RBC: 4.81 10*6/uL (ref 4.20–5.80)
RDW: 12.9 % (ref 11.0–15.0)
WBC: 6.6 10*3/uL (ref 3.8–10.8)

## 2020-04-26 LAB — LIPID PANEL
Cholesterol: 130 mg/dL (ref <200)
HDL: 45 mg/dL (ref >=40)
LDL Cholesterol (Calc): 72 mg/dL (calc)
Non-HDL Cholesterol (Calc): 85 mg/dL (calc) (ref <130)
Total CHOL/HDL Ratio: 2.9 (calc) (ref <5.0)
Triglycerides: 58 mg/dL (ref <150)

## 2020-04-26 LAB — COMPLETE METABOLIC PANEL WITH GFR
AG Ratio: 1.4 (calc) (ref 1.0–2.5)
ALT: 20 U/L (ref 9–46)
AST: 20 U/L (ref 10–35)
Albumin: 4 g/dL (ref 3.6–5.1)
Alkaline phosphatase (APISO): 95 U/L (ref 35–144)
BUN: 7 mg/dL (ref 7–25)
CO2: 26 mmol/L (ref 20–32)
Calcium: 8.9 mg/dL (ref 8.6–10.3)
Chloride: 105 mmol/L (ref 98–110)
Creat: 0.77 mg/dL (ref 0.70–1.33)
GFR, Est African American: 121 mL/min/{1.73_m2} (ref >=60)
GFR, Est Non African American: 104 mL/min/{1.73_m2} (ref >=60)
Globulin: 2.8 g/dL (calc) (ref 1.9–3.7)
Glucose, Bld: 233 mg/dL — ABNORMAL HIGH (ref 65–99)
Potassium: 4.3 mmol/L (ref 3.5–5.3)
Sodium: 141 mmol/L (ref 135–146)
Total Bilirubin: 0.5 mg/dL (ref 0.2–1.2)
Total Protein: 6.8 g/dL (ref 6.1–8.1)

## 2020-04-26 LAB — PSA: PSA: 0.37 ng/mL (ref <=4.0)

## 2020-04-26 LAB — TSH: TSH: 0.95 mIU/L (ref 0.40–4.50)

## 2020-04-26 LAB — VITAMIN D 25 HYDROXY (VIT D DEFICIENCY, FRACTURES): Vit D, 25-Hydroxy: 20 ng/mL — ABNORMAL LOW (ref 30–100)

## 2020-05-13 ENCOUNTER — Ambulatory Visit: Payer: Medicaid Other | Admitting: Podiatrist

## 2020-05-20 ENCOUNTER — Ambulatory Visit (INDEPENDENT_AMBULATORY_CARE_PROVIDER_SITE_OTHER): Payer: Medicaid Other | Admitting: Podiatry

## 2020-05-20 ENCOUNTER — Ambulatory Visit: Payer: Medicaid Other | Admitting: Podiatry

## 2020-05-20 ENCOUNTER — Other Ambulatory Visit: Payer: Self-pay

## 2020-05-20 DIAGNOSIS — Z5329 Procedure and treatment not carried out because of patient's decision for other reasons: Secondary | ICD-10-CM

## 2020-05-20 NOTE — Progress Notes (Signed)
Patient left without being seen.

## 2020-05-23 ENCOUNTER — Other Ambulatory Visit: Payer: Self-pay

## 2020-05-23 ENCOUNTER — Ambulatory Visit (HOSPITAL_COMMUNITY)
Admission: EM | Admit: 2020-05-23 | Discharge: 2020-05-25 | Disposition: A | Discharge status: Home / Self Care | Payer: Medicaid Other | Attending: Nurse Practitioner | Admitting: Nurse Practitioner

## 2020-05-23 DIAGNOSIS — Z79899 Other long term (current) drug therapy: Secondary | ICD-10-CM | POA: Insufficient documentation

## 2020-05-23 DIAGNOSIS — G4700 Insomnia, unspecified: Secondary | ICD-10-CM | POA: Insufficient documentation

## 2020-05-23 DIAGNOSIS — R440 Auditory hallucinations: Secondary | ICD-10-CM

## 2020-05-23 DIAGNOSIS — Z20822 Contact with and (suspected) exposure to covid-19: Secondary | ICD-10-CM | POA: Insufficient documentation

## 2020-05-23 DIAGNOSIS — F1020 Alcohol dependence, uncomplicated: Secondary | ICD-10-CM | POA: Insufficient documentation

## 2020-05-23 DIAGNOSIS — Z9151 Personal history of suicidal behavior: Secondary | ICD-10-CM | POA: Insufficient documentation

## 2020-05-23 DIAGNOSIS — F1290 Cannabis use, unspecified, uncomplicated: Secondary | ICD-10-CM | POA: Insufficient documentation

## 2020-05-23 DIAGNOSIS — R450 Nervousness: Secondary | ICD-10-CM | POA: Insufficient documentation

## 2020-05-23 DIAGNOSIS — F32A Depression, unspecified: Secondary | ICD-10-CM | POA: Insufficient documentation

## 2020-05-23 DIAGNOSIS — L089 Local infection of the skin and subcutaneous tissue, unspecified: Secondary | ICD-10-CM | POA: Insufficient documentation

## 2020-05-23 DIAGNOSIS — R45851 Suicidal ideations: Secondary | ICD-10-CM | POA: Insufficient documentation

## 2020-05-23 DIAGNOSIS — F1420 Cocaine dependence, uncomplicated: Secondary | ICD-10-CM | POA: Insufficient documentation

## 2020-05-23 DIAGNOSIS — F200 Paranoid schizophrenia: Secondary | ICD-10-CM | POA: Diagnosis not present

## 2020-05-23 DIAGNOSIS — F17210 Nicotine dependence, cigarettes, uncomplicated: Secondary | ICD-10-CM | POA: Insufficient documentation

## 2020-05-23 DIAGNOSIS — F419 Anxiety disorder, unspecified: Secondary | ICD-10-CM | POA: Insufficient documentation

## 2020-05-23 LAB — POC SARS CORONAVIRUS 2 AG: SARS Coronavirus 2 Ag: NEGATIVE

## 2020-05-23 LAB — POCT URINE DRUG SCREEN - MANUAL ENTRY (I-SCREEN)
POC Amphetamine UR: NOT DETECTED
POC Buprenorphine (BUP): NOT DETECTED
POC Cocaine UR: NOT DETECTED
POC Marijuana UR: NOT DETECTED
POC Methadone UR: NOT DETECTED
POC Methamphetamine UR: NOT DETECTED
POC Morphine: NOT DETECTED
POC Oxazepam (BZO): NOT DETECTED
POC Oxycodone UR: NOT DETECTED
POC Secobarbital (BAR): NOT DETECTED

## 2020-05-23 LAB — POC SARS CORONAVIRUS 2 AG -  ED: SARS Coronavirus 2 Ag: NEGATIVE

## 2020-05-23 MED ORDER — MAGNESIUM HYDROXIDE 400 MG/5ML PO SUSP: 30.0000 mL | Freq: Every day | ORAL | Status: DC | PRN

## 2020-05-23 MED ORDER — HYDROXYZINE HCL 25 MG PO TABS
25.0000 mg | ORAL_TABLET | Freq: Three times a day (TID) | ORAL | Status: DC | PRN
  Administered 2020-05-24 (×2): 25 mg via ORAL
  Filled 2020-05-23 (×2): qty 1

## 2020-05-23 MED ORDER — TRAZODONE HCL 50 MG PO TABS
50.0000 mg | ORAL_TABLET | Freq: Every evening | ORAL | Status: DC | PRN
  Administered 2020-05-24 (×2): 50 mg via ORAL
  Filled 2020-05-23 (×2): qty 1

## 2020-05-23 MED ORDER — ALUM & MAG HYDROXIDE-SIMETH 200-200-20 MG/5ML PO SUSP: 30.0000 mL | ORAL | Status: DC | PRN

## 2020-05-23 MED ORDER — ACETAMINOPHEN 325 MG PO TABS: 650.0000 mg | ORAL_TABLET | Freq: Four times a day (QID) | ORAL | Status: DC | PRN

## 2020-05-23 NOTE — ED Notes (Signed)
[facility] called for STAT labs
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
## 2020-05-23 NOTE — ED Notes (Signed)
Pt has rash on inner thighs, groin, and scrotum. He right big toehas a sore that is black and cellulitis on foot. Awaiting NP to see pt

## 2020-05-23 NOTE — ED Provider Notes (Signed)
Behavioral Health Admission H&P [facility] & OBS)  Date: 05/24/20 Patient Name: Steven Christensen MRN: [id number] Chief Complaint:  Chief Complaint  Patient presents with  . Hallucinations  . Paranoid  . Suicidal   Chief Complaint/Presenting Problem: Pt is actively hallucinating, passive suicidal ideations and paranoid someone is going to kill him and his family.  Diagnoses:  Final diagnoses:  Schizophrenia, paranoid (HCC)  Alcohol use disorder, moderate, dependence (HCC)  Toe infection    HPI: Steven Christensen is a 50 year old male with a history of schizophrenia, bipolar disorder, cocaine use disorder, and alcohol use disorder who presents to [facility] voluntarily with law enforcement due to paranoia.  On evaluation, patient is alert and oriented x4.  He is pleasant and cooperative.  His speech is clear and coherent, normal pace, normal volume.  He reports feeling anxious and depressed due to people trying to kill him.  Patient states "somebody is trying to kill me.  I have been getting death threats.  I get them mostly when I am at my mom's house.  A gang is telling me that I gave their niece AIDS and they are going to kill me.  They see this rash on me when I am in the shower and think I have AIDS.  They are threatening to kill my daughter, my baby mama, and my mother.  I can hear them clear as a bell when I am inside the house.  I see people outside the house that tell me they are going to kill me."  Patient denies suicidal ideations.  He states that he has had suicidal ideations in the past and has attempted suicide once several years ago. He denies homicidal ideations.  Patient reports that he drinks 3 to 4 (40 ounce) beers about every other day.  Reports last drink was yesterday.  He states that he sometimes uses marijuana.  He states that he has a history of cocaine addiction.  He reports he last used crack cocaine about 3 weeks ago.  He denies use of other substances.  His UDS was negative.    Patient reports that he was followed by [facility], but he has not had an appointment in over a year.  He states that he has not taken medications in over a year.  He does not recall the names of the medications that he was taking. He was last inpatient at [facility] 04/2018. He was discharged on amantadine 100 mg BID, hydroxyzine 25 mg every 6 hours prn, risperidone 2 mg QHS, and trazodone 50 mg QHS.  Reports that he takes Lantus and Glucophage for diabetes.   Patient reports infection of right great toe. Nail bed is slightly erythematous. Noted small amount of purulent drainage. Nail does not appear to be embedded in surrounding tissue. There is a callous that surrounds the upper portion of the toe and an area of eschar that is 1-2 mm in circumference. There are no wounds noted on other areas of foot/toes. Feet are warm to touch. Cap refill less than 3 seconds. Plan to start bactrim.   Patient reports that he had an appointment with [facility] but left because there was a long wait.   Patient encouraged to follow up with PCP and Podiatry after discharge.   Patient reports rash in groin area and lower abdomen. States that the area itches. Skin appears dry and slightly red in areas. Appears to be contact dermatitis. States that his PCP recently prescribed triamcinolone.   PHQ 2-9:  Flowsheet Row [facility] Most recent reading at 04/05/2018  4:00 PM ED from 04/05/2018 in [facility] Most recent reading at 04/05/2018  8:58 AM Admission (Discharged) [facility] Most recent reading at 01/03/2018  6:00 PM  C-SSRS RISK CATEGORY High Risk High Risk High Risk       Total Time spent with patient: 30 minutes  Musculoskeletal  Strength & Muscle Tone: within normal limits Gait & Station: normal Patient leans: N/A  Psychiatric Specialty Exam   Presentation General Appearance: Fairly Groomed  Eye Contact:Fair  Speech:Clear and Coherent; Normal Rate  Speech Volume:Normal  Handedness:Right   Mood and Affect  Mood:Depressed; Anxious  Affect:Congruent   Thought Process  Thought Processes:Coherent  Descriptions of Associations:Intact  Orientation:Full (Time, Place and Person)  Thought Content:Paranoid Ideation  Hallucinations:Hallucinations: Auditory; Visual Description of Auditory Hallucinations: voices tell him that they are going to kill his family Description of Visual Hallucinations: states that he sees people outside that are threateningto kill his family  Ideas of Reference:Paranoia  Suicidal Thoughts:Suicidal Thoughts: No  Homicidal Thoughts:Homicidal Thoughts: No   Sensorium  Memory:Immediate Good; Recent Good; Remote Good  Judgment:Intact  Insight:Fair   Executive Functions  Concentration:Fair  Attention Span:Fair  Recall:Good  Fund of Knowledge:Good  Language:Good   Psychomotor Activity  Psychomotor Activity:Psychomotor Activity: Normal   Assets  Assets:Communication Skills; Desire for Improvement; Financial Resources/Insurance; Housing; Physical Health; Resilience; Social Support   Sleep  Sleep:Sleep: Fair   Physical Exam Constitutional:      General: He is not in acute distress.    Appearance: He is not ill-appearing, toxic-appearing or diaphoretic.  HENT:     Head: Normocephalic.     Right Ear: External ear normal.     Left Ear: External ear normal.  Eyes:     Pupils: Pupils are equal, round, and reactive to light.  Cardiovascular:     Rate and Rhythm: Normal rate.  Pulmonary:     Effort: Pulmonary effort is normal. No respiratory distress.  Musculoskeletal:        General: Normal range of motion.  Skin:    General: Skin is warm and dry.     Findings: No rash (patient reports rash in groin area and lower abdomen. States that the area itches. Skin appears dry  and slightly red in areas. Appears to be contact dermatitis. States that his PCP recently prescribed triamcinolone.  ).     Comments: Patient reports infection of right great toe. Nail bed is slightly erythematous. Noted small amount of purulent drainage. Nail does not appear to be embedded in surrounding tissue. There is a callous that surrounds the upper portion of the toe and an area of eschar that is 1-2 mm in circumference. There are no wounds noted on other areas of foot/toes. Feet are warm to touch. Cap refill less than 3 seconds.   Patient reports that he had an appointment with [facility] but left because there was a long wait.   Patient encouraged to follow up with PCP and Podiatry after discharge.   Neurological:     Mental Status: He is alert and oriented to person, place, and time.  Psychiatric:        Mood and Affect: Mood is anxious and depressed.        Speech: Speech normal.        Behavior: Behavior is cooperative.  Thought Content: Thought content is paranoid. Thought content does not include homicidal or suicidal ideation. Thought content does not include suicidal plan.    Review of Systems  Constitutional: Negative for chills, diaphoresis, fever, malaise/fatigue and weight loss.  HENT: Negative for congestion.   Respiratory: Negative for cough and shortness of breath.   Cardiovascular: Negative for chest pain and palpitations.  Gastrointestinal: Negative for diarrhea, nausea and vomiting.  Neurological: Negative for dizziness and seizures.  Psychiatric/Behavioral: Positive for depression, hallucinations and substance abuse. Negative for memory loss and suicidal ideas. The patient is nervous/anxious and has insomnia.   All other systems reviewed and are negative.   Blood pressure (!) 168/92, pulse 85, temperature 98.6 F (37 C), temperature source Temporal, resp. rate 20, height 6\' 4"  (1.93 m), weight 200 lb (90.7 kg), SpO2 99 %. Body mass index is 24.34  kg/m.  Past Psychiatric History: Schizophrenia, Substance induced mood disorder, [facility], [facility]/2018, 04/2016.   Is the patient at risk to self? No  Has the patient been a risk to self in the past 6 months? No .    Has the patient been a risk to self within the distant past? Yes   Is the patient a risk to others? No   Has the patient been a risk to others in the past 6 months? No   Has the patient been a risk to others within the distant past? No   Past Medical History:  Past Medical History:  Diagnosis Date  . Anxiety   . Bipolar affective (HCC)   . Depression   . Diabetes mellitus without complication (HCC)   . Schizophrenia (HCC)   . Sleeping difficulties    No past surgical history on file.  Family History:  Family History  Problem Relation Age of Onset  . Diabetes Maternal Aunt   . Mental illness Neg Hx     Social History:  Social History   Socioeconomic History  . Marital status: Single    Spouse name: Not on file  . Number of children: Not on file  . Years of education: Not on file  . Highest education level: Not on file  Occupational History  . Occupation: Disabled  Tobacco Use  . Smoking status: Current Every Day Smoker    Packs/day: 1.00    Years: 35.00    Pack years: 35.00    Types: Cigarettes  . Smokeless tobacco: Never Used  Substance and Sexual Activity  . Alcohol use: Yes    Comment: Beer and alcohol  . Drug use: Yes    Types: "Crack" cocaine, Marijuana    Comment: Last use of crack was today  . Sexual activity: Yes    Birth control/protection: None  Other Topics Concern  . Not on file  Social History Narrative   Pt lives in [place] with mother.  On disability.  Stated that he was [facility], but has not been in a while.   Social Determinants of Health   Financial Resource Strain: Not on file  Food Insecurity: Not on file  Transportation Needs: Not on file  Physical Activity: Not on file  Stress: Not  on file  Social Connections: Not on file  Intimate Partner Violence: Not on file    SDOH:  SDOH Screenings   Alcohol Screen: Not on file  Depression (NWG9-5(PHQ2-9): Not on file  Financial Resource Strain: Not on file  Food Insecurity: Not on file  Housing: Not on file  Physical Activity:  Not on file  Social Connections: Not on file  Stress: Not on file  Tobacco Use: Not on file  Transportation Needs: Not on file    Last Labs:  Admission on 05/23/2020  Component Date Value Ref Range Status  . SARS Coronavirus 2 by RT PCR 05/23/2020 NEGATIVE  NEGATIVE Final   Comment: (NOTE) SARS-CoV-2 target nucleic acids are NOT DETECTED.  The SARS-CoV-2 RNA is generally detectable in upper respiratory specimens during the acute phase of infection. The lowest concentration of SARS-CoV-2 viral copies this assay can detect is 138 copies/mL. A negative result does not preclude SARS-Cov-2 infection and should not be used as the sole basis for treatment or other patient management decisions. A negative result may occur with  improper specimen collection/handling, submission of specimen other than nasopharyngeal swab, presence of viral mutation(s) within the areas targeted by this assay, and inadequate number of viral copies(<138 copies/mL). A negative result must be combined with clinical observations, patient history, and epidemiological information. The expected result is Negative.  Fact Sheet for Patients:  BloggerCourse.com  Fact Sheet for Healthcare Providers:  SeriousBroker.it  This test is no                          t yet approved or cleared by the [place] FDA and  has been authorized for detection and/or diagnosis of SARS-CoV-2 by FDA under an Emergency Use Authorization (EUA). This EUA will remain  in effect (meaning this test can be used) for the duration of the COVID-19 declaration under Section 564(b)(1) of the Act,  21 U.S.C.section 360bbb-3(b)(1), unless the authorization is terminated  or revoked sooner.      . Influenza A by PCR 05/23/2020 NEGATIVE  NEGATIVE Final  . Influenza B by PCR 05/23/2020 NEGATIVE  NEGATIVE Final   Comment: (NOTE) The Xpert Xpress SARS-CoV-2/FLU/RSV plus assay is intended as an aid in the diagnosis of influenza from Nasopharyngeal swab specimens and should not be used as a sole basis for treatment. Nasal washings and aspirates are unacceptable for Xpert Xpress SARS-CoV-2/FLU/RSV testing.  Fact Sheet for Patients: BloggerCourse.com  Fact Sheet for Healthcare Providers: SeriousBroker.it  This test is not yet approved or cleared by the [place] FDA and has been authorized for detection and/or diagnosis of SARS-CoV-2 by FDA under an Emergency Use Authorization (EUA). This EUA will remain in effect (meaning this test can be used) for the duration of the COVID-19 declaration under Section 564(b)(1) of the Act, 21 U.S.C. section 360bbb-3(b)(1), unless the authorization is terminated or revoked.  Performed at [facility], [address], [place], [place] [address]   . SARS Coronavirus 2 Ag 05/23/2020 Negative  Negative Final  . WBC 05/23/2020 10.9* 4.0 - 10.5 K/uL Final  . RBC 05/23/2020 5.13  4.22 - 5.81 MIL/uL Final  . Hemoglobin 05/23/2020 16.1  13.0 - 17.0 g/dL Final  . HCT 88/41/6606 47.8  39.0 - 52.0 % Final  . MCV 05/23/2020 93.2  80.0 - 100.0 fL Final  . MCH 05/23/2020 31.4  26.0 - 34.0 pg Final  . MCHC 05/23/2020 33.7  30.0 - 36.0 g/dL Final  . RDW 30/16/0109 14.2  11.5 - 15.5 % Final  . Platelets 05/23/2020 232  150 - 400 K/uL Final  . nRBC 05/23/2020 0.0  0.0 - 0.2 % Final  . Neutrophils Relative % 05/23/2020 67  % Final  . Neutro Abs 05/23/2020 7.3  1.7 - 7.7 K/uL Final  .  Lymphocytes Relative 05/23/2020 27  % Final  . Lymphs Abs 05/23/2020 2.9  0.7 - 4.0 K/uL Final  . Monocytes Relative  05/23/2020 5  % Final  . Monocytes Absolute 05/23/2020 0.6  0.1 - 1.0 K/uL Final  . Eosinophils Relative 05/23/2020 1  % Final  . Eosinophils Absolute 05/23/2020 0.1  0.0 - 0.5 K/uL Final  . Basophils Relative 05/23/2020 0  % Final  . Basophils Absolute 05/23/2020 0.0  0.0 - 0.1 K/uL Final  . Immature Granulocytes 05/23/2020 0  % Final  . Abs Immature Granulocytes 05/23/2020 0.04  0.00 - 0.07 K/uL Final   Performed at [facility], [address] [place], [place] [address]  . Sodium 05/23/2020 137  135 - 145 mmol/L Final  . Potassium 05/23/2020 4.7  3.5 - 5.1 mmol/L Final  . Chloride 05/23/2020 100  98 - 111 mmol/L Final  . CO2 05/23/2020 27  22 - 32 mmol/L Final  . Glucose, Bld 05/23/2020 210* 70 - 99 mg/dL Final   Glucose reference range applies only to samples taken after fasting for at least 8 hours.  . BUN 05/23/2020 16  6 - 20 mg/dL Final  . Creatinine, Ser 05/23/2020 0.81  0.61 - 1.24 mg/dL Final  . Calcium 51/88/4166 10.1  8.9 - 10.3 mg/dL Final  . Total Protein 05/23/2020 7.2  6.5 - 8.1 g/dL Final  . Albumin 10/03/1599 3.9  3.5 - 5.0 g/dL Final  . AST 09/32/3557 16  15 - 41 U/L Final  . ALT 05/23/2020 16  0 - 44 U/L Final  . Alkaline Phosphatase 05/23/2020 78  38 - 126 U/L Final  . Total Bilirubin 05/23/2020 0.1* 0.3 - 1.2 mg/dL Final  . GFR, Estimated 05/23/2020 >60  >60 mL/min Final   Comment: (NOTE) Calculated using the CKD-EPI Creatinine Equation (2021)   . Anion gap 05/23/2020 10  5 - 15 Final   Performed at [facility], [address], [place], [place] [address]  . Hgb A1c MFr Bld 05/23/2020 10.5* 4.8 - 5.6 % Final   Comment: (NOTE) Pre diabetes:          5.7%-6.4%  Diabetes:              >6.4%  Glycemic control for   <7.0% adults with diabetes   . Mean Plasma Glucose 05/23/2020 254.65  mg/dL Final   Performed at [facility], [address] [place], [place] [address]  . Alcohol, Ethyl (B) 05/23/2020 <10  <10 mg/dL Final   Comment:  (NOTE) Lowest detectable limit for serum alcohol is 10 mg/dL.  For medical purposes only. Performed at [facility], [address] [place], [place] [address]   . TSH 05/23/2020 0.809  0.350 - 4.500 uIU/mL Final   Comment: Performed by a 3rd Generation assay with a functional sensitivity of <=0.01 uIU/mL. Performed at [facility], [address], [place], [place] [address]   . POC Amphetamine UR 05/23/2020 None Detected  NONE DETECTED (Cut Off Level 1000 ng/mL) Final  . POC Secobarbital (BAR) 05/23/2020 None Detected  NONE DETECTED (Cut Off Level 300 ng/mL) Final  . POC Buprenorphine (BUP) 05/23/2020 None Detected  NONE DETECTED (Cut Off Level 10 ng/mL) Final  . POC Oxazepam (BZO) 05/23/2020 None Detected  NONE DETECTED (Cut Off Level 300 ng/mL) Final  . POC Cocaine UR 05/23/2020 None Detected  NONE DETECTED (Cut Off Level 300 ng/mL) Final  . POC Methamphetamine UR 05/23/2020 None  Detected  NONE DETECTED (Cut Off Level 1000 ng/mL) Final  . POC Morphine 05/23/2020 None Detected  NONE DETECTED (Cut Off Level 300 ng/mL) Final  . POC Oxycodone UR 05/23/2020 None Detected  NONE DETECTED (Cut Off Level 100 ng/mL) Final  . POC Methadone UR 05/23/2020 None Detected  NONE DETECTED (Cut Off Level 300 ng/mL) Final  . POC Marijuana UR 05/23/2020 None Detected  NONE DETECTED (Cut Off Level 50 ng/mL) Final  . SARS Coronavirus 2 Ag 05/23/2020 NEGATIVE  NEGATIVE Final   Comment: (NOTE) SARS-CoV-2 antigen NOT DETECTED.   Negative results are presumptive.  Negative results do not preclude SARS-CoV-2 infection and should not be used as the sole basis for treatment or other patient management decisions, including infection  control decisions, particularly in the presence of clinical signs and  symptoms consistent with COVID-19, or in those who have been in contact with the virus.  Negative results must be combined with clinical observations, patient history, and epidemiological information.  The expected result is Negative.  Fact Sheet for Patients: https://www.jennings-kim.com/  Fact Sheet for Healthcare Providers: https://alexander-rogers.biz/  This test is not yet approved or cleared by the [place] FDA and  has been authorized for detection and/or diagnosis of SARS-CoV-2 by FDA under an Emergency Use Authorization (EUA).  This EUA will remain in effect (meaning this test can be used) for the duration of  the COV                          ID-19 declaration under Section 564(b)(1) of the Act, 21 U.S.C. section 360bbb-3(b)(1), unless the authorization is terminated or revoked sooner.    . Glucose-Capillary 05/24/2020 188* 70 - 99 mg/dL Final   Glucose reference range applies only to samples taken after fasting for at least 8 hours.  . Cholesterol 05/23/2020 165  0 - 200 mg/dL Final  . Triglycerides 05/23/2020 69  <150 mg/dL Final  . HDL 40/98/1191 48  >40 mg/dL Final  . Total CHOL/HDL Ratio 05/23/2020 3.4  RATIO Final  . VLDL 05/23/2020 14  0 - 40 mg/dL Final  . LDL Cholesterol 05/23/2020 103* 0 - 99 mg/dL Final   Comment:        Total Cholesterol/HDL:CHD Risk Coronary Heart Disease Risk Table                     Men   Women  1/2 Average Risk   3.4   3.3  Average Risk       5.0   4.4  2 X Average Risk   9.6   7.1  3 X Average Risk  23.4   11.0        Use the calculated Patient Ratio above and the CHD Risk Table to determine the patient's CHD Risk.        ATP III CLASSIFICATION (LDL):  <100     mg/dL   Optimal  478-295  mg/dL   Near or Above                    Optimal  130-159  mg/dL   Borderline  621-308  mg/dL   High  >657     mg/dL   Very High Performed at [facility], [address], [place], [place] [address]   Orders Only on 04/25/2020  Component Date Value Ref Range Status  . Cholesterol 04/25/2020 130  <200 mg/dL Final  . HDL 29/52/8413  45  > OR = 40 mg/dL Final  . Triglycerides 04/25/2020 58  <150 mg/dL Final   . LDL Cholesterol (Calc) 04/25/2020 72  mg/dL (calc) Final   Comment: Reference range: <100 . Desirable range <100 mg/dL for primary prevention;   <70 mg/dL for patients with CHD or diabetic patients  with > or = 2 CHD risk factors. [name] LDL-C is now calculated using the Martin-Hopkins  calculation, which is a validated novel method providing  better accuracy than the Friedewald equation in the  estimation of LDL-C.  Horald Pollen et al. Lenox Ahr. 1610;960(45): 2061-2068  (http://education.[facility].com/faq/FAQ164)   . Total CHOL/HDL Ratio 04/25/2020 2.9  <4.0 (calc) Final  . Non-HDL Cholesterol (Calc) 04/25/2020 85  <130 mg/dL (calc) Final   Comment: For patients with diabetes plus 1 major ASCVD risk  factor, treating to a non-HDL-C goal of <100 mg/dL  (LDL-C of <98 mg/dL) is considered a therapeutic  option.   . Glucose, Bld 04/25/2020 233* 65 - 99 mg/dL Final   Comment: .            Fasting reference interval . For someone without known diabetes, a glucose value >125 mg/dL indicates that they may have diabetes and this should be confirmed with a follow-up test. .   . BUN 04/25/2020 7  7 - 25 mg/dL Final  . Creat 11/91/4782 0.77  0.70 - 1.33 mg/dL Final   Comment: For patients >87 years of age, the reference limit for Creatinine is approximately 13% higher for people identified as African-American. .   . GFR, Est Non African American 04/25/2020 104  > OR = 60 mL/min/1.2m2 Final  . GFR, Est African American 04/25/2020 121  > OR = 60 mL/min/1.88m2 Final  . BUN/Creatinine Ratio 04/25/2020 NOT APPLICABLE  6 - 22 (calc) Final  . Sodium 04/25/2020 141  135 - 146 mmol/L Final  . Potassium 04/25/2020 4.3  3.5 - 5.3 mmol/L Final  . Chloride 04/25/2020 105  98 - 110 mmol/L Final  . CO2 04/25/2020 26  20 - 32 mmol/L Final  . Calcium 04/25/2020 8.9  8.6 - 10.3 mg/dL Final  . Total Protein 04/25/2020 6.8  6.1 - 8.1 g/dL Final  . Albumin 95/62/1308 4.0  3.6 - 5.1 g/dL Final  .  Globulin 65/78/4696 2.8  1.9 - 3.7 g/dL (calc) Final  . AG Ratio 04/25/2020 1.4  1.0 - 2.5 (calc) Final  . Total Bilirubin 04/25/2020 0.5  0.2 - 1.2 mg/dL Final  . Alkaline phosphatase (APISO) 04/25/2020 95  35 - 144 U/L Final  . AST 04/25/2020 20  10 - 35 U/L Final  . ALT 04/25/2020 20  9 - 46 U/L Final  . Vit D, 25-Hydroxy 04/25/2020 20* 30 - 100 ng/mL Final   Comment: Vitamin D Status         25-OH Vitamin D: . Deficiency:                    <20 ng/mL Insufficiency:             20 - 29 ng/mL Optimal:                 > or = 30 ng/mL . For 25-OH Vitamin D testing on patients on  D2-supplementation and patients for whom quantitation  of D2 and D3 fractions is required, the Quest[facility]ureD(TM) 25-OH VIT D, (D2,D3), LC/MS/MS is recommended: order  code 29528 (patients >32yrs). See Note 1 . Note 1 . For additional information, please  refer to  http://education.[facility].com/faq/FAQ199  (This link is being provided for informational/ educational purposes only.)   . TSH 04/25/2020 0.95  0.40 - 4.50 mIU/L Final  . WBC 04/25/2020 6.6  3.8 - 10.8 Thousand/uL Final  . RBC 04/25/2020 4.81  4.20 - 5.80 Million/uL Final  . Hemoglobin 04/25/2020 15.0  13.2 - 17.1 g/dL Final  . HCT 95/28/4132 43.8  38.5 - 50.0 % Final  . MCV 04/25/2020 91.1  80.0 - 100.0 fL Final  . MCH 04/25/2020 31.2  27.0 - 33.0 pg Final  . MCHC 04/25/2020 34.2  32.0 - 36.0 g/dL Final  . RDW 44/04/270 12.9  11.0 - 15.0 % Final  . Platelets 04/25/2020 240  140 - 400 Thousand/uL Final  . MPV 04/25/2020 10.3  7.5 - 12.5 fL Final  . PSA 04/25/2020 0.37  < OR = 4.0 ng/mL Final   Comment: The total PSA value from this assay system is  standardized against the WHO standard. The test  result will be approximately 20% lower when compared  to the equimolar-standardized total PSA (Beckman  Coulter). Comparison of serial PSA results should be  interpreted with this fact in mind. . This test was performed using the Siemens   chemiluminescent method. Values obtained from  different assay methods cannot be used interchangeably. PSA levels, regardless of value, should not be interpreted as absolute evidence of the presence or absence of disease.     Allergies: Patient has no known allergies.  PTA Medications: (Not in a hospital admission)   Medical Decision Making  Admission orders placed.  Start Bactrim DS 800-160 mg BID x 10 days for toe infection  Restart home medications -Risperdal 2 mg QHS for paranoia/AVH -Lantus 40 units subcutaneous daily for DM -Glucophage 500 mg BID for DM -triamcinolone BID prn for itching/rash   Initiate CIWA protocol -lorazepam 1 mg every 6 hours prn for CIWA >10 -thiamine 100 mg daily for nutritional supplementation -ondansetron 4 mg ODT every 6 hours prn nausea/vomiting -loperamide 2-4 mg capsule prn diarrhea or loose stools  Clinical Course as of 05/24/20 0450  Sat May 24, 2020  0422 Lipid panel(!) Lipid Panel unremarkable [JB]  0423 TSH: 0.809 [JB]  0423 SARS Coronavirus 2 Ag: NEGATIVE [JB]  0423 POCT Urine Drug Screen - ([facility]) UDS negative [JB]  0423 Comprehensive metabolic panel(!) Glucose 210, CMP otherwise unremarkable [JB]  0424 EKG 12-Lead EKG pending  [JB]  0443 WBC(!): 10.9 WBC slightly elevated at 10.9. CBC otherwise unremarkable [JB]  0444 Hemoglobin A1C(!): 10.5 Consistent with previous results.  [JB]    Clinical Course User Index [JB] Jackelyn Poling, NP    Recommendations  Based on my evaluation the patient does not appear to have an emergency medical condition.   Recommend inpatient treatment for stabilization. Per Aliene Altes, [facility] there are no appropriate beds available at [facility].    Jackelyn Poling, NP 05/24/20  4:50 AM

## 2020-05-24 LAB — HEMOGLOBIN A1C
Hgb A1c MFr Bld: 10.5 % — ABNORMAL HIGH (ref 4.8–5.6)
Mean Plasma Glucose: 254.65 mg/dL

## 2020-05-24 LAB — COMPREHENSIVE METABOLIC PANEL
ALT: 16 U/L (ref 0–44)
AST: 16 U/L (ref 15–41)
Albumin: 3.9 g/dL (ref 3.5–5.0)
Alkaline Phosphatase: 78 U/L (ref 38–126)
Anion gap: 10 (ref 5–15)
BUN: 16 mg/dL (ref 6–20)
CO2: 27 mmol/L (ref 22–32)
Calcium: 10.1 mg/dL (ref 8.9–10.3)
Chloride: 100 mmol/L (ref 98–111)
Creatinine, Ser: 0.81 mg/dL (ref 0.61–1.24)
GFR, Estimated: >60 mL/min (ref >60)
Glucose, Bld: 210 mg/dL — ABNORMAL HIGH (ref 70–99)
Potassium: 4.7 mmol/L (ref 3.5–5.1)
Sodium: 137 mmol/L (ref 135–145)
Total Bilirubin: 0.1 mg/dL — ABNORMAL LOW (ref 0.3–1.2)
Total Protein: 7.2 g/dL (ref 6.5–8.1)

## 2020-05-24 LAB — LIPID PANEL
Cholesterol: 165 mg/dL (ref 0–200)
HDL: 48 mg/dL (ref >40)
LDL Cholesterol: 103 mg/dL — ABNORMAL HIGH (ref 0–99)
Total CHOL/HDL Ratio: 3.4 RATIO
Triglycerides: 69 mg/dL (ref <150)
VLDL: 14 mg/dL (ref 0–40)

## 2020-05-24 LAB — CBC WITH DIFFERENTIAL/PLATELET
Abs Immature Granulocytes: 0.04 10*3/uL (ref 0.00–0.07)
Basophils Absolute: 0 10*3/uL (ref 0.0–0.1)
Basophils Relative: 0 %
Eosinophils Absolute: 0.1 10*3/uL (ref 0.0–0.5)
Eosinophils Relative: 1 %
HCT: 47.8 % (ref 39.0–52.0)
Hemoglobin: 16.1 g/dL (ref 13.0–17.0)
Immature Granulocytes: 0 %
Lymphocytes Relative: 27 %
Lymphs Abs: 2.9 10*3/uL (ref 0.7–4.0)
MCH: 31.4 pg (ref 26.0–34.0)
MCHC: 33.7 g/dL (ref 30.0–36.0)
MCV: 93.2 fL (ref 80.0–100.0)
Monocytes Absolute: 0.6 10*3/uL (ref 0.1–1.0)
Monocytes Relative: 5 %
Neutro Abs: 7.3 10*3/uL (ref 1.7–7.7)
Neutrophils Relative %: 67 %
Platelets: 232 10*3/uL (ref 150–400)
RBC: 5.13 MIL/uL (ref 4.22–5.81)
RDW: 14.2 % (ref 11.5–15.5)
WBC: 10.9 10*3/uL — ABNORMAL HIGH (ref 4.0–10.5)
nRBC: 0 % (ref 0.0–0.2)

## 2020-05-24 LAB — ETHANOL: Alcohol, Ethyl (B): <10 mg/dL (ref <10)

## 2020-05-24 LAB — RESP PANEL BY RT-PCR (FLU A&B, COVID) ARPGX2
Influenza A by PCR: NEGATIVE
Influenza B by PCR: NEGATIVE
SARS Coronavirus 2 by RT PCR: NEGATIVE

## 2020-05-24 LAB — GLUCOSE, CAPILLARY
Glucose-Capillary: 174 mg/dL — ABNORMAL HIGH (ref 70–99)
Glucose-Capillary: 188 mg/dL — ABNORMAL HIGH (ref 70–99)
Glucose-Capillary: 229 mg/dL — ABNORMAL HIGH (ref 70–99)
Glucose-Capillary: 231 mg/dL — ABNORMAL HIGH (ref 70–99)
Glucose-Capillary: 97 mg/dL (ref 70–99)

## 2020-05-24 LAB — TSH: TSH: 0.809 u[IU]/mL (ref 0.350–4.500)

## 2020-05-24 LAB — HIV ANTIBODY (ROUTINE TESTING W REFLEX): HIV Screen 4th Generation wRfx: NONREACTIVE

## 2020-05-24 MED ORDER — LOPERAMIDE HCL 2 MG PO CAPS: 2.0000 mg | ORAL_CAPSULE | ORAL | Status: DC | PRN

## 2020-05-24 MED ORDER — ONDANSETRON 4 MG PO TBDP: 4.0000 mg | ORAL_TABLET | Freq: Four times a day (QID) | ORAL | Status: DC | PRN

## 2020-05-24 MED ORDER — RISPERIDONE 2 MG PO TABS
2.0000 mg | ORAL_TABLET | Freq: Every day | ORAL | Status: DC
  Administered 2020-05-24: 2 mg via ORAL
  Filled 2020-05-24 (×2): qty 1

## 2020-05-24 MED ORDER — SULFAMETHOXAZOLE-TRIMETHOPRIM 800-160 MG PO TABS: 1.0000 | ORAL_TABLET | Freq: Two times a day (BID) | ORAL | Status: DC

## 2020-05-24 MED ORDER — ADULT MULTIVITAMIN W/MINERALS CH
1.0000 | ORAL_TABLET | Freq: Every day | ORAL | Status: DC
  Administered 2020-05-24 – 2020-05-25 (×2): 1 via ORAL
  Filled 2020-05-24 (×2): qty 1

## 2020-05-24 MED ORDER — VITAMIN D (ERGOCALCIFEROL) 1.25 MG (50000 UNIT) PO CAPS
50000.0000 [IU] | ORAL_CAPSULE | ORAL | Status: DC
  Administered 2020-05-24: 50000 [IU] via ORAL
  Filled 2020-05-24: qty 1

## 2020-05-24 MED ORDER — THIAMINE HCL 100 MG PO TABS
100.0000 mg | ORAL_TABLET | Freq: Every day | ORAL | Status: DC
  Administered 2020-05-25: 100 mg via ORAL
  Filled 2020-05-24: qty 1

## 2020-05-24 MED ORDER — GABAPENTIN 300 MG PO CAPS
300.0000 mg | ORAL_CAPSULE | Freq: Three times a day (TID) | ORAL | Status: DC
  Administered 2020-05-24 – 2020-05-25 (×4): 300 mg via ORAL
  Filled 2020-05-24 (×5): qty 1

## 2020-05-24 MED ORDER — INSULIN ASPART 100 UNIT/ML ~~LOC~~ SOLN: 0.0000 [IU] | Freq: Every day | SUBCUTANEOUS | Status: DC

## 2020-05-24 MED ORDER — SULFAMETHOXAZOLE-TRIMETHOPRIM 800-160 MG PO TABS
1.0000 | ORAL_TABLET | Freq: Two times a day (BID) | ORAL | Status: DC
  Administered 2020-05-24 – 2020-05-25 (×4): 1 via ORAL
  Filled 2020-05-24 (×4): qty 1

## 2020-05-24 MED ORDER — RISPERIDONE 2 MG PO TABS
2.0000 mg | ORAL_TABLET | Freq: Every day | ORAL | Status: DC
  Administered 2020-05-24: 2 mg via ORAL
  Filled 2020-05-24: qty 1

## 2020-05-24 MED ORDER — LORAZEPAM 1 MG PO TABS: 1.0000 mg | ORAL_TABLET | Freq: Four times a day (QID) | ORAL | Status: DC | PRN

## 2020-05-24 MED ORDER — INSULIN ASPART 100 UNIT/ML ~~LOC~~ SOLN
0.0000 [IU] | Freq: Three times a day (TID) | SUBCUTANEOUS | Status: DC
  Administered 2020-05-24 (×3): 3 [IU] via SUBCUTANEOUS
  Administered 2020-05-25: 1 [IU] via SUBCUTANEOUS
  Administered 2020-05-25: 2 [IU] via SUBCUTANEOUS

## 2020-05-24 MED ORDER — INSULIN GLARGINE 100 UNIT/ML ~~LOC~~ SOLN
40.0000 [IU] | SUBCUTANEOUS | Status: DC
  Administered 2020-05-24 – 2020-05-25 (×2): 40 [IU] via SUBCUTANEOUS

## 2020-05-24 MED ORDER — METFORMIN HCL 500 MG PO TABS
500.0000 mg | ORAL_TABLET | Freq: Two times a day (BID) | ORAL | Status: DC
  Administered 2020-05-24 – 2020-05-25 (×3): 500 mg via ORAL
  Filled 2020-05-24 (×3): qty 1

## 2020-05-24 MED ORDER — TRIAMCINOLONE ACETONIDE 0.5 % EX CREA
TOPICAL_CREAM | Freq: Two times a day (BID) | CUTANEOUS | Status: DC | PRN
  Filled 2020-05-24: qty 15

## 2020-05-24 NOTE — ED Notes (Signed)
Pt given meal

## 2020-05-24 NOTE — ED Notes (Signed)
Pt admitted to continuous assessment due to passive SI, AVH, and paranoia. Pt A&O x4, calm and cooperative. Pt tolerated lab work and skin assessment well. Pt ambulated independently to unit. Oriented to unit/staff. No signs of acute distress noted. Will continue to monitor for safety.

## 2020-05-24 NOTE — ED Notes (Signed)
Attempted to obtain EKG, machine not working. Melbourne Abts, PA made aware.

## 2020-05-24 NOTE — ED Notes (Signed)
Pt resting in bed, A&O x4, calm and cooperative. No signs of acute distress noted. Will continue to monitor for safety.  

## 2020-05-24 NOTE — ED Notes (Signed)
Pt asleep in bed. Respirations even and unlabored. Will continue to monitor for safety. ?

## 2020-05-24 NOTE — ED Provider Notes (Cosign Needed Addendum)
Behavioral Health Progress Note  Date and Time: 05/24/2020 3:42 PM Name: Steven Christensen MRN:  [id number]  Subjective:   " I'm feeling fine. I just want whoever to go on and do whatever they are going to do. Don't just keep chastising me. Go on and do it and get it over with".   Patient continues to endorse "voices" that he describes are men and women. He states the voices are telling him "you're going to die", "you have AIDS". Patient states he doesn't know who the voices are but "I'm going to find out. The sad part is, I'm the only one that's hearing them. They see this rash and tell me I have AIDS".   Patient is calm and cooperative. Continues to endorse auditory hallucinations. Denies suicidal or homicidal ideations, or any visual hallucinations. Patient does not appear to be responding to any external/internal stimuli at this time. Patient states he does feel safe at this time.  1600: Patient called for provider to come to his bedside where he continued on about a "rash" in his groin/inner thigh area that he states "I thought I got it from this girl I was with because she had dogs she used to let in the house and she had a rash too". Patient appears somewhat disorganized. Patient currently perseverating over the rash and voices; states the rash is actively "itching". Re-assurrance provided. Patient states he currently feels safe. Personal care items provided for patient to shower and change into gown; prn cream for rash on order. Patient HS dose re-scheduled to now dose. Lab work ordered to rule out organic presentation.   Diagnosis:  Final diagnoses:  Schizophrenia, paranoid (HCC)  Alcohol use disorder, moderate, dependence (HCC)  Toe infection   Total Time spent with patient: 15 minutes  Past Psychiatric History:   -schizoaffective disorder Past Medical History:  Past Medical History:  Diagnosis Date  . Anxiety   . Bipolar affective (HCC)   . Depression   . Diabetes mellitus  without complication (HCC)   . Schizophrenia (HCC)   . Sleeping difficulties    No past surgical history on file. Family History:  Family History  Problem Relation Age of Onset  . Diabetes Maternal Aunt   . Mental illness Neg Hx    Family Psychiatric  History: not noted  Social History:  Social History   Substance and Sexual Activity  Alcohol Use Yes   Comment: Beer and alcohol     Social History   Substance and Sexual Activity  Drug Use Yes  . Types: "Crack" cocaine, Marijuana   Comment: Last use of crack was today    Social History   Socioeconomic History  . Marital status: Single    Spouse name: Not on file  . Number of children: Not on file  . Years of education: Not on file  . Highest education level: Not on file  Occupational History  . Occupation: Disabled  Tobacco Use  . Smoking status: Current Every Day Smoker    Packs/day: 1.00    Years: 35.00    Pack years: 35.00    Types: Cigarettes  . Smokeless tobacco: Never Used  Substance and Sexual Activity  . Alcohol use: Yes    Comment: Beer and alcohol  . Drug use: Yes    Types: "Crack" cocaine, Marijuana    Comment: Last use of crack was today  . Sexual activity: Yes    Birth control/protection: None  Other Topics Concern  . Not on file  Social History Narrative   Pt lives in [place] with mother.  On disability.  Stated that he was previously followed by [facility], but has not been in a while.   Social Determinants of Health   Financial Resource Strain: Not on file  Food Insecurity: Not on file  Transportation Needs: Not on file  Physical Activity: Not on file  Stress: Not on file  Social Connections: Not on file   SDOH:  SDOH Screenings   Alcohol Screen: Not on file  Depression (PHQ2-9): Not on file  Financial Resource Strain: Not on file  Food Insecurity: Not on file  Housing: Not on file  Physical Activity: Not on file  Social Connections: Not on file  Stress: Not on file  Tobacco Use:  Not on file  Transportation Needs: Not on file   Additional Social History:    Pain Medications: See MAR Prescriptions: See MAR Over the Counter: See MAR History of alcohol / drug use?: Yes Name of Substance 1: Cigarettes. 1 - Age of First Use: UTA 1 - Amount (size/oz): Pt reported, smoking 2 packs of cigarettes, daily. 1 - Frequency: Daily. 1 - Duration: Ongoing. 1 - Last Use / Amount: Daily. 1 - Method of Aquiring: Purchase. 1- Route of Use: Smoking. Name of Substance 2: Cocaine. 2 - Age of First Use: UTA 2 - Amount (size/oz): UTA 2 - Frequency: Ongoing, 2 - Duration: UTA 2 - Last Use / Amount: UTA 2 - Method of Aquiring: Ilegally. 2 - Route of Substance Use: Snort.              Sleep: Fair  Appetite:  Fair  Current Medications:  Current Facility-Administered Medications  Medication Dose Route Frequency Provider Last Rate Last Admin  . acetaminophen (TYLENOL) tablet 650 mg  650 mg Oral Q6H PRN Nira Conn A, NP      . alum & mag hydroxide-simeth (MAALOX/MYLANTA) 200-200-20 MG/5ML suspension 30 mL  30 mL Oral Q4H PRN Nira Conn A, NP      . gabapentin (NEURONTIN) capsule 300 mg  300 mg Oral TID Nira Conn A, NP   300 mg at 05/24/20 0916  . hydrOXYzine (ATARAX/VISTARIL) tablet 25 mg  25 mg Oral TID PRN Nira Conn A, NP   25 mg at 05/24/20 0044  . insulin aspart (novoLOG) injection 0-5 Units  0-5 Units Subcutaneous QHS Nira Conn A, NP      . insulin aspart (novoLOG) injection 0-9 Units  0-9 Units Subcutaneous TID WC Nira Conn A, NP   3 Units at 05/24/20 1443  . insulin glargine (LANTUS) injection 40 Units  40 Units Subcutaneous Ruta Hinds A, NP   40 Units at 05/24/20 0748  . loperamide (IMODIUM) capsule 2-4 mg  2-4 mg Oral PRN Nira Conn A, NP      . LORazepam (ATIVAN) tablet 1 mg  1 mg Oral Q6H PRN Nira Conn A, NP      . magnesium hydroxide (MILK OF MAGNESIA) suspension 30 mL  30 mL Oral Daily PRN Nira Conn A, NP      . metFORMIN  (GLUCOPHAGE) tablet 500 mg  500 mg Oral BID WC Nira Conn A, NP   500 mg at 05/24/20 0750  . multivitamin with minerals tablet 1 tablet  1 tablet Oral Daily Nira Conn A, NP   1 tablet at 05/24/20 0916  . ondansetron (ZOFRAN-ODT) disintegrating tablet 4 mg  4 mg Oral Q6H PRN Jackelyn Poling, NP      .  risperiDONE (RISPERDAL) tablet 2 mg  2 mg Oral QHS Nira Conn A, NP   2 mg at 05/24/20 0044  . sulfamethoxazole-trimethoprim (BACTRIM DS) 800-160 MG per tablet 1 tablet  1 tablet Oral BID Nira Conn A, NP   1 tablet at 05/24/20 0917  . [START ON 05/25/2020] thiamine tablet 100 mg  100 mg Oral Daily Nira Conn A, NP      . traZODone (DESYREL) tablet 50 mg  50 mg Oral QHS PRN Nira Conn A, NP   50 mg at 05/24/20 0044  . triamcinolone cream (KENALOG) 0.5 %   Topical BID PRN Jackelyn Poling, NP      . Vitamin D (Ergocalciferol) (DRISDOL) capsule 50,000 Units  50,000 Units Oral Weekly Nira Conn A, NP   50,000 Units at 05/24/20 1448   Current Outpatient Medications  Medication Sig Dispense Refill  . ACCU-CHEK GUIDE test strip 3 (three) times daily.    [name] gabapentin (NEURONTIN) 100 MG capsule Take 100 mg by mouth 3 (three) times daily.    [name] gabapentin (NEURONTIN) 300 MG capsule Take 300 mg by mouth 3 (three) times daily.    . insulin glargine (LANTUS) 100 UNIT/ML injection Inject 0.4 mLs (40 Units total) into the skin every morning. 10 mL 1  . metFORMIN (GLUCOPHAGE) 500 MG tablet Take 1 tablet (500 mg total) by mouth 2 (two) times daily with a meal. 60 tablet 0  . tiZANidine (ZANAFLEX) 4 MG tablet Take 4 mg by mouth at bedtime. Muscle spasm    . Vitamin D, Ergocalciferol, (DRISDOL) 1.25 MG (50000 UNIT) CAPS capsule Take 50,000 Units by mouth every Monday.    [name] amantadine (SYMMETREL) 100 MG capsule Take 1 capsule (100 mg total) by mouth 2 (two) times daily. (Patient not taking: No sig reported) 60 capsule 11  . hydrOXYzine (ATARAX/VISTARIL) 10 MG tablet Take 10 mg by mouth every 6 (six) hours.     . hydrOXYzine (ATARAX/VISTARIL) 25 MG tablet Take 1 tablet (25 mg total) by mouth every 6 (six) hours as needed for anxiety. (Patient not taking: No sig reported) 30 tablet 0  . insulin aspart (NOVOLOG) 100 UNIT/ML injection Inject 4 Units into the skin 3 (three) times daily with meals. (Patient not taking: Reported on 05/24/2020) 10 mL 1  . losartan (COZAAR) 50 MG tablet Take 50 mg by mouth daily.    . predniSONE (DELTASONE) 20 MG tablet Take by mouth.    . risperiDONE (RISPERDAL) 2 MG tablet Take 1 tablet (2 mg total) by mouth at bedtime. (Patient not taking: No sig reported) 30 tablet 2  . traZODone (DESYREL) 50 MG tablet Take 1 tablet (50 mg total) by mouth at bedtime as needed for sleep. (Patient not taking: No sig reported) 30 tablet 0  . triamcinolone cream (KENALOG) 0.5 % Apply topically 2 (two) times daily as needed.      Labs  Lab Results:  Admission on 05/23/2020  Component Date Value Ref Range Status  . SARS Coronavirus 2 by RT PCR 05/23/2020 NEGATIVE  NEGATIVE Final   Comment: (NOTE) SARS-CoV-2 target nucleic acids are NOT DETECTED.  The SARS-CoV-2 RNA is generally detectable in upper respiratory specimens during the acute phase of infection. The lowest concentration of SARS-CoV-2 viral copies this assay can detect is 138 copies/mL. A negative result does not preclude SARS-Cov-2 infection and should not be used as the sole basis for treatment or other patient management decisions. A negative result may occur with  improper specimen collection/handling, submission of  specimen other than nasopharyngeal swab, presence of viral mutation(s) within the areas targeted by this assay, and inadequate number of viral copies(<138 copies/mL). A negative result must be combined with clinical observations, patient history, and epidemiological information. The expected result is Negative.  Fact Sheet for Patients:  BloggerCourse.com  Fact Sheet for Healthcare  Providers:  SeriousBroker.it  This test is no                          t yet approved or cleared by the [place] FDA and  has been authorized for detection and/or diagnosis of SARS-CoV-2 by FDA under an Emergency Use Authorization (EUA). This EUA will remain  in effect (meaning this test can be used) for the duration of the COVID-19 declaration under Section 564(b)(1) of the Act, 21 U.S.C.section 360bbb-3(b)(1), unless the authorization is terminated  or revoked sooner.      . Influenza A by PCR 05/23/2020 NEGATIVE  NEGATIVE Final  . Influenza B by PCR 05/23/2020 NEGATIVE  NEGATIVE Final   Comment: (NOTE) The Xpert Xpress SARS-CoV-2/FLU/RSV plus assay is intended as an aid in the diagnosis of influenza from Nasopharyngeal swab specimens and should not be used as a sole basis for treatment. Nasal washings and aspirates are unacceptable for Xpert Xpress SARS-CoV-2/FLU/RSV testing.  Fact Sheet for Patients: BloggerCourse.com  Fact Sheet for Healthcare Providers: SeriousBroker.it  This test is not yet approved or cleared by the [place] FDA and has been authorized for detection and/or diagnosis of SARS-CoV-2 by FDA under an Emergency Use Authorization (EUA). This EUA will remain in effect (meaning this test can be used) for the duration of the COVID-19 declaration under Section 564(b)(1) of the Act, 21 U.S.C. section 360bbb-3(b)(1), unless the authorization is terminated or revoked.  Performed at [facility], [address], [place], [place] [address]   . SARS Coronavirus 2 Ag 05/23/2020 Negative  Negative Final  . WBC 05/23/2020 10.9* 4.0 - 10.5 K/uL Final  . RBC 05/23/2020 5.13  4.22 - 5.81 MIL/uL Final  . Hemoglobin 05/23/2020 16.1  13.0 - 17.0 g/dL Final  . HCT 65/78/4696 47.8  39.0 - 52.0 % Final  . MCV 05/23/2020 93.2  80.0 - 100.0 fL Final  . MCH 05/23/2020 31.4  26.0 - 34.0 pg  Final  . MCHC 05/23/2020 33.7  30.0 - 36.0 g/dL Final  . RDW 29/52/8413 14.2  11.5 - 15.5 % Final  . Platelets 05/23/2020 232  150 - 400 K/uL Final  . nRBC 05/23/2020 0.0  0.0 - 0.2 % Final  . Neutrophils Relative % 05/23/2020 67  % Final  . Neutro Abs 05/23/2020 7.3  1.7 - 7.7 K/uL Final  . Lymphocytes Relative 05/23/2020 27  % Final  . Lymphs Abs 05/23/2020 2.9  0.7 - 4.0 K/uL Final  . Monocytes Relative 05/23/2020 5  % Final  . Monocytes Absolute 05/23/2020 0.6  0.1 - 1.0 K/uL Final  . Eosinophils Relative 05/23/2020 1  % Final  . Eosinophils Absolute 05/23/2020 0.1  0.0 - 0.5 K/uL Final  . Basophils Relative 05/23/2020 0  % Final  . Basophils Absolute 05/23/2020 0.0  0.0 - 0.1 K/uL Final  . Immature Granulocytes 05/23/2020 0  % Final  . Abs Immature Granulocytes 05/23/2020 0.04  0.00 - 0.07 K/uL Final   Performed at [facility], [address], [place], [place] [address]  . Sodium 05/23/2020 137  135 - 145 mmol/L Final  .  Potassium 05/23/2020 4.7  3.5 - 5.1 mmol/L Final  . Chloride 05/23/2020 100  98 - 111 mmol/L Final  . CO2 05/23/2020 27  22 - 32 mmol/L Final  . Glucose, Bld 05/23/2020 210* 70 - 99 mg/dL Final   Glucose reference range applies only to samples taken after fasting for at least 8 hours.  . BUN 05/23/2020 16  6 - 20 mg/dL Final  . Creatinine, Ser 05/23/2020 0.81  0.61 - 1.24 mg/dL Final  . Calcium 16/01/9603 10.1  8.9 - 10.3 mg/dL Final  . Total Protein 05/23/2020 7.2  6.5 - 8.1 g/dL Final  . Albumin 54/12/8117 3.9  3.5 - 5.0 g/dL Final  . AST 14/78/2956 16  15 - 41 U/L Final  . ALT 05/23/2020 16  0 - 44 U/L Final  . Alkaline Phosphatase 05/23/2020 78  38 - 126 U/L Final  . Total Bilirubin 05/23/2020 0.1* 0.3 - 1.2 mg/dL Final  . GFR, Estimated 05/23/2020 >60  >60 mL/min Final   Comment: (NOTE) Calculated using the CKD-EPI Creatinine Equation (2021)   . Anion gap 05/23/2020 10  5 - 15 Final   Performed at [facility], [address],  [place], [place] [address]  . Hgb A1c MFr Bld 05/23/2020 10.5* 4.8 - 5.6 % Final   Comment: (NOTE) Pre diabetes:          5.7%-6.4%  Diabetes:              >6.4%  Glycemic control for   <7.0% adults with diabetes   . Mean Plasma Glucose 05/23/2020 254.65  mg/dL Final   Performed at [facility], [address], [place], [place] [address]  . Alcohol, Ethyl (B) 05/23/2020 <10  <10 mg/dL Final   Comment: (NOTE) Lowest detectable limit for serum alcohol is 10 mg/dL.  For medical purposes only. Performed at [facility], [address], [place], [place] [address]   . TSH 05/23/2020 0.809  0.350 - 4.500 uIU/mL Final   Comment: Performed by a 3rd Generation assay with a functional sensitivity of <=0.01 uIU/mL. Performed at [facility], [address], [place], [place] [address]   . POC Amphetamine UR 05/23/2020 None Detected  NONE DETECTED (Cut Off Level 1000 ng/mL) Final  . POC Secobarbital (BAR) 05/23/2020 None Detected  NONE DETECTED (Cut Off Level 300 ng/mL) Final  . POC Buprenorphine (BUP) 05/23/2020 None Detected  NONE DETECTED (Cut Off Level 10 ng/mL) Final  . POC Oxazepam (BZO) 05/23/2020 None Detected  NONE DETECTED (Cut Off Level 300 ng/mL) Final  . POC Cocaine UR 05/23/2020 None Detected  NONE DETECTED (Cut Off Level 300 ng/mL) Final  . POC Methamphetamine UR 05/23/2020 None Detected  NONE DETECTED (Cut Off Level 1000 ng/mL) Final  . POC Morphine 05/23/2020 None Detected  NONE DETECTED (Cut Off Level 300 ng/mL) Final  . POC Oxycodone UR 05/23/2020 None Detected  NONE DETECTED (Cut Off Level 100 ng/mL) Final  . POC Methadone UR 05/23/2020 None Detected  NONE DETECTED (Cut Off Level 300 ng/mL) Final  . POC Marijuana UR 05/23/2020 None Detected  NONE DETECTED (Cut Off Level 50 ng/mL) Final  . SARS Coronavirus 2 Ag 05/23/2020 NEGATIVE  NEGATIVE Final   Comment: (NOTE) SARS-CoV-2 antigen NOT DETECTED.   Negative results are presumptive.  Negative results do not  preclude SARS-CoV-2 infection and should not be used as the sole basis for treatment or other patient management decisions, including infection  control decisions, particularly  in the presence of clinical signs and  symptoms consistent with COVID-19, or in those who have been in contact with the virus.  Negative results must be combined with clinical observations, patient history, and epidemiological information. The expected result is Negative.  Fact Sheet for Patients: https://www.jennings-kim.com/  Fact Sheet for Healthcare Providers: https://alexander-rogers.biz/  This test is not yet approved or cleared by the [place] FDA and  has been authorized for detection and/or diagnosis of SARS-CoV-2 by FDA under an Emergency Use Authorization (EUA).  This EUA will remain in effect (meaning this test can be used) for the duration of  the COV                          ID-19 declaration under Section 564(b)(1) of the Act, 21 U.S.C. section 360bbb-3(b)(1), unless the authorization is terminated or revoked sooner.    . Glucose-Capillary 05/24/2020 188* 70 - 99 mg/dL Final   Glucose reference range applies only to samples taken after fasting for at least 8 hours.  . Cholesterol 05/23/2020 165  0 - 200 mg/dL Final  . Triglycerides 05/23/2020 69  <150 mg/dL Final  . HDL 40/98/1191 48  >40 mg/dL Final  . Total CHOL/HDL Ratio 05/23/2020 3.4  RATIO Final  . VLDL 05/23/2020 14  0 - 40 mg/dL Final  . LDL Cholesterol 05/23/2020 103* 0 - 99 mg/dL Final   Comment:        Total Cholesterol/HDL:CHD Risk Coronary Heart Disease Risk Table                     Men   Women  1/2 Average Risk   3.4   3.3  Average Risk       5.0   4.4  2 X Average Risk   9.6   7.1  3 X Average Risk  23.4   11.0        Use the calculated Patient Ratio above and the CHD Risk Table to determine the patient's CHD Risk.        ATP III CLASSIFICATION (LDL):  <100     mg/dL   Optimal  478-295   mg/dL   Near or Above                    Optimal  130-159  mg/dL   Borderline  621-308  mg/dL   High  >657     mg/dL   Very High Performed at [facility], [address], [place], [place] [address]   . Glucose-Capillary 05/24/2020 231* 70 - 99 mg/dL Final   Glucose reference range applies only to samples taken after fasting for at least 8 hours.  . Glucose-Capillary 05/24/2020 229* 70 - 99 mg/dL Final   Glucose reference range applies only to samples taken after fasting for at least 8 hours.  Orders Only on 04/25/2020  Component Date Value Ref Range Status  . Cholesterol 04/25/2020 130  <200 mg/dL Final  . HDL 29/52/8413 45  > OR = 40 mg/dL Final  . Triglycerides 04/25/2020 58  <150 mg/dL Final  . LDL Cholesterol (Calc) 04/25/2020 72  mg/dL (calc) Final   Comment: Reference range: <100 . Desirable range <100 mg/dL for primary prevention;   <70 mg/dL for patients with CHD or diabetic patients  with > or = 2 CHD risk factors. [name] LDL-C is now calculated using the Martin-Hopkins  calculation, which is a validated novel method  providing  better accuracy than the Friedewald equation in the  estimation of LDL-C.  Horald Pollen et al. Lenox Ahr. 3154;008(67): 2061-2068  (http://education.[facility].com/faq/FAQ164)   . Total CHOL/HDL Ratio 04/25/2020 2.9  <6.1 (calc) Final  . Non-HDL Cholesterol (Calc) 04/25/2020 85  <130 mg/dL (calc) Final   Comment: For patients with diabetes plus 1 major ASCVD risk  factor, treating to a non-HDL-C goal of <100 mg/dL  (LDL-C of <95 mg/dL) is considered a therapeutic  option.   . Glucose, Bld 04/25/2020 233* 65 - 99 mg/dL Final   Comment: .            Fasting reference interval . For someone without known diabetes, a glucose value >125 mg/dL indicates that they may have diabetes and this should be confirmed with a follow-up test. .   . BUN 04/25/2020 7  7 - 25 mg/dL Final  . Creat 09/32/6712 0.77  0.70 - 1.33 mg/dL Final   Comment: For  patients >58 years of age, the reference limit for Creatinine is approximately 13% higher for people identified as African-American. .   . GFR, Est Non African American 04/25/2020 104  > OR = 60 mL/min/1.86m2 Final  . GFR, Est African American 04/25/2020 121  > OR = 60 mL/min/1.42m2 Final  . BUN/Creatinine Ratio 04/25/2020 NOT APPLICABLE  6 - 22 (calc) Final  . Sodium 04/25/2020 141  135 - 146 mmol/L Final  . Potassium 04/25/2020 4.3  3.5 - 5.3 mmol/L Final  . Chloride 04/25/2020 105  98 - 110 mmol/L Final  . CO2 04/25/2020 26  20 - 32 mmol/L Final  . Calcium 04/25/2020 8.9  8.6 - 10.3 mg/dL Final  . Total Protein 04/25/2020 6.8  6.1 - 8.1 g/dL Final  . Albumin 45/80/9983 4.0  3.6 - 5.1 g/dL Final  . Globulin 38/25/0539 2.8  1.9 - 3.7 g/dL (calc) Final  . AG Ratio 04/25/2020 1.4  1.0 - 2.5 (calc) Final  . Total Bilirubin 04/25/2020 0.5  0.2 - 1.2 mg/dL Final  . Alkaline phosphatase (APISO) 04/25/2020 95  35 - 144 U/L Final  . AST 04/25/2020 20  10 - 35 U/L Final  . ALT 04/25/2020 20  9 - 46 U/L Final  . Vit D, 25-Hydroxy 04/25/2020 20* 30 - 100 ng/mL Final   Comment: Vitamin D Status         25-OH Vitamin D: . Deficiency:                    <20 ng/mL Insufficiency:             20 - 29 ng/mL Optimal:                 > or = 30 ng/mL . For 25-OH Vitamin D testing on patients on  D2-supplementation and patients for whom quantitation  of D2 and D3 fractions is required, the QuestAssureD(TM) 25-OH VIT D, (D2,D3), LC/MS/MS is recommended: order  code 76734 (patients >76yrs). See Note 1 . Note 1 . For additional information, please refer to  http://education.[facility].com/faq/FAQ199  (This link is being provided for informational/ educational purposes only.)   . TSH 04/25/2020 0.95  0.40 - 4.50 mIU/L Final  . WBC 04/25/2020 6.6  3.8 - 10.8 Thousand/uL Final  . RBC 04/25/2020 4.81  4.20 - 5.80 Million/uL Final  . Hemoglobin 04/25/2020 15.0  13.2 - 17.1 g/dL Final  . HCT  19/37/9024 43.8  38.5 - 50.0 % Final  . MCV 04/25/2020 91.1  80.0 -  100.0 fL Final  . MCH 04/25/2020 31.2  27.0 - 33.0 pg Final  . MCHC 04/25/2020 34.2  32.0 - 36.0 g/dL Final  . RDW 16/01/9603 12.9  11.0 - 15.0 % Final  . Platelets 04/25/2020 240  140 - 400 Thousand/uL Final  . MPV 04/25/2020 10.3  7.5 - 12.5 fL Final  . PSA 04/25/2020 0.37  < OR = 4.0 ng/mL Final   Comment: The total PSA value from this assay system is  standardized against the WHO standard. The test  result will be approximately 20% lower when compared  to the equimolar-standardized total PSA (Beckman  Coulter). Comparison of serial PSA results should be  interpreted with this fact in mind. . This test was performed using the Siemens  chemiluminescent method. Values obtained from  different assay methods cannot be used interchangeably. PSA levels, regardless of value, should not be interpreted as absolute evidence of the presence or absence of disease.     Blood Alcohol level:  Lab Results  Component Value Date   ETH <10 05/23/2020   ETH <10 10/24/2018   Metabolic Disorder Labs: Lab Results  Component Value Date   HGBA1C 10.5 (H) 05/23/2020   MPG 254.65 05/23/2020   MPG 249 01/05/2018   Lab Results  Component Value Date   PROLACTIN 13.3 04/09/2016   PROLACTIN 74.0 (H) 06/03/2015   Lab Results  Component Value Date   CHOL 165 05/23/2020   TRIG 69 05/23/2020   HDL 48 05/23/2020   CHOLHDL 3.4 05/23/2020   VLDL 14 05/23/2020   LDLCALC 103 (H) 05/23/2020   LDLCALC 72 04/25/2020   Therapeutic Lab Levels: No results found for: LITHIUM No results found for: VALPROATE No components found for:  CBMZ  Physical Findings   AIMS   Flowsheet Row [facility]) [facility]) [facility] Ad[facility] ([facility]) [facility]  Admission (Discharged) from 06/03/2015 in [facility]  AIMS Total Score 0 0 0 0    AUDIT   Flowsheet Row [facility]) [facility] Ad[facility] (Discharged[facility] Ad[facility] ([facility]) [facility] Admission (Discharged) from 06/03/2015 in [facility]  Alcohol Use Disorder Identification Test Final Score (AUDIT) 26 33 28 18    Flowsheet Row [facility] Most recent reading at 04/05/2018  4:00 PM ED from 04/05/2018 in [facility] Most recent reading at 04/05/2018  8:58 AM [facility] Most recent reading at 01/03/2018  6:00 PM  C-SSRS RISK CATEGORY High Risk High Risk High Risk       Musculoskeletal  Strength & Muscle Tone: within normal limits Gait & Station: normal Patient leans: Backward  Psychiatric Specialty Exam  Presentation  General Appearance: Appropriate for Environment  Eye Contact:Fair  Speech:Clear and Coherent  Speech Volume:Normal  Handedness:Right   Mood and Affect  Mood:Euthymic  Affect:Congruent   Thought Process  Thought Processes:Coherent  Descriptions of Associations:Intact  Orientation:Full (Time, Place and Person)  Thought Content:Logical  Hallucinations:Hallucinations: None Description of Auditory Hallucinations: pt denies any auditory hallucinations at this time. Description of Visual Hallucinations: pt denies any visual hallucinations at this time.  Ideas of Reference:None  Suicidal  Thoughts:Suicidal Thoughts: No  Homicidal Thoughts:Homicidal Thoughts: No   Sensorium  Memory:Immediate Fair; Recent Fair  Judgment:Fair  Insight:Fair   Executive  Functions  Concentration:Fair  Attention Span:Fair  Recall:Fair  Fund of Knowledge:Fair  Language:Fair   Psychomotor Activity  Psychomotor Activity:Psychomotor Activity: Normal   Assets  Assets:Communication Skills; Desire for Improvement; Housing; Physical Health; Resilience; Social Support   Sleep  Sleep:Sleep: Fair   Physical Exam  Physical Exam Genitourinary:    Pubic Area: Rash present.    Skin:    Findings: Rash present. Rash is macular and urticarial.  Psychiatric:        Attention and Perception: Attention normal. He perceives auditory hallucinations.        Speech: Speech normal.        Behavior: Behavior is withdrawn. Behavior is cooperative.        Thought Content: Thought content is paranoid and delusional.        Cognition and Memory: Cognition and memory normal.    Review of Systems  Skin: Positive for itching and rash.  Psychiatric/Behavioral: Positive for hallucinations.  All other systems reviewed and are negative.  Blood pressure 135/81, pulse 76, temperature 97.8 F (36.6 C), temperature source Oral, resp. rate 18, height 6\' 4"  (1.93 m), weight 200 lb (90.7 kg), SpO2 100 %. Body mass index is 24.34 kg/m.  Treatment Plan Summary: Daily contact with patient to assess and evaluate symptoms and progress in treatment. Medication management initiated with plan to re-evaluate patient in the morning.   , NP 05/24/2020 3:42 PM

## 2020-05-24 NOTE — ED Notes (Signed)
Pt given snack. 

## 2020-05-25 LAB — GLUCOSE, CAPILLARY
Glucose-Capillary: 125 mg/dL — ABNORMAL HIGH (ref 70–99)
Glucose-Capillary: 182 mg/dL — ABNORMAL HIGH (ref 70–99)

## 2020-05-25 LAB — RPR: RPR Ser Ql: NONREACTIVE

## 2020-05-25 MED ORDER — RISPERIDONE 2 MG PO TABS: 2.0000 mg | ORAL_TABLET | Freq: Every day | ORAL | 0 refills | Status: DC

## 2020-05-25 NOTE — ED Notes (Signed)
Breakfast given.  

## 2020-05-25 NOTE — ED Notes (Signed)
Pt awake sitting on side of bed. No s/s pain, discomfort, or acute distress noted. Calm and cooperative. Will continue to monitor for safety

## 2020-05-25 NOTE — ED Notes (Signed)
Pt sitting on bed eating a snack. No signs of acute distress noted. Will continue to monitor for safety.

## 2020-05-25 NOTE — ED Provider Notes (Signed)
[facility]BC/OBS ASAP Discharge Summary  Date and Time: 05/25/2020 9:47 AM  Name: Steven Christensen  MRN:  [id number]   Discharge Diagnoses:  Final diagnoses:  Schizophrenia, paranoid (HCC)  Alcohol use disorder, moderate, dependence (HCC)  Toe infection   Subjective:  "I slept really good last night. I mean really good. I don't hear nothing today. I feel fine. I spoke with my momma this morning. I know I need to stay on my medication. [facility] and take me to my appointments and come by the house".   Collateral: Kizzie BaneDorothy Minor [phone] Mom states patient called this morning and he sounded "okay. Just anxious to get home". I know he needs help. He might have a good day he's changed drastically over the past few months. Everything has to go his way. It's miserable trying to live with somebody like that. Mom discussed patient mental health and substance abuse history with provider. Patient currently lives with mother in [place]. Patient has past psychiatric history of polysubstance abuse, schizophrenia, bipolar affective disorder, and inpatient hospitalizations ([facility]).   Stay Summary:  Patient presents calm and cooperative; engaging in assessment. Patient states he slept "good" throughout the night. Endorses improvement in his rash. Eye contact steady. Patient denies having any auditory, visual hallucinations, or thoughts of paranoia at this time or anytime during the night. Patient denies any suicidal or homicidal ideations and does not appear to be responding to any external/internal stimuli at this time. Patient states he is interested in restarting ACTT Team services to ensure future compliance. Patient provided verbal consent to speak to his mother for discharge and safety planning.   Total Time spent with patient: 15 minutes  Past Psychiatric History:  -Schizophrenia  -Bipolar affective  -Anxiety  -Substance Induced Mood Disorder  -Polysubstance  Abuse  Past Medical History:  Past Medical History:  Diagnosis Date  . Anxiety   . Bipolar affective (HCC)   . Depression   . Diabetes mellitus without complication (HCC)   . Schizophrenia (HCC)   . Sleeping difficulties    No past surgical history on file. Family History:  Family History  Problem Relation Age of Onset  . Diabetes Maternal Aunt   . Mental illness Neg Hx    Family Psychiatric History:   -not noted  Social History:  Social History   Substance and Sexual Activity  Alcohol Use Yes   Comment: Beer and alcohol     Social History   Substance and Sexual Activity  Drug Use Yes  . Types: "Crack" cocaine, Marijuana   Comment: Last use of crack was today    Social History   Socioeconomic History  . Marital status: Single    Spouse name: Not on file  . Number of children: Not on file  . Years of education: Not on file  . Highest education level: Not on file  Occupational History  . Occupation: Disabled  Tobacco Use  . Smoking status: Current Every Day Smoker    Packs/day: 1.00    Years: 35.00    Pack years: 35.00    Types: Cigarettes  . Smokeless tobacco: Never Used  Substance and Sexual Activity  . Alcohol use: Yes    Comment: Beer and alcohol  . Drug use: Yes    Types: "Crack" cocaine, Marijuana    Comment: Last use of crack was today  . Sexual activity: Yes    Birth control/protection: None  Other Topics Concern  .  Not on file  Social History Narrative   Pt lives in [place] with mother.  On disability.  Stated that he was previously followed by [facility], but has not been in a while.   Social Determinants of Health   Financial Resource Strain: Not on file  Food Insecurity: Not on file  Transportation Needs: Not on file  Physical Activity: Not on file  Stress: Not on file  Social Connections: Not on file   SDOH:  SDOH Screenings   Alcohol Screen: Not on file  Depression (PHQ2-9): Not on file  Financial Resource Strain: Not on file   Food Insecurity: Not on file  Housing: Not on file  Physical Activity: Not on file  Social Connections: Not on file  Stress: Not on file  Tobacco Use: Not on file  Transportation Needs: Not on file   Has this patient used any form of tobacco in the last 30 days? (Cigarettes, Smokeless Tobacco, Cigars, and/or Pipes) Prescription not provided because: patient declined  Current Medications:  Current Facility-Administered Medications  Medication Dose Route Frequency Provider Last Rate Last Admin  . acetaminophen (TYLENOL) tablet 650 mg  650 mg Oral Q6H PRN Nira Conn A, NP      . alum & mag hydroxide-simeth (MAALOX/MYLANTA) 200-200-20 MG/5ML suspension 30 mL  30 mL Oral Q4H PRN Nira Conn A, NP      . gabapentin (NEURONTIN) capsule 300 mg  300 mg Oral TID Nira Conn A, NP   300 mg at 05/24/20 2114  . hydrOXYzine (ATARAX/VISTARIL) tablet 25 mg  25 mg Oral TID PRN Jackelyn Poling, NP   25 mg at 05/24/20 2115  . insulin aspart (novoLOG) injection 0-5 Units  0-5 Units Subcutaneous QHS Nira Conn A, NP      . insulin aspart (novoLOG) injection 0-9 Units  0-9 Units Subcutaneous TID WC Jackelyn Poling, NP   1 Units at 05/25/20 0802  . insulin glargine (LANTUS) injection 40 Units  40 Units Subcutaneous Ruta Hinds A, NP   40 Units at 05/25/20 0805  . loperamide (IMODIUM) capsule 2-4 mg  2-4 mg Oral PRN Nira Conn A, NP      . LORazepam (ATIVAN) tablet 1 mg  1 mg Oral Q6H PRN Nira Conn A, NP      . magnesium hydroxide (MILK OF MAGNESIA) suspension 30 mL  30 mL Oral Daily PRN Nira Conn A, NP      . metFORMIN (GLUCOPHAGE) tablet 500 mg  500 mg Oral BID WC Nira Conn A, NP   500 mg at 05/24/20 1712  . multivitamin with minerals tablet 1 tablet  1 tablet Oral Daily Nira Conn A, NP   1 tablet at 05/24/20 0916  . ondansetron (ZOFRAN-ODT) disintegrating tablet 4 mg  4 mg Oral Q6H PRN Nira Conn A, NP      . risperiDONE (RISPERDAL) tablet 2 mg  2 mg Oral QHS Leevy-Johnson, Madicyn Mesina  A, NP   2 mg at 05/24/20 1712  . sulfamethoxazole-trimethoprim (BACTRIM DS) 800-160 MG per tablet 1 tablet  1 tablet Oral BID Nira Conn A, NP   1 tablet at 05/24/20 2115  . thiamine tablet 100 mg  100 mg Oral Daily Nira Conn A, NP      . traZODone (DESYREL) tablet 50 mg  50 mg Oral QHS PRN Nira Conn A, NP   50 mg at 05/24/20 2116  . triamcinolone cream (KENALOG) 0.5 %   Topical BID PRN Jackelyn Poling, NP   Given  at 05/24/20 1554  . Vitamin D (Ergocalciferol) (DRISDOL) capsule 50,000 Units  50,000 Units Oral Weekly Nira Conn A, NP   50,000 Units at 05/24/20 1448   Current Outpatient Medications  Medication Sig Dispense Refill  . ACCU-CHEK GUIDE test strip 3 (three) times daily.    [name] gabapentin (NEURONTIN) 100 MG capsule Take 100 mg by mouth 3 (three) times daily.    [name] gabapentin (NEURONTIN) 300 MG capsule Take 300 mg by mouth 3 (three) times daily.    . insulin glargine (LANTUS) 100 UNIT/ML injection Inject 0.4 mLs (40 Units total) into the skin every morning. 10 mL 1  . metFORMIN (GLUCOPHAGE) 500 MG tablet Take 1 tablet (500 mg total) by mouth 2 (two) times daily with a meal. 60 tablet 0  . tiZANidine (ZANAFLEX) 4 MG tablet Take 4 mg by mouth at bedtime. Muscle spasm    . Vitamin D, Ergocalciferol, (DRISDOL) 1.25 MG (50000 UNIT) CAPS capsule Take 50,000 Units by mouth every Monday.    [name] amantadine (SYMMETREL) 100 MG capsule Take 1 capsule (100 mg total) by mouth 2 (two) times daily. (Patient not taking: No sig reported) 60 capsule 11  . hydrOXYzine (ATARAX/VISTARIL) 10 MG tablet Take 10 mg by mouth every 6 (six) hours.    . hydrOXYzine (ATARAX/VISTARIL) 25 MG tablet Take 1 tablet (25 mg total) by mouth every 6 (six) hours as needed for anxiety. (Patient not taking: No sig reported) 30 tablet 0  . insulin aspart (NOVOLOG) 100 UNIT/ML injection Inject 4 Units into the skin 3 (three) times daily with meals. (Patient not taking: Reported on 05/24/2020) 10 mL 1  . losartan (COZAAR) 50 MG  tablet Take 50 mg by mouth daily.    . predniSONE (DELTASONE) 20 MG tablet Take by mouth.    . risperiDONE (RISPERDAL) 2 MG tablet Take 1 tablet (2 mg total) by mouth at bedtime. (Patient not taking: No sig reported) 30 tablet 2  . traZODone (DESYREL) 50 MG tablet Take 1 tablet (50 mg total) by mouth at bedtime as needed for sleep. (Patient not taking: No sig reported) 30 tablet 0  . triamcinolone cream (KENALOG) 0.5 % Apply topically 2 (two) times daily as needed.     PTA Medications: (Not in a hospital admission)  Musculoskeletal  Strength & Muscle Tone: within normal limits Gait & Station: normal Patient leans: N/A  Psychiatric Specialty Exam  Presentation  General Appearance: Appropriate for Environment  Eye Contact:Fair  Speech:Normal Rate; Clear and Coherent  Speech Volume:Normal  Handedness:Right  Mood and Affect  Mood:Euthymic  Affect:Appropriate  Thought Process  Thought Processes:Coherent  Descriptions of Associations:Intact  Orientation:Full (Time, Place and Person)  Thought Content:Logical  Hallucinations:Hallucinations: None Description of Auditory Hallucinations: pt endorses auditory hallucinations Description of Visual Hallucinations: pt denies any visual hallucinations at this time.  Ideas of Reference:None  Suicidal Thoughts:Suicidal Thoughts: No  Homicidal Thoughts:Homicidal Thoughts: No  Sensorium  Memory:Immediate Fair; Remote Fair; Recent Fair  Judgment:Fair  Insight:Fair  Executive Functions  Concentration:Fair  Attention Span:Fair  Recall:Fair  Fund of Knowledge:Fair  Language:Fair  Psychomotor Activity  Psychomotor Activity:Psychomotor Activity: Normal  Assets  Assets:Communication Skills; Desire for Improvement; Financial Resources/Insurance; Housing; Physical Health; Resilience; Social Support  Sleep  Sleep:Sleep: Good   Physical Exam  Physical Exam Psychiatric:        Attention and Perception: Attention and  perception normal.        Mood and Affect: Mood normal.        Speech: Speech normal.  Behavior: Behavior is cooperative.        Thought Content: Thought content normal.        Cognition and Memory: Cognition and memory normal.    Review of Systems  Psychiatric/Behavioral: Negative.   All other systems reviewed and are negative.  Blood pressure 133/75, pulse 74, temperature 98 F (36.7 C), temperature source Oral, resp. rate 18, height 6\' 4"  (1.93 m), weight 200 lb (90.7 kg), SpO2 100 %. Body mass index is 24.34 kg/m.  Demographic Factors:  Male, Low socioeconomic status and Unemployed  Loss Factors: NA  Historical Factors: NA  Risk Reduction Factors:   Responsible for children under 5 years of age, Sense of responsibility to family and Living with another person, especially a relative  Continued Clinical Symptoms:  Previous Psychiatric Diagnoses and Treatments  Cognitive Features That Contribute To Risk:  None    Suicide Risk:  Minimal: No identifiable suicidal ideation.  Patients presenting with no risk factors but with morbid ruminations; may be classified as minimal risk based on the severity of the depressive symptoms  Plan Of Care/Follow-up recommendations:   -Discharge patient home with outpatient resources  -Social work contacted regarding referral process for restarting ACTT   Team services  -Information included for CDIOP and Outpatient services provided at  [facility]  -Prescription given (Risperidone 2mg )  Disposition: Patient to discharge home with plan for outpatient follow-up. Social work provided [facility] follow-up for AVS. Prescription for Risperidone 2mg  given.   , NP 05/25/2020, 9:47 AM

## 2020-05-25 NOTE — ED Notes (Signed)
Patient A&O x 4, ambulatory. Patient discharged in no acute distress. Patient denied SI/HI, A/VH upon discharge. Patient verbalized understanding of all discharge instructions explained by staff, to include follow up appointments, RX's and safety plan. Patient reported mood 10/10.  Patient escorted to lobby via staff for transport to destination. Safety maintained.   

## 2020-05-25 NOTE — Care Management (Signed)
Writer contacted [facility] [phone]) and spoke to the clinician Mitzi Davenport) regarding linking the patient his previous [facility].   Mitzi Davenport reports that he was with [facility] in 2014.  [facility] clinician Mitzi Davenport) reports that she is not able enroll the patient in the ACTT Team.  Mitzi Davenport reports that he will have to go to [place] and sign up for [facility]  He would have to go in to [place] with their Open Access between 9am - 3pm.   [phone] is the phone number.  The address to [facility] is [address] in [place]

## 2020-05-26 LAB — GC/CHLAMYDIA PROBE AMP (~~LOC~~) NOT AT ARMC
Chlamydia: NEGATIVE
Comment: NEGATIVE
Comment: NORMAL
Neisseria Gonorrhea: NEGATIVE

## 2021-04-05 ENCOUNTER — Other Ambulatory Visit: Payer: Self-pay

## 2021-04-05 ENCOUNTER — Emergency Department (HOSPITAL_COMMUNITY)
Admission: EM | Admit: 2021-04-05 | Discharge: 2021-04-05 | Discharge status: Against Medical Advice | Payer: Medicaid Other | Attending: Emergency Medicine | Admitting: Emergency Medicine

## 2021-04-05 ENCOUNTER — Emergency Department (HOSPITAL_COMMUNITY): Payer: Medicaid Other

## 2021-04-05 ENCOUNTER — Encounter (HOSPITAL_COMMUNITY): Payer: Self-pay | Admitting: Emergency Medicine

## 2021-04-05 DIAGNOSIS — R030 Elevated blood-pressure reading, without diagnosis of hypertension: Secondary | ICD-10-CM

## 2021-04-05 DIAGNOSIS — R531 Weakness: Secondary | ICD-10-CM

## 2021-04-05 DIAGNOSIS — Z79899 Other long term (current) drug therapy: Secondary | ICD-10-CM | POA: Diagnosis not present

## 2021-04-05 DIAGNOSIS — Z794 Long term (current) use of insulin: Secondary | ICD-10-CM | POA: Insufficient documentation

## 2021-04-05 DIAGNOSIS — R404 Transient alteration of awareness: Secondary | ICD-10-CM | POA: Diagnosis not present

## 2021-04-05 DIAGNOSIS — Z7984 Long term (current) use of oral hypoglycemic drugs: Secondary | ICD-10-CM | POA: Insufficient documentation

## 2021-04-05 DIAGNOSIS — Z20822 Contact with and (suspected) exposure to covid-19: Secondary | ICD-10-CM | POA: Diagnosis not present

## 2021-04-05 DIAGNOSIS — R200 Anesthesia of skin: Secondary | ICD-10-CM

## 2021-04-05 DIAGNOSIS — R202 Paresthesia of skin: Secondary | ICD-10-CM | POA: Diagnosis not present

## 2021-04-05 DIAGNOSIS — F1410 Cocaine abuse, uncomplicated: Secondary | ICD-10-CM | POA: Diagnosis not present

## 2021-04-05 DIAGNOSIS — R569 Unspecified convulsions: Secondary | ICD-10-CM | POA: Insufficient documentation

## 2021-04-05 LAB — CBC WITH DIFFERENTIAL/PLATELET
Abs Immature Granulocytes: 0.01 10*3/uL (ref 0.00–0.07)
Basophils Absolute: 0 10*3/uL (ref 0.0–0.1)
Basophils Relative: 0 %
Eosinophils Absolute: 0.1 10*3/uL (ref 0.0–0.5)
Eosinophils Relative: 1 %
HCT: 44.7 % (ref 39.0–52.0)
Hemoglobin: 14.6 g/dL (ref 13.0–17.0)
Immature Granulocytes: 0 %
Lymphocytes Relative: 16 %
Lymphs Abs: 1.1 10*3/uL (ref 0.7–4.0)
MCH: 30.5 pg (ref 26.0–34.0)
MCHC: 32.7 g/dL (ref 30.0–36.0)
MCV: 93.3 fL (ref 80.0–100.0)
Monocytes Absolute: 0.4 10*3/uL (ref 0.1–1.0)
Monocytes Relative: 6 %
Neutro Abs: 5 10*3/uL (ref 1.7–7.7)
Neutrophils Relative %: 77 %
Platelets: 149 10*3/uL — ABNORMAL LOW (ref 150–400)
RBC: 4.79 MIL/uL (ref 4.22–5.81)
RDW: 13.8 % (ref 11.5–15.5)
WBC: 6.6 10*3/uL (ref 4.0–10.5)
nRBC: 0 % (ref 0.0–0.2)

## 2021-04-05 LAB — COMPREHENSIVE METABOLIC PANEL
ALT: 15 U/L (ref 0–44)
AST: 21 U/L (ref 15–41)
Albumin: 3.5 g/dL (ref 3.5–5.0)
Alkaline Phosphatase: 70 U/L (ref 38–126)
Anion gap: 10 (ref 5–15)
BUN: 8 mg/dL (ref 6–20)
CO2: 24 mmol/L (ref 22–32)
Calcium: 8.8 mg/dL — ABNORMAL LOW (ref 8.9–10.3)
Chloride: 105 mmol/L (ref 98–111)
Creatinine, Ser: 0.79 mg/dL (ref 0.61–1.24)
GFR, Estimated: >60 mL/min (ref >60)
Glucose, Bld: 255 mg/dL — ABNORMAL HIGH (ref 70–99)
Potassium: 3.5 mmol/L (ref 3.5–5.1)
Sodium: 139 mmol/L (ref 135–145)
Total Bilirubin: 1.3 mg/dL — ABNORMAL HIGH (ref 0.3–1.2)
Total Protein: 6.5 g/dL (ref 6.5–8.1)

## 2021-04-05 LAB — RESP PANEL BY RT-PCR (FLU A&B, COVID) ARPGX2
Influenza A by PCR: NEGATIVE
Influenza B by PCR: NEGATIVE
SARS Coronavirus 2 by RT PCR: NEGATIVE

## 2021-04-05 LAB — ETHANOL: Alcohol, Ethyl (B): <10 mg/dL (ref <10)

## 2021-04-05 LAB — TSH: TSH: 0.268 u[IU]/mL — ABNORMAL LOW (ref 0.350–4.500)

## 2021-04-05 LAB — I-STAT VENOUS BLOOD GAS, ED
Acid-Base Excess: 0 mmol/L (ref 0.0–2.0)
Bicarbonate: 24.9 mmol/L (ref 20.0–28.0)
Calcium, Ion: 1.15 mmol/L (ref 1.15–1.40)
HCT: 44 % (ref 39.0–52.0)
Hemoglobin: 15 g/dL (ref 13.0–17.0)
O2 Saturation: 95 %
Potassium: 3.5 mmol/L (ref 3.5–5.1)
Sodium: 140 mmol/L (ref 135–145)
TCO2: 26 mmol/L (ref 22–32)
pCO2, Ven: 39.4 mmHg — ABNORMAL LOW (ref 44.0–60.0)
pH, Ven: 7.408 (ref 7.250–7.430)
pO2, Ven: 74 mmHg — ABNORMAL HIGH (ref 32.0–45.0)

## 2021-04-05 LAB — PROTIME-INR
INR: 1 (ref 0.8–1.2)
Prothrombin Time: 13.3 seconds (ref 11.4–15.2)

## 2021-04-05 LAB — MAGNESIUM: Magnesium: 1.9 mg/dL (ref 1.7–2.4)

## 2021-04-05 LAB — BETA-HYDROXYBUTYRIC ACID: Beta-Hydroxybutyric Acid: 2.46 mmol/L — ABNORMAL HIGH (ref 0.05–0.27)

## 2021-04-05 LAB — LIPASE, BLOOD: Lipase: 33 U/L (ref 11–51)

## 2021-04-05 LAB — AMMONIA: Ammonia: 32 umol/L (ref 9–35)

## 2021-04-05 MED ORDER — LACOSAMIDE 50 MG PO TABS: 100.0000 mg | ORAL_TABLET | Freq: Two times a day (BID) | ORAL | Status: DC

## 2021-04-05 MED ORDER — LEVETIRACETAM IN NACL 1000 MG/100ML IV SOLN
1000.0000 mg | Freq: Once | INTRAVENOUS | Status: AC
Start: 2021-04-05 — End: 2021-04-05
  Administered 2021-04-05: 1000 mg via INTRAVENOUS
  Filled 2021-04-05: qty 100

## 2021-04-05 MED ORDER — LORAZEPAM 2 MG/ML IJ SOLN
2.0000 mg | Freq: Once | INTRAMUSCULAR | Status: AC
  Administered 2021-04-05: 2 mg via INTRAVENOUS
  Filled 2021-04-05: qty 1

## 2021-04-05 NOTE — ED Triage Notes (Signed)
Pt here via EMS d/t poly substance abuse last night including ETOH, cocaine and others unknown. Pt found by mother on floor after getting something to eat. Not long on floor. Pt reports left sided numbness to EMS X2 days. Pt had 3 focal seizures with some extremity rigidity .EMS gave 2.5 versed on third seizure. No trauma noted  240/140 170/90 HR 98 RR 18 98% RA

## 2021-04-05 NOTE — ED Provider Notes (Signed)
Patient seen after prior EDP.  Patient was comfortable and awaiting MRI.  Final disposition was dependent on additional ED evaluation.  I personally advised the patient that he may end up needing to be admitted for further work-up and treatment.  Shortly thereafter, apparently the patient decided that he wanted to leave.  He removed his own IV and then ambulated with steady gait from the ED.  This provider was informed after the patient left the facility that he had left.   Wynetta Fines, MD 04/05/21 [phone]

## 2021-04-05 NOTE — ED Notes (Signed)
Patient took out his own IV and stated that he no longer wants to wait. He walked out of ED with steady gait with his mother. Patient understands the risk of leaving with more seizures

## 2021-04-05 NOTE — ED Notes (Signed)
Patient transported to CT 

## 2021-04-05 NOTE — ED Notes (Signed)
Spoke to pt mother and provided update per pt ok. Mother stated pt has been drinking in excess for several months.

## 2021-04-05 NOTE — ED Notes (Signed)
Patient transported to MRI 

## 2021-04-05 NOTE — Progress Notes (Signed)
EEG complete - results pending 

## 2021-04-05 NOTE — ED Provider Notes (Signed)
[facility] EMERGENCY DEPARTMENT Provider Note   CSN: [id number] Arrival date & time: 04/05/21  0855     History  Chief Complaint  Patient presents with   Seizures    Steven Christensen is a 51 y.o. male.  The history is provided by the patient and medical records. No language interpreter was used.  Seizures Seizure activity on arrival: no   Seizure type:  Myoclonic Initial focality:  Facial Episode characteristics: abnormal movements, partial responsiveness and stiffening   Postictal symptoms: somnolence   Timing:  Clustered Number of seizures this episode:  3 Progression:  Resolved Context: drug use   PTA treatment:  Midazolam History of seizures: yes (remote)   Current therapy:  None     Home Medications Prior to Admission medications   Medication Sig Start Date End Date Taking? Authorizing Provider  ACCU-CHEK GUIDE test strip 3 (three) times daily. 05/01/20   [provider]  gabapentin (NEURONTIN) 300 MG capsule Take 300 mg by mouth 3 (three) times daily. 04/25/20   [provider]  insulin aspart (NOVOLOG) 100 UNIT/ML injection Inject 4 Units into the skin 3 (three) times daily with meals. Patient not taking: Reported on 05/24/2020 01/07/18   Money, Gerlene Burdock, FNP  insulin glargine (LANTUS) 100 UNIT/ML injection Inject 0.4 mLs (40 Units total) into the skin every morning. 01/08/18   Money, Gerlene Burdock, FNP  losartan (COZAAR) 50 MG tablet Take 50 mg by mouth daily. 04/25/20   [provider]  metFORMIN (GLUCOPHAGE) 500 MG tablet Take 1 tablet (500 mg total) by mouth 2 (two) times daily with a meal. 01/07/18   Money, Gerlene Burdock, FNP  risperiDONE (RISPERDAL) 2 MG tablet Take 1 tablet (2 mg total) by mouth at bedtime. 05/25/20   Leevy-Johnson, Brooke A, NP  tiZANidine (ZANAFLEX) 4 MG tablet Take 4 mg by mouth at bedtime. Muscle spasm 05/09/20   [provider]  triamcinolone cream (KENALOG) 0.5 % Apply topically 2 (two) times daily as  needed. 04/25/20   [provider]  Vitamin D, Ergocalciferol, (DRISDOL) 1.25 MG (50000 UNIT) CAPS capsule Take 50,000 Units by mouth every Monday. 04/29/20   [provider]      Allergies    Patient has no known allergies.    Review of Systems   Review of Systems  Constitutional:  Positive for fatigue. Negative for chills and fever.  HENT:  Negative for congestion.   Respiratory:  Negative for cough, chest tightness, shortness of breath and wheezing.   Cardiovascular:  Negative for chest pain.  Gastrointestinal:  Negative for abdominal pain, constipation, diarrhea, nausea and vomiting.  Genitourinary:  Negative for dysuria and flank pain.  Musculoskeletal:  Negative for back pain, neck pain and neck stiffness.  Skin:  Negative for rash and wound.  Neurological:  Positive for seizures, weakness, numbness and headaches.  Psychiatric/Behavioral:  Negative for agitation and confusion.   All other systems reviewed and are negative.  Physical Exam Updated Vital Signs BP (!) 179/81    Pulse (!) 51    Temp 98.5 F (36.9 C) (Oral)    Resp 20    Ht 6\' 4"  (1.93 m)    Wt 90.7 kg    SpO2 99%    BMI 24.34 kg/m  Physical Exam Vitals and nursing note reviewed.  Constitutional:      General: He is not in acute distress.    Appearance: He is well-developed. He is not ill-appearing, toxic-appearing or diaphoretic.  HENT:  Head: Normocephalic and atraumatic.     Nose: No congestion or rhinorrhea.     Mouth/Throat:     Mouth: Mucous membranes are moist.     Pharynx: No oropharyngeal exudate or posterior oropharyngeal erythema.  Eyes:     Extraocular Movements: Extraocular movements intact.     Conjunctiva/sclera: Conjunctivae normal.     Pupils: Pupils are equal, round, and reactive to light.  Cardiovascular:     Rate and Rhythm: Normal rate and regular rhythm.     Heart sounds: No murmur heard. Pulmonary:     Effort: Pulmonary effort is normal. No respiratory distress.      Breath sounds: Normal breath sounds. No wheezing, rhonchi or rales.  Chest:     Chest wall: No tenderness.  Abdominal:     General: Abdomen is flat.     Palpations: Abdomen is soft.     Tenderness: There is no abdominal tenderness. There is no right CVA tenderness, left CVA tenderness, guarding or rebound.  Musculoskeletal:        General: No swelling, tenderness or signs of injury.     Cervical back: Neck supple. No tenderness.     Right lower leg: No edema.     Left lower leg: No edema.  Skin:    General: Skin is warm and dry.     Capillary Refill: Capillary refill takes less than 2 seconds.     Findings: No erythema.  Neurological:     GCS: GCS eye subscore is 4. GCS verbal subscore is 5. GCS motor subscore is 6.     Cranial Nerves: No dysarthria or facial asymmetry.     Sensory: Sensory deficit present.     Motor: Weakness present.     Comments: Somnolent  Left arm and left leg weakness and numbness compared to right.  Left sided facial numbness but no weakness appreciated.   Psychiatric:        Mood and Affect: Mood normal.    ED Results / Procedures / Treatments   Labs (all labs ordered are listed, but only abnormal results are displayed) Labs Reviewed  CBC WITH DIFFERENTIAL/PLATELET - Abnormal; Notable for the following components:      Result Value   Platelets 149 (*)    All other components within normal limits  COMPREHENSIVE METABOLIC PANEL - Abnormal; Notable for the following components:   Glucose, Bld 255 (*)    Calcium 8.8 (*)    Total Bilirubin 1.3 (*)    All other components within normal limits  TSH - Abnormal; Notable for the following components:   TSH 0.268 (*)    All other components within normal limits  BETA-HYDROXYBUTYRIC ACID - Abnormal; Notable for the following components:   Beta-Hydroxybutyric Acid 2.46 (*)    All other components within normal limits  I-STAT VENOUS BLOOD GAS, ED - Abnormal; Notable for the following components:   pCO2,  Ven 39.4 (*)    pO2, Ven 74.0 (*)    All other components within normal limits  RESP PANEL BY RT-PCR (FLU A&B, COVID) ARPGX2  URINE CULTURE  LIPASE, BLOOD  ETHANOL  PROTIME-INR  AMMONIA  MAGNESIUM  RAPID URINE DRUG SCREEN, HOSP PERFORMED  URINALYSIS, ROUTINE W REFLEX MICROSCOPIC    EKG None  Radiology CT HEAD WO CONTRAST (5MM)  Result Date: 04/05/2021 CLINICAL DATA:  Mental status change, unknown cause.  Found down. EXAM: CT HEAD WITHOUT CONTRAST TECHNIQUE: Contiguous axial images were obtained from the base of the skull through the vertex  without intravenous contrast. COMPARISON:  02/04/2003 FINDINGS: Brain: No evidence of acute infarction, hemorrhage, hydrocephalus, extra-axial collection or mass lesion/mass effect. Vascular: No hyperdense vessel or unexpected calcification. Skull: Normal. Negative for fracture or focal lesion. Sinuses/Orbits: Minimal mucosal thickening in the paranasal sinuses. Other: None. IMPRESSION: Negative head CT. Electronically Signed   By: Richarda Overlie M.D.   On: 04/05/2021 10:06   DG Chest Portable 1 View  Result Date: 04/05/2021 CLINICAL DATA:  Cough and seizure. EXAM: PORTABLE CHEST 1 VIEW COMPARISON:  January 03, 2018 FINDINGS: The heart size and mediastinal contours are within normal limits. Both lungs are clear. The lungs are hyperinflated. The visualized skeletal structures are stable. IMPRESSION: No active disease. Electronically Signed   By: Sherian Rein M.D.   On: 04/05/2021 09:37    Procedures Procedures    CRITICAL CARE Performed by: Canary Brim Valyncia Wiens Total critical care time: 35 minutes Critical care time was exclusive of separately billable procedures and treating other patients. Critical care was necessary to treat or prevent imminent or life-threatening deterioration. Critical care was time spent personally by me on the following activities: development of treatment plan with patient and/or surrogate as well as nursing, discussions with  consultants, evaluation of patient's response to treatment, examination of patient, obtaining history from patient or surrogate, ordering and performing treatments and interventions, ordering and review of laboratory studies, ordering and review of radiographic studies, pulse oximetry and re-evaluation of patient's condition.  Medications Ordered in ED Medications  levETIRAcetam (KEPPRA) IVPB 1000 mg/100 mL premix (has no administration in time range)  LORazepam (ATIVAN) injection 2 mg (2 mg Intravenous Given 04/05/21 1242)    ED Course/ Medical Decision Making/ A&P                           Medical Decision Making  Steven Christensen is a 51 y.o. male with a past medical history significant for paranoid schizophrenia, diabetes, polysubstance abuse, anxiety, depression, bipolar disorder who presents with altered mental status, possible seizures, and left-sided neurologic deficits.  According to patient, he has had left-sided numbness and weakness for the last few days.  He reports that he laid down this morning and was found by mother with altered mental status.  He is still reporting left-sided numbness and weakness in his left arm and left leg as well as some numbness in his left face.  He reports he is having moderate to severe headache which is new but he cannot tell when it started.  He reports no nausea or vomiting, speech difficulties or vision changes reported.  He is having no chest pain or palpitations.  No abdominal pain, nausea, vomiting.  He denies any constipation, diarrhea, or urinary changes.  He does say that he has been having some cough for a while.  He denies other complaints on arrival.  He does report that last night he drank alcohol and did cocaine.  Per EMS, patient's blood pressure was 240 systolic on arrival.  They report he had 3 focal seizures with facial twitching and some stiffening and arms that initially resolved but then was terminated with midazolam.  Patient has not had  any further episodes.  Patient does report that he had a history of when he was very young but has not had any in many years.  He also says that he does not take any of his medicine for several years.  On exam, lungs are clear and chest is nontender.  Abdomen  is nontender.  Patient has symmetric smile and pupils are symmetric and reactive normal extraocular movements however he reports numbness in his left face, left arm, left leg.  He has decree strength in left arm and left leg.  He has intact pulses in extremities.  No tenderness on exam.  EMS reports glucose was high so we will also do work-up to look for DKA or other cause of altered mental status.  Clinically I do feel need to rule out a hypertensive urgency versus press versus stroke given the blood pressure of 240 and these neurologic deficits.  We will get work-up to look for other cause of symptoms as well however we will initially get a head CT.  If CT is reassuring, anticipate MRI and will discuss with neurology any other work-up that may be needed due to the relatively new neurologic deficits that appear to be persistent.   Given the suspected seizure with EMS, Todd's paralysis also considered causing some of the weakness on the left side.  Anticipate reassessment after work-up to determine disposition.  Initial CT was reassuring.  I spoke to neurology who recommended MRI without contrast to further evaluate.  Blood pressure has improved so we will continue to monitor that.  Otherwise, despite beta hydroxybutyric acid being elevated, he does not appear to be in DKA.  TSH is slightly low but otherwise CBC and metabolic panel reassuring aside from some hyperglycemia.  Will await MRI results and then determine disposition.  Patient tried to go get MRI but then had seizure-like activity in the scanner.  Patient was twitching his face and then stiffening.  Patient was brought back.  I saw patient and he was indeed twitching his face with some  altered responsiveness.  2 mg of Ativan were given.  Neurology was called to come see patient and they saw him.  They think that he does indeed need the MRI as well as EEG.  This was ordered.  They talked to the patient who says he does have twitching every now and then so they are not convinced he is having seizures.  They requested MRI, EEG, and then reassessment to determine if he is safe for discharge home or needs to be admitted.  They did recommend loading with Keppra now with 1 g and then likely 500 twice daily going forward either with or without admission.  Care will be transferred to oncoming team while waiting for results of MRI and EEG and neurology reassessment.         Final Clinical Impression(s) / ED Diagnoses Final diagnoses:  Transient alteration of awareness  Seizure-like activity (HCC)  Weakness of left side of body  Numbness on left side     Clinical Impression: 1. Transient alteration of awareness   2. Seizure-like activity (HCC)   3. Weakness of left side of body   4. Numbness on left side     Disposition: Care will be transferred to oncoming team while waiting for results of MRI and EEG and neurology reassessment.  This note was prepared with assistance of Conservation officer, historic buildingsDragon voice recognition software. Occasional wrong-word or sound-a-like substitutions may have occurred due to the inherent limitations of voice recognition software.     Uel Davidow, Canary Brimhristopher J, MD 04/05/21 [id number]

## 2021-04-05 NOTE — Consult Note (Signed)
Neurology Consultation  Reason for Consult: Seizures Referring Physician: Dr. Rush Landmarkegeler  CC: Chest pain and left-sided numbness prior to hospital arrival  History is obtained from: Patient, Chart review  HPI: Steven Christensen is a 51 y.o. male with a medial history significant for anxiety, bipolar disorder, type 2 diabetes mellitus, paranoid schizophrenia, childhood seizures, medication non-adherence, ETOH abuse, and polysubstance abuse who presented to the ED 04/05/2021 for evaluation of chest pain, numbness on the left side of his body for 2 days, and "felt like I couldn't breathe". Patient states that he began noticing left-sided numbness approximately 2 days ago. He states that he went out last night drinking (drinks clear liquor daily for multiple years) and used cocaine. This morning, the patient experienced a sudden onset of chest pain with shortness of breath and was found unresponsive on the floor by his mother who activated EMS. En route, the patient had 3 episodes of facial twitching with amnesia to these events with subsequent ridigity and received 2.5 mg of Versed. On arrival to the ED, patient was found to have a systolic blood pressure in the 240's. Patient was waiting for MRI imaing and had another episode of facial twitching that was aborted with the use of Ativan and neurology was consulted for further evaluation. Per EDP, on evaluation initially, patient was not moving his left upper extremity.   Patient reports a history of childhood seizures that would start with him shaking and then he would "black out". He states his last seizure was when he was in his teens. He does state that he often has facial twitching and then it feels like his face "gets stuck" following but he is cognizant throughout these episodes. He states that while having facial twitching today, he does not recall these events but feels confused for 1-2 minutes after being told he was having a seizure. On evaluation, he does  not endorse left-sided weakness or sensory deficits.   ROS: A complete ROS was performed and is negative except as noted in the HPI.   Past Medical History:  Diagnosis Date   Anxiety    Bipolar affective (HCC)    Depression    Diabetes mellitus without complication (HCC)    Schizophrenia (HCC)    Sleeping difficulties    History reviewed. No pertinent surgical history.  Family History  Problem Relation Age of Onset   Diabetes Maternal Aunt    Mental illness Neg Hx    Social History:   reports that he has been smoking cigarettes. He has a 35.00 pack-year smoking history. He has never used smokeless tobacco. He reports current alcohol use. He reports current drug use. Drugs: "Crack" cocaine and Marijuana.  Medications No current facility-administered medications for this encounter.  Current Outpatient Medications:    ACCU-CHEK GUIDE test strip, 3 (three) times daily., Disp: , Rfl:    gabapentin (NEURONTIN) 300 MG capsule, Take 300 mg by mouth 3 (three) times daily., Disp: , Rfl:    insulin aspart (NOVOLOG) 100 UNIT/ML injection, Inject 4 Units into the skin 3 (three) times daily with meals. (Patient not taking: Reported on 05/24/2020), Disp: 10 mL, Rfl: 1   insulin glargine (LANTUS) 100 UNIT/ML injection, Inject 0.4 mLs (40 Units total) into the skin every morning., Disp: 10 mL, Rfl: 1   losartan (COZAAR) 50 MG tablet, Take 50 mg by mouth daily., Disp: , Rfl:    metFORMIN (GLUCOPHAGE) 500 MG tablet, Take 1 tablet (500 mg total) by mouth 2 (two) times daily with a  meal., Disp: 60 tablet, Rfl: 0   risperiDONE (RISPERDAL) 2 MG tablet, Take 1 tablet (2 mg total) by mouth at bedtime., Disp: 10 tablet, Rfl: 0   tiZANidine (ZANAFLEX) 4 MG tablet, Take 4 mg by mouth at bedtime. Muscle spasm, Disp: , Rfl:    triamcinolone cream (KENALOG) 0.5 %, Apply topically 2 (two) times daily as needed., Disp: , Rfl:    Vitamin D, Ergocalciferol, (DRISDOL) 1.25 MG (50000 UNIT) CAPS capsule, Take 50,000  Units by mouth every Monday., Disp: , Rfl:   Exam: Current vital signs: BP (!) 161/76    Pulse (!) 58    Temp 98.5 F (36.9 C) (Oral)    Resp 18    Ht 6\' 4"  (1.93 m)    Wt 90.7 kg    SpO2 97%    BMI 24.34 kg/m  Vital signs in last 24 hours: Temp:  [98.5 F (36.9 C)] 98.5 F (36.9 C) (01/01 0904) Pulse Rate:  [51-65] 58 (01/01 1400) Resp:  [0-24] 18 (01/01 1400) BP: (150-179)/(76-93) 161/76 (01/01 1400) SpO2:  [97 %-99 %] 97 % (01/01 1400) Weight:  [90.7 kg] 90.7 kg (01/01 0907)  GENERAL: Awake, alert, in no acute distress Psych: Affect appropriate for situation, patient is calm and cooperative with examination Head: Normocephalic and atraumatic, without obvious abnormality EENT: Normal conjunctivae, dry mucous membranes, no OP obstruction, poor dentition noted LUNGS: Normal respiratory effort. Non-labored breathing on room air CV: Regular rate and rhythm on telemetry ABDOMEN: Soft, non-tender, non-distended Extremities: warm, well perfused, without obvious deformity  NEURO:  Mental Status: Awake, alert, and oriented to person, place, time, and situation. He is able to provide a clear and coherent history of present illness. Speech/Language: speech is intact and at baseline. Naming, repetition, fluency, and comprehension intact without aphasia. No neglect is noted. Cranial Nerves:  II: PERRL. Visual fields full.  III, IV, VI: EOMI without ptosis, nystagmus, or gaze preference.  V: Sensation is intact to light touch and symmetrical to face.  VII: Face is symmetric resting and smiling. VIII: Hearing is intact to voice IX, X: Palate elevation is symmetric. Phonation normal.  XI: Normal sternocleidomastoid and trapezius muscle strength XII: Tongue protrudes midline without fasciculations.   Motor: 5/5 strength is all muscle groups without vertical drift  Tone is normal. Bulk is normal.  Sensation: Intact to light touch bilaterally in all four extremities. Patient does endorse  bilateral feet painful paresthesias with a reported history of neuropathy (not currently adherent with any of his medications)  DTRs: 2+ and symmetric throughout.  Gait: Deferred  Labs I have reviewed labs in epic and the results pertinent to this consultation are: CBC    Component Value Date/Time   WBC 6.6 04/05/2021 0916   RBC 4.79 04/05/2021 0916   HGB 15.0 04/05/2021 0922   HGB 15.9 07/09/2013 1044   HCT 44.0 04/05/2021 0922   HCT 47.9 07/09/2013 1044   PLT 149 (L) 04/05/2021 0916   PLT 174 07/09/2013 1044   MCV 93.3 04/05/2021 0916   MCV 92 07/09/2013 1044   MCH 30.5 04/05/2021 0916   MCHC 32.7 04/05/2021 0916   RDW 13.8 04/05/2021 0916   RDW 13.8 07/09/2013 1044   LYMPHSABS 1.1 04/05/2021 0916   MONOABS 0.4 04/05/2021 0916   EOSABS 0.1 04/05/2021 0916   BASOSABS 0.0 04/05/2021 0916   CMP     Component Value Date/Time   NA 140 04/05/2021 0922   NA 139 07/09/2013 1044   K 3.5 04/05/2021 06/03/2021  K 3.9 07/09/2013 1044   CL 105 04/05/2021 0916   CL 107 07/09/2013 1044   CO2 24 04/05/2021 0916   CO2 26 07/09/2013 1044   GLUCOSE 255 (H) 04/05/2021 0916   GLUCOSE 244 (H) 07/09/2013 1044   BUN 8 04/05/2021 0916   BUN 6 (L) 07/09/2013 1044   CREATININE 0.79 04/05/2021 0916   CREATININE 0.77 04/25/2020 0000   CALCIUM 8.8 (L) 04/05/2021 0916   CALCIUM 8.9 07/09/2013 1044   PROT 6.5 04/05/2021 0916   PROT 7.7 07/09/2013 1044   ALBUMIN 3.5 04/05/2021 0916   ALBUMIN 3.5 07/09/2013 1044   AST 21 04/05/2021 0916   AST 23 07/09/2013 1044   ALT 15 04/05/2021 0916   ALT 22 07/09/2013 1044   ALKPHOS 70 04/05/2021 0916   ALKPHOS 133 (H) 07/09/2013 1044   BILITOT 1.3 (H) 04/05/2021 0916   BILITOT 0.3 07/09/2013 1044   GFRNONAA >60 04/05/2021 0916   GFRNONAA 104 04/25/2020 0000   GFRAA 121 04/25/2020 0000   Lipid Panel     Component Value Date/Time   CHOL 165 05/23/2020 2340   TRIG 69 05/23/2020 2340   HDL 48 05/23/2020 2340   CHOLHDL 3.4 05/23/2020 2340   VLDL 14  05/23/2020 2340   LDLCALC 103 (H) 05/23/2020 2340   LDLCALC 72 04/25/2020 0000   Lab Results  Component Value Date   HGBA1C 10.5 (H) 05/23/2020   Imaging I have reviewed the images obtained:  CT-scan of the brain 1/1: Negative head CT.  MRI examination of the brain pending  Routine EEG 1/1: "This study is within normal limits. No seizures or epileptiform discharges were seen throughout the recording."   Assessment: 51 y.o. male with history as above who presented to the ED for evaluation of unresponsiveness following sudden onset of chest pain and shortness of breath this morning with concern for 3 focal seizures en route with EMS and one while in the ED. Patient does not have recollection of these events but does report a history of childhood seizures and recent facial twitching but without loss of awareness. - Examination reveals patient without focal neurologic deficits on neurology evaluation s/p ativan administration for concern for focal seizure while in MRI - Presentation concerning for focal seizures in the setting of substance abuse (reported alcohol use and cocaine use overnight).  - Will obtain MRI brain when able for further evaluation. - Routine EEG obtained that is within normal limits without seizures or epileptiform discharges - With reported history of seizures and with recent ingestion of cocaine with concern for multiple focal seizures today, loaded patient with Keppra in the ED. Due to history of suicidal ideation, Keppra is not a good choice for AED medication. Will initiate Vimpat maintenance dosing for further AED therapy.    Recommendations: - MRI brain when able to obtain  - Loaded with Keppra in the ED 1,000 mg  - Will initiate Vimpat at 100 BID as above - Seizure precautions - Will need to follow up with outpatient neurology for further AED management - CIWA protocol for ETOH withdrawal   Lanae Boast, AGAC-NP [facility] Pager: [phone]  The patient was seen and examined with Lanae Boast, AGAC-NP. The chart and diagnostic studies were reviewed, discussed assessment and agree with plan as plan as outlined in the note above.   Electronically signed by:  Marisue Humble, MD Page: [phone] 04/05/2021, 5:31 PM

## 2021-04-05 NOTE — ED Notes (Signed)
Pt stated he wanted to leave.

## 2021-04-05 NOTE — Procedures (Signed)
Patient Name: Steven Christensen  MRN: [id number]  Epilepsy Attending: Charlsie Quest  Referring Physician/Provider: Dr Cristal Deer Tegeler Date: 04/05/2021 Duration: 22.13 mins  Patient history: 51 y.o. male with a past medical history significant for paranoid schizophrenia, diabetes, polysubstance abuse, anxiety, depression, bipolar disorder who presents with altered mental status, possible seizures, and left-sided neurologic deficits. EEG to evaluate for seizure  Level of alertness: Awake, asleep  AEDs during EEG study: LEV  Technical aspects: This EEG study was done with scalp electrodes positioned according to the 10-20 International system of electrode placement. Electrical activity was acquired at a sampling rate of 500Hz  and reviewed with a high frequency filter of 70Hz  and a low frequency filter of 1Hz . EEG data were recorded continuously and digitally stored.   Description: The posterior dominant rhythm consists of 9-10 Hz activity of moderate voltage (25-35 uV) seen predominantly in posterior head regions, symmetric and reactive to eye opening and eye closing. Sleep was characterized by vertex waves, sleep spindles (12 to 14 Hz), maximal frontocentral region. Hyperventilation and photic stimulation were not performed.     IMPRESSION: This study is within normal limits. No seizures or epileptiform discharges were seen throughout the recording.  [name] 

## 2021-04-05 NOTE — ED Notes (Signed)
Now patient has made his way back to treatment room

## 2021-04-05 NOTE — ED Notes (Signed)
No complaints. No seizure activity noted at this time. EEG tech at bedside to do study.

## 2022-06-02 ENCOUNTER — Inpatient Hospital Stay (HOSPITAL_COMMUNITY)
Admission: AD | Admit: 2022-06-02 | Discharge: 2022-06-06 | DRG: 897 | Disposition: A | Discharge status: Home / Self Care | Payer: Medicaid Other | Source: Intra-hospital | Attending: Psychiatry | Admitting: Psychiatry

## 2022-06-02 ENCOUNTER — Ambulatory Visit (HOSPITAL_COMMUNITY)
Admission: EM | Admit: 2022-06-02 | Discharge: 2022-06-02 | Disposition: A | Discharge status: Other Acute Inpatient Hospital | Payer: Medicaid Other | Attending: Psychiatry | Admitting: Psychiatry

## 2022-06-02 ENCOUNTER — Other Ambulatory Visit: Payer: Self-pay

## 2022-06-02 DIAGNOSIS — F209 Schizophrenia, unspecified: Secondary | ICD-10-CM | POA: Diagnosis not present

## 2022-06-02 DIAGNOSIS — F200 Paranoid schizophrenia: Secondary | ICD-10-CM | POA: Diagnosis present

## 2022-06-02 DIAGNOSIS — F10239 Alcohol dependence with withdrawal, unspecified: Principal | ICD-10-CM | POA: Diagnosis present

## 2022-06-02 DIAGNOSIS — Z9151 Personal history of suicidal behavior: Secondary | ICD-10-CM | POA: Insufficient documentation

## 2022-06-02 DIAGNOSIS — R45850 Homicidal ideations: Secondary | ICD-10-CM | POA: Insufficient documentation

## 2022-06-02 DIAGNOSIS — Z833 Family history of diabetes mellitus: Secondary | ICD-10-CM

## 2022-06-02 DIAGNOSIS — Z1152 Encounter for screening for COVID-19: Secondary | ICD-10-CM | POA: Diagnosis not present

## 2022-06-02 DIAGNOSIS — F1994 Other psychoactive substance use, unspecified with psychoactive substance-induced mood disorder: Secondary | ICD-10-CM | POA: Diagnosis present

## 2022-06-02 DIAGNOSIS — R45851 Suicidal ideations: Secondary | ICD-10-CM | POA: Insufficient documentation

## 2022-06-02 DIAGNOSIS — F1910 Other psychoactive substance abuse, uncomplicated: Secondary | ICD-10-CM

## 2022-06-02 DIAGNOSIS — G4700 Insomnia, unspecified: Secondary | ICD-10-CM | POA: Diagnosis present

## 2022-06-02 DIAGNOSIS — F1020 Alcohol dependence, uncomplicated: Secondary | ICD-10-CM | POA: Diagnosis not present

## 2022-06-02 DIAGNOSIS — E119 Type 2 diabetes mellitus without complications: Secondary | ICD-10-CM | POA: Insufficient documentation

## 2022-06-02 DIAGNOSIS — F1911 Other psychoactive substance abuse, in remission: Secondary | ICD-10-CM | POA: Insufficient documentation

## 2022-06-02 DIAGNOSIS — F1424 Cocaine dependence with cocaine-induced mood disorder: Secondary | ICD-10-CM | POA: Diagnosis present

## 2022-06-02 DIAGNOSIS — I10 Essential (primary) hypertension: Secondary | ICD-10-CM | POA: Diagnosis present

## 2022-06-02 DIAGNOSIS — F17210 Nicotine dependence, cigarettes, uncomplicated: Secondary | ICD-10-CM | POA: Diagnosis present

## 2022-06-02 DIAGNOSIS — K30 Functional dyspepsia: Secondary | ICD-10-CM | POA: Diagnosis present

## 2022-06-02 DIAGNOSIS — Z79899 Other long term (current) drug therapy: Secondary | ICD-10-CM | POA: Diagnosis not present

## 2022-06-02 DIAGNOSIS — F1290 Cannabis use, unspecified, uncomplicated: Secondary | ICD-10-CM | POA: Insufficient documentation

## 2022-06-02 DIAGNOSIS — F1420 Cocaine dependence, uncomplicated: Secondary | ICD-10-CM | POA: Diagnosis present

## 2022-06-02 DIAGNOSIS — Z7984 Long term (current) use of oral hypoglycemic drugs: Secondary | ICD-10-CM

## 2022-06-02 DIAGNOSIS — F32A Depression, unspecified: Secondary | ICD-10-CM | POA: Insufficient documentation

## 2022-06-02 DIAGNOSIS — Z794 Long term (current) use of insulin: Secondary | ICD-10-CM

## 2022-06-02 LAB — POCT URINE DRUG SCREEN - MANUAL ENTRY (I-SCREEN)
POC Amphetamine UR: NOT DETECTED
POC Buprenorphine (BUP): NOT DETECTED
POC Cocaine UR: POSITIVE — AB
POC Marijuana UR: POSITIVE — AB
POC Methadone UR: NOT DETECTED
POC Methamphetamine UR: NOT DETECTED
POC Morphine: NOT DETECTED
POC Oxazepam (BZO): NOT DETECTED
POC Oxycodone UR: NOT DETECTED
POC Secobarbital (BAR): NOT DETECTED

## 2022-06-02 LAB — LIPID PANEL
Cholesterol: 138 mg/dL (ref 0–200)
HDL: 69 mg/dL (ref >40)
LDL Cholesterol: 57 mg/dL (ref 0–99)
Total CHOL/HDL Ratio: 2 RATIO
Triglycerides: 61 mg/dL (ref <150)
VLDL: 12 mg/dL (ref 0–40)

## 2022-06-02 LAB — COMPREHENSIVE METABOLIC PANEL
ALT: 27 U/L (ref 0–44)
AST: 32 U/L (ref 15–41)
Albumin: 3.7 g/dL (ref 3.5–5.0)
Alkaline Phosphatase: 94 U/L (ref 38–126)
Anion gap: 11 (ref 5–15)
BUN: 6 mg/dL (ref 6–20)
CO2: 23 mmol/L (ref 22–32)
Calcium: 8.9 mg/dL (ref 8.9–10.3)
Chloride: 103 mmol/L (ref 98–111)
Creatinine, Ser: 0.68 mg/dL (ref 0.61–1.24)
GFR, Estimated: >60 mL/min (ref >60)
Glucose, Bld: 214 mg/dL — ABNORMAL HIGH (ref 70–99)
Potassium: 3.9 mmol/L (ref 3.5–5.1)
Sodium: 137 mmol/L (ref 135–145)
Total Bilirubin: <0.1 mg/dL — ABNORMAL LOW (ref 0.3–1.2)
Total Protein: 7 g/dL (ref 6.5–8.1)

## 2022-06-02 LAB — CBC WITH DIFFERENTIAL/PLATELET
Abs Immature Granulocytes: 0.01 10*3/uL (ref 0.00–0.07)
Basophils Absolute: 0 10*3/uL (ref 0.0–0.1)
Basophils Relative: 0 %
Eosinophils Absolute: 0.1 10*3/uL (ref 0.0–0.5)
Eosinophils Relative: 1 %
HCT: 46.5 % (ref 39.0–52.0)
Hemoglobin: 16 g/dL (ref 13.0–17.0)
Immature Granulocytes: 0 %
Lymphocytes Relative: 31 %
Lymphs Abs: 2.5 10*3/uL (ref 0.7–4.0)
MCH: 31.3 pg (ref 26.0–34.0)
MCHC: 34.4 g/dL (ref 30.0–36.0)
MCV: 91 fL (ref 80.0–100.0)
Monocytes Absolute: 0.4 10*3/uL (ref 0.1–1.0)
Monocytes Relative: 5 %
Neutro Abs: 5.1 10*3/uL (ref 1.7–7.7)
Neutrophils Relative %: 63 %
Platelets: 216 10*3/uL (ref 150–400)
RBC: 5.11 MIL/uL (ref 4.22–5.81)
RDW: 14.2 % (ref 11.5–15.5)
WBC: 8.1 10*3/uL (ref 4.0–10.5)
nRBC: 0 % (ref 0.0–0.2)

## 2022-06-02 LAB — TSH: TSH: 0.374 u[IU]/mL (ref 0.350–4.500)

## 2022-06-02 LAB — RESP PANEL BY RT-PCR (RSV, FLU A&B, COVID)  RVPGX2
Influenza A by PCR: NEGATIVE
Influenza B by PCR: NEGATIVE
Resp Syncytial Virus by PCR: NEGATIVE
SARS Coronavirus 2 by RT PCR: NEGATIVE

## 2022-06-02 LAB — GLUCOSE, CAPILLARY
Glucose-Capillary: 199 mg/dL — ABNORMAL HIGH (ref 70–99)
Glucose-Capillary: 259 mg/dL — ABNORMAL HIGH (ref 70–99)

## 2022-06-02 LAB — POC SARS CORONAVIRUS 2 AG: SARSCOV2ONAVIRUS 2 AG: NEGATIVE

## 2022-06-02 LAB — ETHANOL: Alcohol, Ethyl (B): 104 mg/dL — ABNORMAL HIGH (ref <10)

## 2022-06-02 MED ORDER — ACETAMINOPHEN 325 MG PO TABS: 650.0000 mg | ORAL_TABLET | Freq: Four times a day (QID) | ORAL | Status: DC | PRN

## 2022-06-02 MED ORDER — ZIPRASIDONE MESYLATE 20 MG IM SOLR: 20.0000 mg | INTRAMUSCULAR | Status: DC | PRN

## 2022-06-02 MED ORDER — ADULT MULTIVITAMIN W/MINERALS CH
1.0000 | ORAL_TABLET | Freq: Every day | ORAL | Status: DC
  Administered 2022-06-02: 1 via ORAL
  Filled 2022-06-02 (×3): qty 1

## 2022-06-02 MED ORDER — LORAZEPAM 1 MG PO TABS: 1.0000 mg | ORAL_TABLET | ORAL | Status: DC | PRN

## 2022-06-02 MED ORDER — DIPHENHYDRAMINE HCL 25 MG PO CAPS
50.0000 mg | ORAL_CAPSULE | Freq: Three times a day (TID) | ORAL | Status: DC | PRN
  Administered 2022-06-04: 50 mg via ORAL
  Filled 2022-06-02: qty 2

## 2022-06-02 MED ORDER — LORAZEPAM 1 MG PO TABS: 1.0000 mg | ORAL_TABLET | Freq: Three times a day (TID) | ORAL | Status: DC

## 2022-06-02 MED ORDER — ALUM & MAG HYDROXIDE-SIMETH 200-200-20 MG/5ML PO SUSP: 30.0000 mL | ORAL | Status: DC | PRN

## 2022-06-02 MED ORDER — ONDANSETRON 4 MG PO TBDP: 4.0000 mg | ORAL_TABLET | Freq: Four times a day (QID) | ORAL | Status: DC | PRN

## 2022-06-02 MED ORDER — LORAZEPAM 2 MG/ML IJ SOLN
2.0000 mg | Freq: Three times a day (TID) | INTRAMUSCULAR | Status: DC | PRN
  Filled 2022-06-02: qty 1

## 2022-06-02 MED ORDER — TRAZODONE HCL 50 MG PO TABS
50.0000 mg | ORAL_TABLET | Freq: Every evening | ORAL | Status: DC | PRN
  Administered 2022-06-04: 50 mg via ORAL
  Filled 2022-06-02: qty 1

## 2022-06-02 MED ORDER — LOPERAMIDE HCL 2 MG PO CAPS: 2.0000 mg | ORAL_CAPSULE | ORAL | Status: DC | PRN

## 2022-06-02 MED ORDER — HYDROXYZINE HCL 25 MG PO TABS: 25.0000 mg | ORAL_TABLET | Freq: Three times a day (TID) | ORAL | Status: DC | PRN

## 2022-06-02 MED ORDER — CLONIDINE HCL 0.1 MG PO TABS
0.1000 mg | ORAL_TABLET | ORAL | Status: DC | PRN
  Administered 2022-06-03 – 2022-06-04 (×3): 0.1 mg via ORAL
  Filled 2022-06-02 (×3): qty 1

## 2022-06-02 MED ORDER — RISPERIDONE 1 MG PO TBDP
1.0000 mg | ORAL_TABLET | Freq: Two times a day (BID) | ORAL | Status: DC
  Administered 2022-06-02 – 2022-06-06 (×8): 1 mg via ORAL
  Filled 2022-06-02 (×12): qty 1

## 2022-06-02 MED ORDER — LORAZEPAM 1 MG PO TABS: 1.0000 mg | ORAL_TABLET | Freq: Four times a day (QID) | ORAL | Status: DC | PRN

## 2022-06-02 MED ORDER — ACETAMINOPHEN 325 MG PO TABS
650.0000 mg | ORAL_TABLET | Freq: Four times a day (QID) | ORAL | Status: DC | PRN
  Administered 2022-06-05: 650 mg via ORAL
  Filled 2022-06-02: qty 2

## 2022-06-02 MED ORDER — LORAZEPAM 1 MG PO TABS
2.0000 mg | ORAL_TABLET | Freq: Three times a day (TID) | ORAL | Status: DC | PRN
  Administered 2022-06-04: 2 mg via ORAL
  Filled 2022-06-02: qty 2

## 2022-06-02 MED ORDER — METFORMIN HCL 500 MG PO TABS
500.0000 mg | ORAL_TABLET | Freq: Two times a day (BID) | ORAL | Status: DC
  Administered 2022-06-02 – 2022-06-06 (×8): 500 mg via ORAL
  Filled 2022-06-02 (×11): qty 1

## 2022-06-02 MED ORDER — LORAZEPAM 1 MG PO TABS
1.0000 mg | ORAL_TABLET | Freq: Four times a day (QID) | ORAL | Status: AC | PRN
  Filled 2022-06-02: qty 1

## 2022-06-02 MED ORDER — GLUCOSE BLOOD VI STRP: 1.0000 | ORAL_STRIP | Freq: Three times a day (TID) | Status: DC

## 2022-06-02 MED ORDER — ADULT MULTIVITAMIN W/MINERALS CH
1.0000 | ORAL_TABLET | Freq: Every day | ORAL | Status: DC
  Administered 2022-06-03 – 2022-06-06 (×4): 1 via ORAL
  Filled 2022-06-02 (×7): qty 1

## 2022-06-02 MED ORDER — LORAZEPAM 1 MG PO TABS
1.0000 mg | ORAL_TABLET | Freq: Four times a day (QID) | ORAL | Status: AC
  Administered 2022-06-03 (×2): 1 mg via ORAL
  Filled 2022-06-02 (×2): qty 1

## 2022-06-02 MED ORDER — RISPERIDONE 1 MG PO TBDP
1.0000 mg | ORAL_TABLET | Freq: Two times a day (BID) | ORAL | Status: DC
  Administered 2022-06-02: 1 mg via ORAL
  Filled 2022-06-02: qty 1

## 2022-06-02 MED ORDER — TRAZODONE HCL 50 MG PO TABS: 50.0000 mg | ORAL_TABLET | Freq: Every evening | ORAL | Status: DC | PRN

## 2022-06-02 MED ORDER — ONDANSETRON 4 MG PO TBDP: 4.0000 mg | ORAL_TABLET | Freq: Four times a day (QID) | ORAL | Status: AC | PRN

## 2022-06-02 MED ORDER — HYDROXYZINE HCL 25 MG PO TABS: 25.0000 mg | ORAL_TABLET | Freq: Four times a day (QID) | ORAL | Status: AC | PRN

## 2022-06-02 MED ORDER — LORAZEPAM 1 MG PO TABS
1.0000 mg | ORAL_TABLET | Freq: Three times a day (TID) | ORAL | Status: AC
  Administered 2022-06-03 – 2022-06-04 (×3): 1 mg via ORAL
  Filled 2022-06-02 (×3): qty 1

## 2022-06-02 MED ORDER — METFORMIN HCL 500 MG PO TABS: 500.0000 mg | ORAL_TABLET | Freq: Two times a day (BID) | ORAL | Status: DC

## 2022-06-02 MED ORDER — MAGNESIUM HYDROXIDE 400 MG/5ML PO SUSP: 30.0000 mL | Freq: Every day | ORAL | Status: DC | PRN

## 2022-06-02 MED ORDER — RISPERIDONE 2 MG PO TBDP: 2.0000 mg | ORAL_TABLET | Freq: Three times a day (TID) | ORAL | Status: DC | PRN

## 2022-06-02 MED ORDER — THIAMINE MONONITRATE 100 MG PO TABS: 100.0000 mg | ORAL_TABLET | Freq: Every day | ORAL | Status: DC

## 2022-06-02 MED ORDER — DIPHENHYDRAMINE HCL 50 MG/ML IJ SOLN
50.0000 mg | Freq: Three times a day (TID) | INTRAMUSCULAR | Status: DC | PRN
  Filled 2022-06-02: qty 1

## 2022-06-02 MED ORDER — LORAZEPAM 1 MG PO TABS: 1.0000 mg | ORAL_TABLET | Freq: Every day | ORAL | Status: DC

## 2022-06-02 MED ORDER — LORAZEPAM 1 MG PO TABS: 1.0000 mg | ORAL_TABLET | Freq: Four times a day (QID) | ORAL | Status: DC

## 2022-06-02 MED ORDER — VITAMIN B-1 100 MG PO TABS
100.0000 mg | ORAL_TABLET | Freq: Every day | ORAL | Status: DC
  Filled 2022-06-02: qty 1

## 2022-06-02 MED ORDER — LOPERAMIDE HCL 2 MG PO CAPS: 2.0000 mg | ORAL_CAPSULE | ORAL | Status: AC | PRN

## 2022-06-02 MED ORDER — ADULT MULTIVITAMIN W/MINERALS CH
1.0000 | ORAL_TABLET | Freq: Every day | ORAL | Status: DC
  Administered 2022-06-02: 1 via ORAL
  Filled 2022-06-02: qty 1

## 2022-06-02 MED ORDER — THIAMINE HCL 100 MG/ML IJ SOLN
100.0000 mg | Freq: Once | INTRAMUSCULAR | Status: AC
  Administered 2022-06-02: 100 mg via INTRAMUSCULAR
  Filled 2022-06-02: qty 2

## 2022-06-02 MED ORDER — HALOPERIDOL LACTATE 5 MG/ML IJ SOLN
5.0000 mg | Freq: Three times a day (TID) | INTRAMUSCULAR | Status: DC | PRN
  Filled 2022-06-02: qty 1

## 2022-06-02 MED ORDER — HALOPERIDOL 5 MG PO TABS
5.0000 mg | ORAL_TABLET | Freq: Three times a day (TID) | ORAL | Status: DC | PRN
  Administered 2022-06-04: 5 mg via ORAL
  Filled 2022-06-02: qty 1

## 2022-06-02 MED ORDER — LORAZEPAM 1 MG PO TABS
1.0000 mg | ORAL_TABLET | Freq: Two times a day (BID) | ORAL | Status: AC
  Administered 2022-06-04 – 2022-06-05 (×2): 1 mg via ORAL
  Filled 2022-06-02 (×2): qty 1

## 2022-06-02 MED ORDER — LORAZEPAM 1 MG PO TABS: 1.0000 mg | ORAL_TABLET | Freq: Two times a day (BID) | ORAL | Status: DC

## 2022-06-02 MED ORDER — VITAMIN B-1 100 MG PO TABS
100.0000 mg | ORAL_TABLET | Freq: Every day | ORAL | Status: DC
  Administered 2022-06-03 – 2022-06-06 (×4): 100 mg via ORAL
  Filled 2022-06-02 (×5): qty 1

## 2022-06-02 MED ORDER — HYDROXYZINE HCL 25 MG PO TABS: 25.0000 mg | ORAL_TABLET | Freq: Four times a day (QID) | ORAL | Status: DC | PRN

## 2022-06-02 MED ORDER — LORAZEPAM 1 MG PO TABS
1.0000 mg | ORAL_TABLET | Freq: Every day | ORAL | Status: AC
  Administered 2022-06-06: 1 mg via ORAL
  Filled 2022-06-02: qty 1

## 2022-06-02 NOTE — Plan of Care (Signed)
  Problem: Self-Concept: Goal: Ability to disclose and discuss suicidal ideas will improve Outcome: Progressing Goal: Will verbalize positive feelings about self Outcome: Progressing   Problem: Education: Goal: Knowledge of disease or condition will improve Outcome: Progressing Goal: Understanding of discharge needs will improve Outcome: Progressing
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
## 2022-06-02 NOTE — Progress Notes (Signed)
   06/02/22 2300  Psych Admission Type (Psych Patients Only)  Admission Status Involuntary  Psychosocial Assessment  Patient Complaints Substance abuse  Eye Contact Avoids  Facial Expression Anxious  Affect Angry;Depressed  Speech Slow  Interaction Minimal  Motor Activity Fidgety  Appearance/Hygiene Disheveled  Behavior Characteristics Guarded  Mood Depressed;Anxious;Suspicious  Thought Process  Coherency WDL  Content Delusions  Delusions Paranoid  Perception Hallucinations  Hallucination Auditory  Confusion Mild  Danger to Self  Current suicidal ideation? Denies  Agreement Not to Harm Self Yes  Description of Agreement verbal  Danger to Others  Danger to Others None reported or observed

## 2022-06-02 NOTE — Plan of Care (Signed)
  Problem: Self-Concept: Goal: Ability to disclose and discuss suicidal ideas will improve 06/02/2022 1558 by Luanna Salk, RN Outcome: Progressing 06/02/2022 1546 by Luanna Salk, RN Outcome: Progressing Goal: Will verbalize positive feelings about self 06/02/2022 1558 by Luanna Salk, RN Outcome: Progressing 06/02/2022 1546 by Luanna Salk, RN Outcome: Progressing   Problem: Education: Goal: Knowledge of disease or condition will improve 06/02/2022 1558 by Luanna Salk, RN Outcome: Progressing 06/02/2022 1546 by Luanna Salk, RN Outcome: Progressing Goal: Understanding of discharge needs will improve 06/02/2022 1558 by Luanna Salk, RN Outcome: Progressing 06/02/2022 1546 by Luanna Salk, RN Outcome: Progressing

## 2022-06-02 NOTE — Progress Notes (Signed)
Pt was accepted to [facility] 06/02/2022, pending IVC paperwork faxed to [phone]. Bed assignment: 404-1  Pt meets inpatient criteria per Elvin So, NP  Attending Physician will be Janine Limbo, MD  Report can be called to: - Adult unit: [phone]  Pt can arrive after pending items are received; [facility] AC to coordinate arrival time  Care Team Notified: [facility] Lynnda Shields, RN, Leonia Reader, RN and Elvin So, NP  Denna Haggard, [place]  06/02/2022 12:35 PM

## 2022-06-02 NOTE — Tx Team (Signed)
Initial Treatment Plan 06/02/2022 3:42 PM Steven Christensen [id number]    PATIENT STRESSORS: Health problems   Substance abuse     PATIENT STRENGTHS: Ability for insight  Active sense of humor    PATIENT IDENTIFIED PROBLEMS: Substance abuse- patient has been drinking and using cocaine                      DISCHARGE CRITERIA:  Verbal commitment to aftercare and medication compliance Withdrawal symptoms are absent or subacute and managed without 24-hour nursing intervention  PRELIMINARY DISCHARGE PLAN: Return to previous living arrangement Return to previous work or school arrangements  PATIENT/FAMILY INVOLVEMENT: This treatment plan has been presented to and reviewed with the patient, Steven Christensen.  The patient and family have been given the opportunity to ask questions and make suggestions.  Fritz Pickerel, RN 06/02/2022, 3:42 PM
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
## 2022-06-03 LAB — HEMOGLOBIN A1C
Hgb A1c MFr Bld: 9.6 % — ABNORMAL HIGH (ref 4.8–5.6)
Mean Plasma Glucose: 229 mg/dL

## 2022-06-03 LAB — GLUCOSE, CAPILLARY
Glucose-Capillary: 263 mg/dL — ABNORMAL HIGH (ref 70–99)
Glucose-Capillary: 148 mg/dL — ABNORMAL HIGH (ref 70–99)
Glucose-Capillary: 170 mg/dL — ABNORMAL HIGH (ref 70–99)
Glucose-Capillary: 129 mg/dL — ABNORMAL HIGH (ref 70–99)
Glucose-Capillary: 170 mg/dL — ABNORMAL HIGH (ref 70–99)

## 2022-06-03 MED ORDER — INSULIN ASPART 100 UNIT/ML IJ SOLN
4.0000 [IU] | Freq: Three times a day (TID) | INTRAMUSCULAR | Status: DC
  Administered 2022-06-03 – 2022-06-06 (×9): 4 [IU] via SUBCUTANEOUS

## 2022-06-03 MED ORDER — INSULIN GLARGINE-YFGN 100 UNIT/ML ~~LOC~~ SOLN
40.0000 [IU] | Freq: Every day | SUBCUTANEOUS | Status: DC
  Administered 2022-06-03 – 2022-06-05 (×2): 40 [IU] via SUBCUTANEOUS

## 2022-06-03 MED ORDER — GABAPENTIN 300 MG PO CAPS
300.0000 mg | ORAL_CAPSULE | Freq: Three times a day (TID) | ORAL | Status: DC
  Administered 2022-06-03 – 2022-06-06 (×10): 300 mg via ORAL
  Filled 2022-06-03 (×12): qty 1

## 2022-06-03 MED ORDER — INSULIN ASPART 100 UNIT/ML IJ SOLN
0.0000 [IU] | Freq: Three times a day (TID) | INTRAMUSCULAR | Status: DC
  Administered 2022-06-03: 3 [IU] via SUBCUTANEOUS
  Administered 2022-06-03 – 2022-06-04 (×2): 2 [IU] via SUBCUTANEOUS
  Administered 2022-06-04 (×2): 3 [IU] via SUBCUTANEOUS
  Administered 2022-06-05: 5 [IU] via SUBCUTANEOUS
  Administered 2022-06-05: 3 [IU] via SUBCUTANEOUS
  Administered 2022-06-05: 11 [IU] via SUBCUTANEOUS
  Administered 2022-06-06: 3 [IU] via SUBCUTANEOUS

## 2022-06-03 MED ORDER — INSULIN GLARGINE-YFGN 100 UNIT/ML ~~LOC~~ SOLN: 40.0000 [IU] | Freq: Every day | SUBCUTANEOUS | Status: DC

## 2022-06-03 MED ORDER — LOSARTAN POTASSIUM 50 MG PO TABS
50.0000 mg | ORAL_TABLET | Freq: Every day | ORAL | Status: DC
  Administered 2022-06-03 – 2022-06-06 (×4): 50 mg via ORAL
  Filled 2022-06-03 (×5): qty 1

## 2022-06-03 MED ORDER — VITAMIN D (ERGOCALCIFEROL) 1.25 MG (50000 UNIT) PO CAPS
50000.0000 [IU] | ORAL_CAPSULE | ORAL | Status: DC
  Filled 2022-06-03: qty 1
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
## 2022-06-03 NOTE — Progress Notes (Signed)
   06/03/22 0545  15 Minute Checks  Location Bedroom  Visual Appearance Calm  Behavior Sleeping  Sleep (Behavioral Health Patients Only)  Calculate sleep? (Click Yes once per 24 hr at 0600 safety check) Yes  Documented sleep last 24 hours 7.75

## 2022-06-03 NOTE — BHH Counselor (Signed)
CSW provided the Pt with a packet that contains information including shelter and housing resources, free and reduced price food information, clothing resources, crisis center information, a [place] card, and suicide prevention information.  CSW also provided the Pt with a list of local [place] and the phone numbers to [facility] ([place]), [employer], and [employer] in [place]. A referral will also be sent to [facility] on 06/03/2022.

## 2022-06-03 NOTE — Progress Notes (Signed)
   06/03/22 0900  Psych Admission Type (Psych Patients Only)  Admission Status Involuntary  Psychosocial Assessment  Patient Complaints Substance abuse  Eye Contact Avoids  Facial Expression Anxious  Affect Anxious;Depressed  Speech Slow  Interaction Minimal  Motor Activity Fidgety  Appearance/Hygiene Disheveled  Behavior Characteristics Cooperative  Mood Depressed;Anxious  Thought Process  Coherency WDL  Content Delusions  Delusions Paranoid  Perception WDL  Hallucination None reported or observed  Judgment Limited  Confusion Mild  Danger to Self  Current suicidal ideation? Denies  Agreement Not to Harm Self Yes  Description of Agreement verbal  Danger to Others  Danger to Others None reported or observed

## 2022-06-03 NOTE — Inpatient Diabetes Management (Signed)
Inpatient Diabetes Program Recommendations  AACE/ADA: New Consensus Statement on Inpatient Glycemic Control   Target Ranges:  Prepandial:   less than 140 mg/dL      Peak postprandial:   less than 180 mg/dL (1-2 hours)      Critically ill patients:  140 - 180 mg/dL    Latest Reference Range & Units 06/02/22 11:41 06/02/22 16:54 06/03/22 07:42  Glucose-Capillary 70 - 99 mg/dL 199 (H) 259 (H) 263 (H)    Review of Glycemic Control  Diabetes history: DM2 Outpatient Diabetes medications: Lantus 40 units QAM, Novolog 4 units TID with meals, Meformin 500 mg BID Current orders for Inpatient glycemic control: Semglee 40 units daily at HS, Novolog 0-15 units TID with meals, Novolog 4 units TID with meals, Metformin 500 mg BID  Inpatient Diabetes Program Recommendations:    Insulin: Please consider decreasing Semglee to 10 units QHS.   NOTE: Noted consult for diabetes coordinator for recommendations on DM medications as patient reported not taking medications. Initial glucose 199 mg/dl on 2/28 and fasting CBG 263 this morning. No insulin given at hospital since arrival on 06/02/22.  Would recommend decreasing Semglee to 10 units QHS and following glucose trends to determine if additional changes are needed with DM medications.  Thanks, Barnie Alderman, RN, MSN, [place] Diabetes Coordinator Inpatient Diabetes Program [phone] (Team Pager from 8am to [place])
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
## 2022-06-03 NOTE — BHH Suicide Risk Assessment (Signed)
Suicide Risk Assessment  Admission Assessment    [facility] Admission Suicide Risk Assessment   Nursing information obtained from:  Patient  Demographic factors:  Male, Low socioeconomic status  Current Mental Status:  NA  Loss Factors:  NA  Historical Factors:  NA  Risk Reduction Factors:  NA  Total Time spent with patient: 1 hour  Principal Problem: Substance induced mood disorder (HCC)  Diagnosis:  Principal Problem:   Substance induced mood disorder (HCC)  Subjective Data: See H&P  Continued Clinical Symptoms:  Alcohol Use Disorder Identification Test Final Score (AUDIT): 17 The "Alcohol Use Disorders Identification Test", Guidelines for Use in Primary Care, Second Edition.  World Pharmacologist [facility]). Score between 0-7:  no or low risk or alcohol related problems. Score between 8-15:  moderate risk of alcohol related problems. Score between 16-19:  high risk of alcohol related problems. Score 20 or above:  warrants further diagnostic evaluation for alcohol dependence and treatment.  CLINICAL FACTORS:   Bipolar Disorder:   Mixed State Alcohol/Substance Abuse/Dependencies More than one psychiatric diagnosis Unstable or Poor Therapeutic Relationship Previous Psychiatric Diagnoses and Treatments Medical Diagnoses and Treatments/Surgeries  Musculoskeletal: Strength & Muscle Tone: within normal limits Gait & Station: normal Patient leans: N/A  Psychiatric Specialty Exam:  Presentation  General Appearance:  Casual; Fairly Groomed  Eye Contact: Good  Speech: Clear and Coherent; Normal Rate  Speech Volume: Normal  Handedness: Right  Mood and Affect  Mood: Anxious; Depressed  Affect: Flat; Depressed; Congruent  Thought Process  Thought Processes: Coherent; Goal Directed; Linear  Descriptions of Associations:Intact  Orientation:Full (Time, Place and Person)  Thought Content:Logical  History of Schizophrenia/Schizoaffective disorder:No data  recorded  Duration of Psychotic Symptoms:Greater than six months  Hallucinations:Hallucinations: None Description of Auditory Hallucinations: NA  Ideas of Reference:None  Suicidal Thoughts:Suicidal Thoughts: No SI Active Intent and/or Plan: Without Intent; Without Plan; Without Means to Carry Out; Without Access to Means  Homicidal Thoughts:Homicidal Thoughts: No  Sensorium  Memory: Recent Good; Immediate Good; Remote Fair  Judgment: Fair  Insight: Fair  Community education officer  Concentration: Fair  Attention Span: Fair  Recall: [place] of Knowledge: Fair  Language: Good  Psychomotor Activity  Psychomotor Activity: Psychomotor Activity: Normal  Assets  Assets: Communication Skills; Desire for Improvement; Financial Resources/Insurance; Social Support; Housing  Sleep  Sleep: Sleep: Fair Number of Hours of Sleep: 5.5  Physical Exam:See H&P Blood pressure 132/79, pulse 68, temperature 98.2 F (36.8 C), temperature source Oral, resp. rate 20, height '6\' 3"'$  (1.905 m), weight 89.2 kg, SpO2 99 %. Body mass index is 24.57 kg/m.  COGNITIVE FEATURES THAT CONTRIBUTE TO RISK:  Closed-mindedness, Polarized thinking, and Thought constriction (tunnel vision)    SUICIDE RISK:   Severe:  Frequent, intense, and enduring suicidal ideation, specific plan, no subjective intent, but some objective markers of intent (i.e., choice of lethal method), the method is accessible, some limited preparatory behavior, evidence of impaired self-control, severe dysphoria/symptomatology, multiple risk factors present, and few if any protective factors, particularly a lack of social support.  PLAN OF CARE: See H&P  I certify that inpatient services furnished can reasonably be expected to improve the patient's condition.   Lindell Spar, NP, pmhnp, fnp-bc 06/03/2022, 12:54 PM

## 2022-06-03 NOTE — Group Note (Signed)
LCSW Group Therapy Note   Group Date: 06/03/2022 Start Time: 1100 End Time: 1200  Type of Therapy/Topic:  Group Therapy:  Balance in Life  Participation Level:  Active  Description of Group:    This group will address the concept of balance and how it feels and looks when one is unbalanced. Patients will be encouraged to process areas in their lives that are out of balance and identify reasons for remaining unbalanced. Facilitators will guide patients in utilizing problem-solving interventions to address and correct the stressor making their life unbalanced. Understanding and applying boundaries will be explored and addressed for obtaining and maintaining a balanced life. Patients will be encouraged to explore ways to assertively make their unbalanced needs known to significant others in their lives, using other group members and facilitator for support and feedback.  Therapeutic Goals: Patient will identify two or more emotions or situations they have that consume much of in their lives. Patient will identify two ways to set boundaries in order to achieve balance in their lives:   Summary of Patient Progress:  The Pt attended group and remained there the entire time.  The Pt accepted all worksheets and materials.  The Pt demonstrated understanding of the topic being discussed by asking questions and sharing their thoughts with their peers.  The Pt was appropriate with their peers and staff.     Therapeutic Modalities:   Cognitive Behavioral Therapy Solution-Focused Therapy Assertiveness Training  Darleen Crocker, [place] 06/03/2022  1:23 PM

## 2022-06-03 NOTE — Group Note (Signed)
Occupational Therapy Group Note  Group Topic:Coping Skills  Group Date: 06/03/2022 Start Time: 1430 End Time: 1500 Facilitators: Jayde Mcallister G, OT   Group Description: Group encouraged increased engagement and participation through discussion and activity focused on "Coping Ahead." Patients were split up into teams and selected a card from a stack of positive coping strategies. Patients were instructed to act out/charade the coping skill for other peers to guess and receive points for their team. Discussion followed with a focus on identifying additional positive coping strategies and patients shared how they were going to cope ahead over the weekend while continuing hospitalization stay.  Therapeutic Goal(s): Identify positive vs negative coping strategies. Identify coping skills to be used during hospitalization vs coping skills outside of hospital/at home Increase participation in therapeutic group environment and promote engagement in treatment   Participation Level: Engaged   Participation Quality: Independent   Behavior: Appropriate   Speech/Thought Process: Relevant   Affect/Mood: Appropriate   Insight: Fair   Judgement: Fair   Individualization: pt was engaged in their participation of group discussion/activity. New skills identified  Modes of Intervention: Education  Patient Response to Interventions:  Attentive   Plan: Continue to engage patient in OT groups 2 - 3x/week.  06/03/2022  [name], OT Lori Popowski, OT   

## 2022-06-03 NOTE — BHH Group Notes (Addendum)
Adult Psychoeducational Group Note  Date:  06/03/2022 Time:  9:25 AM  Group Topic/Focus:  Goals Group:   The focus of this group is to help patients establish daily goals to achieve during treatment and discuss how the patient can incorporate goal setting into their daily lives to aide in recovery.  Participation Level:  Active  Participation Quality:  Appropriate  Affect:  Appropriate  Cognitive:  Appropriate  Insight: Appropriate  Engagement in Group:  Engaged  Modes of Intervention:  Activity and Discussion  Additional Comments:   Pt was actively engaged throughout duration of group.   Victorino December 06/03/2022, 9:25 AM

## 2022-06-04 ENCOUNTER — Encounter (HOSPITAL_COMMUNITY): Payer: Self-pay

## 2022-06-04 DIAGNOSIS — F1020 Alcohol dependence, uncomplicated: Secondary | ICD-10-CM

## 2022-06-04 LAB — GLUCOSE, CAPILLARY
Glucose-Capillary: 176 mg/dL — ABNORMAL HIGH (ref 70–99)
Glucose-Capillary: 178 mg/dL — ABNORMAL HIGH (ref 70–99)
Glucose-Capillary: 166 mg/dL — ABNORMAL HIGH (ref 70–99)
Glucose-Capillary: 132 mg/dL — ABNORMAL HIGH (ref 70–99)
Glucose-Capillary: 87 mg/dL (ref 70–99)
Glucose-Capillary: 220 mg/dL — ABNORMAL HIGH (ref 70–99)

## 2022-06-04 MED ORDER — HYDROCORTISONE (PERIANAL) 2.5 % EX CREA
TOPICAL_CREAM | Freq: Three times a day (TID) | CUTANEOUS | Status: DC
  Administered 2022-06-04: 1 via TOPICAL
  Filled 2022-06-04: qty 28.35

## 2022-06-04 NOTE — Progress Notes (Signed)
Pt Bp elevated 173/107 , pt given PRN Clonidine per MAR and recheck Bp 144/81, will continue to monitor

## 2022-06-04 NOTE — Progress Notes (Signed)
Pt continues to agitated with Probation officer, even when Probation officer tries to explain the situation, pt will not listen to Probation officer and continues to escalate, so Probation officer had to remove self from the situation and another staff member Kennyth Lose) will assume responsibility for pt moving forward due to this writer agitating pt . Pt still believes Probation officer brought pt over here.

## 2022-06-04 NOTE — Progress Notes (Signed)
   06/04/22 0200  Psych Admission Type (Psych Patients Only)  Admission Status Involuntary  Psychosocial Assessment  Patient Complaints Substance abuse  Eye Contact Fair  Facial Expression Anxious  Affect Anxious  Speech Slow  Interaction Cautious  Motor Activity Fidgety  Appearance/Hygiene Disheveled  Behavior Characteristics Cooperative  Mood Depressed  Aggressive Behavior  Effect No apparent injury  Thought Process  Coherency WDL  Content Delusions  Delusions Paranoid  Perception WDL  Hallucination None reported or observed  Judgment Limited  Confusion Mild  Danger to Self  Current suicidal ideation? Denies

## 2022-06-04 NOTE — Progress Notes (Signed)
Ativan held until after provider assessment. Provider assessed patient and ativan administered, per provider. No adverse drug reactions noted. Patient remains safe at this time.

## 2022-06-04 NOTE — BH IP Treatment Plan (Signed)
Interdisciplinary Treatment and Diagnostic Plan Update  06/04/2022 Time of Session: [place] MRN: [id number]  Principal Diagnosis: Alcohol use disorder, moderate, dependence ([place])  Secondary Diagnoses: Principal Problem:   Alcohol use disorder, moderate, dependence (HCC) Active Problems:   Paranoid schizophrenia ([place])   Cocaine use disorder, moderate, dependence ([place])   Substance induced mood disorder (HCC)   Current Medications:  Current Facility-Administered Medications  Medication Dose Route Frequency Provider Last Rate Last Admin   acetaminophen (TYLENOL) tablet 650 mg  650 mg Oral Q6H PRN Tharon Aquas, NP       alum & mag hydroxide-simeth (MAALOX/MYLANTA) 200-200-20 MG/5ML suspension 30 mL  30 mL Oral Q4H PRN Tharon Aquas, NP       cloNIDine (CATAPRES) tablet 0.1 mg  0.1 mg Oral Q4H PRN Massengill, Ovid Curd, MD   0.1 mg at 06/04/22 0130   diphenhydrAMINE (BENADRYL) capsule 50 mg  50 mg Oral TID PRN Tharon Aquas, NP       Or   diphenhydrAMINE (BENADRYL) injection 50 mg  50 mg Intramuscular TID PRN Tharon Aquas, NP       gabapentin (NEURONTIN) capsule 300 mg  300 mg Oral TID Lindell Spar I, NP   300 mg at 06/04/22 1159   haloperidol (HALDOL) tablet 5 mg  5 mg Oral TID PRN Tharon Aquas, NP       Or   haloperidol lactate (HALDOL) injection 5 mg  5 mg Intramuscular TID PRN Tharon Aquas, NP       hydrocortisone (ANUSOL-HC) 2.5 % rectal cream   Topical TID Lindell Spar I, NP   1 Application at [id number]   hydrOXYzine (ATARAX) tablet 25 mg  25 mg Oral Q6H PRN Tharon Aquas, NP       insulin aspart (novoLOG) injection 0-15 Units  0-15 Units Subcutaneous TID WC Nwoko, Herbert Pun I, NP   3 Units at 06/04/22 1257   insulin aspart (novoLOG) injection 4 Units  4 Units Subcutaneous TID WC Lindell Spar I, NP   4 Units at 06/04/22 1258   insulin glargine-yfgn (SEMGLEE) injection 40 Units  40 Units Subcutaneous QHS Lindell Spar I, NP   40  Units at 06/03/22 2303   loperamide (IMODIUM) capsule 2-4 mg  2-4 mg Oral PRN Massengill, Ovid Curd, MD       LORazepam (ATIVAN) tablet 2 mg  2 mg Oral TID PRN Tharon Aquas, NP       Or   LORazepam (ATIVAN) injection 2 mg  2 mg Intramuscular TID PRN Tharon Aquas, NP       LORazepam (ATIVAN) tablet 1 mg  1 mg Oral Q6H PRN Massengill, Ovid Curd, MD       LORazepam (ATIVAN) tablet 1 mg  1 mg Oral BID Massengill, Ovid Curd, MD       Followed by   Derrill Memo ON 06/06/2022] LORazepam (ATIVAN) tablet 1 mg  1 mg Oral Daily Massengill, Nathan, MD       losartan (COZAAR) tablet 50 mg  50 mg Oral Daily Lindell Spar I, NP   50 mg at 06/04/22 0826   magnesium hydroxide (MILK OF MAGNESIA) suspension 30 mL  30 mL Oral Daily PRN Tharon Aquas, NP       metFORMIN (GLUCOPHAGE) tablet 500 mg  500 mg Oral BID WC Tharon Aquas, NP   500 mg at 06/04/22 [id number]   multivitamin with minerals tablet 1 tablet  1 tablet Oral Daily Massengill, Ovid Curd, MD   1  tablet at 06/04/22 0826   ondansetron (ZOFRAN-ODT) disintegrating tablet 4 mg  4 mg Oral Q6H PRN Massengill, Ovid Curd, MD       risperiDONE (RISPERDAL M-TABS) disintegrating tablet 1 mg  1 mg Oral BID Tharon Aquas, NP   1 mg at 06/04/22 0830   thiamine (Vitamin B-1) tablet 100 mg  100 mg Oral Daily Massengill, Ovid Curd, MD   100 mg at 06/04/22 0826   traZODone (DESYREL) tablet 50 mg  50 mg Oral QHS PRN Tharon Aquas, NP       Derrill Memo ON 06/07/2022] Vitamin D (Ergocalciferol) (DRISDOL) 1.25 MG (50000 UNIT) capsule 50,000 Units  50,000 Units Oral Q Mon Nwoko, Agnes I, NP       PTA Medications: Medications Prior to Admission  Medication Sig Dispense Refill Last Dose   ACCU-CHEK GUIDE test strip 3 (three) times daily.      gabapentin (NEURONTIN) 300 MG capsule Take 300 mg by mouth 3 (three) times daily. (Patient not taking: Reported on 06/02/2022)      insulin aspart (NOVOLOG) 100 UNIT/ML injection Inject 4 Units into the skin 3 (three) times daily with  meals. (Patient not taking: Reported on 05/24/2020) 10 mL 1    insulin glargine (LANTUS) 100 UNIT/ML injection Inject 0.4 mLs (40 Units total) into the skin every morning. (Patient not taking: Reported on 06/02/2022) 10 mL 1    losartan (COZAAR) 50 MG tablet Take 50 mg by mouth daily. (Patient not taking: Reported on 06/02/2022)      metFORMIN (GLUCOPHAGE) 500 MG tablet Take 1 tablet (500 mg total) by mouth 2 (two) times daily with a meal. (Patient not taking: Reported on 06/02/2022) 60 tablet 0    risperiDONE (RISPERDAL) 2 MG tablet Take 1 tablet (2 mg total) by mouth at bedtime. (Patient not taking: Reported on 06/02/2022) 10 tablet 0    tiZANidine (ZANAFLEX) 4 MG tablet Take 4 mg by mouth at bedtime. Muscle spasm (Patient not taking: Reported on 06/02/2022)      triamcinolone cream (KENALOG) 0.5 % Apply topically 2 (two) times daily as needed. (Patient not taking: Reported on 06/02/2022)      Vitamin D, Ergocalciferol, (DRISDOL) 1.25 MG (50000 UNIT) CAPS capsule Take 50,000 Units by mouth every Monday. (Patient not taking: Reported on 06/02/2022)       Patient Stressors: Health problems   Substance abuse    Patient Strengths: Ability for insight  Active sense of humor   Treatment Modalities: Medication Management, Group therapy, Case management,  1 to 1 session with clinician, Psychoeducation, Recreational therapy.   Physician Treatment Plan for Primary Diagnosis: Alcohol use disorder, moderate, dependence ([place]) Long Term Goal(s): Improvement in symptoms so as ready for discharge   Short Term Goals: Ability to identify and develop effective coping behaviors will improve Ability to maintain clinical measurements within normal limits will improve Compliance with prescribed medications will improve Ability to identify triggers associated with substance abuse/mental health issues will improve Ability to identify changes in lifestyle to reduce recurrence of condition will improve Ability to  verbalize feelings will improve Ability to disclose and discuss suicidal ideas Ability to demonstrate self-control will improve  Medication Management: Evaluate patient's response, side effects, and tolerance of medication regimen.  Therapeutic Interventions: 1 to 1 sessions, Unit Group sessions and Medication administration.  Evaluation of Outcomes: Progressing  Physician Treatment Plan for Secondary Diagnosis: Principal Problem:   Alcohol use disorder, moderate, dependence ([place]) Active Problems:   Paranoid schizophrenia ([place])   Cocaine use disorder,  moderate, dependence (HCC)   Substance induced mood disorder ([place])  Long Term Goal(s): Improvement in symptoms so as ready for discharge   Short Term Goals: Ability to identify and develop effective coping behaviors will improve Ability to maintain clinical measurements within normal limits will improve Compliance with prescribed medications will improve Ability to identify triggers associated with substance abuse/mental health issues will improve Ability to identify changes in lifestyle to reduce recurrence of condition will improve Ability to verbalize feelings will improve Ability to disclose and discuss suicidal ideas Ability to demonstrate self-control will improve     Medication Management: Evaluate patient's response, side effects, and tolerance of medication regimen.  Therapeutic Interventions: 1 to 1 sessions, Unit Group sessions and Medication administration.  Evaluation of Outcomes: Progressing   RN Treatment Plan for Primary Diagnosis: Alcohol use disorder, moderate, dependence ([place]) Long Term Goal(s): Knowledge of disease and therapeutic regimen to maintain health will improve  Short Term Goals: Ability to remain free from injury will improve, Ability to verbalize frustration and anger appropriately will improve, Ability to demonstrate self-control, Ability to participate in decision making will improve, Ability to  verbalize feelings will improve, Ability to disclose and discuss suicidal ideas, Ability to identify and develop effective coping behaviors will improve, and Compliance with prescribed medications will improve  Medication Management: RN will administer medications as ordered by provider, will assess and evaluate patient's response and provide education to patient for prescribed medication. RN will report any adverse and/or side effects to prescribing provider.  Therapeutic Interventions: 1 on 1 counseling sessions, Psychoeducation, Medication administration, Evaluate responses to treatment, Monitor vital signs and CBGs as ordered, Perform/monitor CIWA, COWS, AIMS and Fall Risk screenings as ordered, Perform wound care treatments as ordered.  Evaluation of Outcomes: Progressing   LCSW Treatment Plan for Primary Diagnosis: Alcohol use disorder, moderate, dependence ([place]) Long Term Goal(s): Safe transition to appropriate next level of care at discharge, Engage patient in therapeutic group addressing interpersonal concerns.  Short Term Goals: Engage patient in aftercare planning with referrals and resources, Increase social support, Increase ability to appropriately verbalize feelings, Increase emotional regulation, Facilitate acceptance of mental health diagnosis and concerns, Facilitate patient progression through stages of change regarding substance use diagnoses and concerns, Identify triggers associated with mental health/substance abuse issues, and Increase skills for wellness and recovery  Therapeutic Interventions: Assess for all discharge needs, 1 to 1 time with Social worker, Explore available resources and support systems, Assess for adequacy in community support network, Educate family and significant other(s) on suicide prevention, Complete Psychosocial Assessment, Interpersonal group therapy.  Evaluation of Outcomes: Progressing   Progress in Treatment: Attending groups:  Yes. Participating in groups: Yes. Taking medication as prescribed: Yes. Toleration medication: Yes. Family/Significant other contact made: Yes, individual(s) contacted:  Burlin Godkin [phone] (Mother) Patient understands diagnosis: Yes. Discussing patient identified problems/goals with staff: Yes. Medical problems stabilized or resolved: Yes. Denies suicidal/homicidal ideation: Yes. Issues/concerns per patient self-inventory: Yes. Other:   New problem(s) identified: No, Describe:  none Reported  New Short Term/Long Term Goal(s): medication stabilization, elimination of SI thoughts, development of comprehensive mental wellness plan.   Patient Goals:  Communication Skills  Discharge Plan or Barriers: Patient recently admitted. CSW will continue to follow and assess for appropriate referrals and possible discharge planning.     Reason for Continuation of Hospitalization: Anxiety Delusions  Depression Hallucinations Medication stabilization Suicidal ideation Withdrawal symptoms  Estimated Length of Stay: 3-7 days  Last 3 [place] Suicide Severity Risk Score: Flowsheet Row  Admission (Current) from 06/02/2022 in [place] [facility] Most recent reading at 06/02/2022  3:34 PM ED from 06/02/2022 in [facility] Most recent reading at 06/02/2022 11:12 AM Admission (Discharged) from 04/05/2018 in [place] [facility] Most recent reading at 04/05/2018  4:00 PM  C-SSRS RISK CATEGORY No Risk High Risk High Risk       Last PHQ 2/9 Scores:     No data to display        detox, medication management for mood stabilization; elimination of SI thoughts; development of comprehensive mental wellness/sobriety plan     Scribe for Treatment Team: Windle Guard, LCSW 06/04/2022 1:22 PM

## 2022-06-04 NOTE — Group Note (Signed)
Recreation Therapy Group Note   Group Topic:Problem Solving  Group Date: 06/04/2022 Start Time: 0930 End Time: 1000 Facilitators: Senita Corredor-McCall, LRT,CTRS Location: [address] Dayroom   Goal Area(s) Addresses:  Patient will effectively work in a team with other group members. Patient will verbalize importance of using appropriate problem solving techniques.   Group Description:  Brain Teasers.  LRT gave each patient two copies of brain teasers.  Patients could work together or individually to figure out the answer to each puzzle.  When patients finished, LRT went over the sheets with the patients and used answer key to give the correct answers.     Affect/Mood: Appropriate   Participation Level: Engaged   Participation Quality: Independent   Behavior: Appropriate   Speech/Thought Process: Focused   Insight: Good   Judgement: Good   Modes of Intervention: Worksheet   Patient Response to Interventions:  Engaged   Education Outcome:  Acknowledges education and In group clarification offered    Clinical Observations/Individualized Feedback: Pt was social and worked well with peers.  Pt was bright and engaged with group session.     Plan: Continue to engage patient in RT group sessions 2-3x/week.   Steven Christensen, LRT,CTRS 06/04/2022 12:59 PM

## 2022-06-04 NOTE — Progress Notes (Signed)
Pt A & O to self, place and situation. Denies SI, HI, AVH, paranoia and pain when assessed. Reports he slept well last night with good appetite. Rates his anxiety, depression and anger all 0/10 "I just want to go home, work and leave my [employer]. She wants to be by herself now with her boyfriend, I understand that". Presents animated but fidgety, restless with fair eye contact and unsteady gait at intervals. Observed whispering to self and scanning ceiling during evening medication pass prior to dinner. BP elevated when assessed for CIWA on 3 separate attempts with best reading being BP 183/91 HR 79. PRN Clonidine (1822) and fluids given. Pt encouraged to lie down. He denies headaches, dizziness and blurred vision but gait does appear unsteady "I'm fine, that's how I walk,. I'm not going to fall". Will recheck vitals and alert oncoming shift during report.  Safety checks maintained at Q 15 minutes intervals without issues. All medications administered as ordered with verbal education and effects monitored. Pt tolerates meals, fluids and medications well. Off unit with peers for meals and recreation, returned without issues. Denies concerns at this time.

## 2022-06-04 NOTE — BHH Group Notes (Signed)
Psychoeducational Group- Patients were asked to reflect on warning signs and triggers prior to coming to the hospital, as a way of identifying signs of dysregulation. Pts were then asked ways they could help cope with current stressors. Pt unable to share much content, was largely disorganized and stated he felt tired.

## 2022-06-04 NOTE — [facility] Suicide Risk Assessment (Signed)
Steven Christensen INPATIENT:  Family/Significant Other Suicide Prevention Education  Suicide Prevention Education:  Education Completed; Martino Bajor [phone] (Mother) has been identified by the patient as the family member/significant other with whom the patient will be residing, and identified as the person(s) who will aid the patient in the event of a mental health crisis (suicidal ideations/suicide attempt).  With written consent from the patient, the family member/significant other has been provided the following suicide prevention education, prior to the and/or following the discharge of the patient.  The suicide prevention education provided includes the following: Suicide risk factors Suicide prevention and interventions National Suicide Hotline telephone number [facility] assessment telephone number [facility] Emergency Assistance [place] and/or Residential Mobile Crisis Unit telephone number  Request made of family/significant other to: Remove weapons (e.g., guns, rifles, knives), all items previously/currently identified as safety concern.   Remove drugs/medications (over-the-counter, prescriptions, illicit drugs), all items previously/currently identified as a safety concern.  The family member/significant other verbalizes understanding of the suicide prevention education information provided.  The family member/significant other agrees to remove the items of safety concern listed above.  CSW spoke with Mrs. Sixkiller who confirms that her son does live with her.  She states that she would like her son to go to a treatment center but states that he can return to the home if needed or after leaving a treatment center.  Mrs. Stensrud states that there are no firearms or weapons in her home.  CSW completed SPE with Mrs. Cheron Schaumann.   Frutoso Chase Jaquille Kau 06/04/2022, 1:20 PM

## 2022-06-04 NOTE — Group Note (Signed)
Date:  06/04/2022 Time:  11:06 AM  Group Topic/Focus:  Goals Group:   The focus of this group is to help patients establish daily goals to achieve during treatment and discuss how the patient can incorporate goal setting into their daily lives to aide in recovery. Orientation:   The focus of this group is to educate the patient on the purpose and policies of crisis stabilization and provide a format to answer questions about their admission.  The group details unit policies and expectations of patients while admitted.    Participation Level:  Active  Participation Quality:  Appropriate  Affect:  Appropriate  Cognitive:  Appropriate  Insight: Appropriate  Engagement in Group:  Engaged  Modes of Intervention:  Discussion  Additional Comments:  Patient attended morning orientation/goal group and said that his goal for today is to have positive thoughts.  Deyanna Mctier W Xitlali Kastens [id number], 11:06 AM

## 2022-06-04 NOTE — Progress Notes (Signed)
   06/04/22 0559  Vitals  Temp (!) 97.4 F (36.3 C)  Temp Source Oral  BP (!) 153/79  MAP (mmHg) 99  BP Location Left Arm  BP Method Automatic  Patient Position (if appropriate) Lying  Pulse Rate (!) 57  Pulse Rate Source Monitor  Oxygen Therapy  SpO2 99 %   [place] NP aware, patient vital recheck 131/82, pulse 88, 100%. No acute distress noted, patient denies cardiac symptoms at this time.

## 2022-06-04 NOTE — Progress Notes (Signed)
[facility] MD Progress Note  06/04/2022 3:03 PM Steven Christensen  MRN:  [id number]  Reason for admission: 52 year old AA male with hx of mental health issues & polysubstance use/addictions (alcohol, cocaine, crack cocaine & THC). Admitted to the [facility] this time around from the [facility] under IVC for complaint of worsening suicidal ideations, increased alcohol/cocaine/THC use. Patient apparently reported was having suicidal ideations & wants to kill himself. He was taken to the [facility] by the [employer]. After evaluation at the [facility], Steven Christensen was transferred to the [facility] for further psychiatric evaluation/treatments.   Daily notes: Steven Christensen is seen, chart reviewed. The chart findings discussed with the treatment team. He presents alert, oriented & aware of situation. He is visible on the unit, attending group sessions. He reports, "my mood is good. I had a good night last night, slept well & everything. I have been going to groups & taking my medicines.  I don't have shakes any more. I feel a lot better now. I'm not feeling very depressed or anxious at all. My depression & anxiety today are #2 respectively. The only complaint I have is my hemorrhoids are acting up, It hurts so bad when I try to use the bathroom. I have a cream I use at home. I'm taking the medicines, no side effects". [name] currently denies any SIHI, AVH, delusional thoughts or paranoia. He does not appear to be responding to any internal stimuli. Patient also denies any alcohol withdrawal symptoms. Started on preparation H for active hemorrhoids. Reviewed vital signs, stable. Will continue current plan of care as already in progress.  Principal Problem: Alcohol use disorder, moderate, dependence (HCC)  Diagnosis: Principal Problem:   Alcohol use disorder, moderate, dependence (HCC) Active Problems:   Substance induced mood disorder (HCC)   Paranoid schizophrenia ([place])   Cocaine use disorder, moderate, dependence ([place])  Total Time spent with patient:  35  minutes  Past Psychiatric History: See H&P.  Past Medical History:  Past Medical History:  Diagnosis Date   Anxiety    Bipolar affective ([place])    Depression    Diabetes mellitus without complication ([place])    Schizophrenia ([place])    Sleeping difficulties    No past surgical history on file.  Family History:  Family History  Problem Relation Age of Onset   Diabetes Maternal Aunt    Mental illness Neg Hx    Family Psychiatric  History: See H&P.  Social History:  Social History   Substance and Sexual Activity  Alcohol Use Yes   Comment: Beer and alcohol     Social History   Substance and Sexual Activity  Drug Use Yes   Types: "Crack" cocaine, Marijuana   Comment: Last use of crack was today    Social History   Socioeconomic History   Marital status: Single    Spouse name: Not on file   Number of children: Not on file   Years of education: Not on file   Highest education level: Not on file  Occupational History   Occupation: Disabled  Tobacco Use   Smoking status: Every Day    Packs/day: 1.00    Years: 35.00    Total pack years: 35.00    Types: Cigarettes   Smokeless tobacco: Never  Substance and Sexual Activity   Alcohol use: Yes    Comment: Beer and alcohol   Drug use: Yes    Types: "Crack" cocaine, Marijuana    Comment: Last use of crack was today   Sexual activity:  Yes    Birth control/protection: None  Other Topics Concern   Not on file  Social History Narrative   Pt lives in [place] with mother.  On disability.  Stated that he was previously followed by [facility], but has not been in a while.   Social Determinants of Health   Financial Resource Strain: Not on file  Food Insecurity: Food Insecurity Present (06/02/2022)   Hunger Vital Sign    Worried About Running Out of Food in the Last Year: Sometimes true    Ran Out of Food in the Last Year: Sometimes true  Transportation Needs: Unmet Transportation Needs (06/02/2022)   PRAPARE - Armed forces logistics/support/administrative officer (Medical): Yes    Lack of Transportation (Non-Medical): Yes  Physical Activity: Not on file  Stress: Not on file  Social Connections: Not on file   Additional Social History:   Sleep: Good  Appetite:  Good  Current Medications: Current Facility-Administered Medications  Medication Dose Route Frequency Provider Last Rate Last Admin   acetaminophen (TYLENOL) tablet 650 mg  650 mg Oral Q6H PRN Tharon Aquas, NP       alum & mag hydroxide-simeth (MAALOX/MYLANTA) 200-200-20 MG/5ML suspension 30 mL  30 mL Oral Q4H PRN Tharon Aquas, NP       cloNIDine (CATAPRES) tablet 0.1 mg  0.1 mg Oral Q4H PRN Massengill, Ovid Curd, MD   0.1 mg at 06/04/22 0130   diphenhydrAMINE (BENADRYL) capsule 50 mg  50 mg Oral TID PRN Tharon Aquas, NP       Or   diphenhydrAMINE (BENADRYL) injection 50 mg  50 mg Intramuscular TID PRN Tharon Aquas, NP       gabapentin (NEURONTIN) capsule 300 mg  300 mg Oral TID Lindell Spar I, NP   300 mg at 06/04/22 1159   haloperidol (HALDOL) tablet 5 mg  5 mg Oral TID PRN Tharon Aquas, NP       Or   haloperidol lactate (HALDOL) injection 5 mg  5 mg Intramuscular TID PRN Tharon Aquas, NP       hydrocortisone (ANUSOL-HC) 2.5 % rectal cream   Topical TID Lindell Spar I, NP   1 Application at [id number]   hydrOXYzine (ATARAX) tablet 25 mg  25 mg Oral Q6H PRN Tharon Aquas, NP       insulin aspart (novoLOG) injection 0-15 Units  0-15 Units Subcutaneous TID WC Lindell Spar I, NP   3 Units at 06/04/22 1257   insulin aspart (novoLOG) injection 4 Units  4 Units Subcutaneous TID WC Lindell Spar I, NP   4 Units at 06/04/22 1258   insulin glargine-yfgn (SEMGLEE) injection 40 Units  40 Units Subcutaneous QHS Lindell Spar I, NP   40 Units at 06/03/22 2303   loperamide (IMODIUM) capsule 2-4 mg  2-4 mg Oral PRN Massengill, Ovid Curd, MD       LORazepam (ATIVAN) tablet 2 mg  2 mg Oral TID PRN Tharon Aquas, NP       Or    LORazepam (ATIVAN) injection 2 mg  2 mg Intramuscular TID PRN Tharon Aquas, NP       LORazepam (ATIVAN) tablet 1 mg  1 mg Oral Q6H PRN Massengill, Ovid Curd, MD       LORazepam (ATIVAN) tablet 1 mg  1 mg Oral BID Massengill, Ovid Curd, MD       Followed by   Derrill Memo ON 06/06/2022] LORazepam (ATIVAN) tablet 1 mg  1 mg Oral  Daily Massengill, Ovid Curd, MD       losartan (COZAAR) tablet 50 mg  50 mg Oral Daily Lindell Spar I, NP   50 mg at 06/04/22 [id number]   magnesium hydroxide (MILK OF MAGNESIA) suspension 30 mL  30 mL Oral Daily PRN Tharon Aquas, NP       metFORMIN (GLUCOPHAGE) tablet 500 mg  500 mg Oral BID WC Tharon Aquas, NP   500 mg at 06/04/22 [id number]   multivitamin with minerals tablet 1 tablet  1 tablet Oral Daily Massengill, Ovid Curd, MD   1 tablet at 06/04/22 0826   ondansetron (ZOFRAN-ODT) disintegrating tablet 4 mg  4 mg Oral Q6H PRN Massengill, Ovid Curd, MD       risperiDONE (RISPERDAL M-TABS) disintegrating tablet 1 mg  1 mg Oral BID Tharon Aquas, NP   1 mg at 06/04/22 0830   thiamine (Vitamin B-1) tablet 100 mg  100 mg Oral Daily Massengill, Ovid Curd, MD   100 mg at 06/04/22 [id number]   traZODone (DESYREL) tablet 50 mg  50 mg Oral QHS PRN Tharon Aquas, NP       Derrill Memo ON 06/07/2022] Vitamin D (Ergocalciferol) (DRISDOL) 1.25 MG (50000 UNIT) capsule 50,000 Units  50,000 Units Oral Q Lavetta Nielsen I, NP       Lab Results:  Results for orders placed or performed during the hospital encounter of 06/02/22 (from the past 48 hour(s))  Glucose, capillary     Status: Abnormal   Collection Time: 06/02/22  4:54 PM  Result Value Ref Range   Glucose-Capillary 259 (H) 70 - 99 mg/dL    Comment: Glucose reference range applies only to samples taken after fasting for at least 8 hours.  Glucose, capillary     Status: Abnormal   Collection Time: 06/03/22  7:42 AM  Result Value Ref Range   Glucose-Capillary 263 (H) 70 - 99 mg/dL    Comment: Glucose reference range applies only to samples  taken after fasting for at least 8 hours.  Glucose, capillary     Status: Abnormal   Collection Time: 06/03/22 11:48 AM  Result Value Ref Range   Glucose-Capillary 148 (H) 70 - 99 mg/dL    Comment: Glucose reference range applies only to samples taken after fasting for at least 8 hours.  Glucose, capillary     Status: Abnormal   Collection Time: 06/03/22  5:18 PM  Result Value Ref Range   Glucose-Capillary 170 (H) 70 - 99 mg/dL    Comment: Glucose reference range applies only to samples taken after fasting for at least 8 hours.  Glucose, capillary     Status: Abnormal   Collection Time: 06/03/22  9:34 PM  Result Value Ref Range   Glucose-Capillary 129 (H) 70 - 99 mg/dL    Comment: Glucose reference range applies only to samples taken after fasting for at least 8 hours.  Glucose, capillary     Status: Abnormal   Collection Time: 06/03/22 10:58 PM  Result Value Ref Range   Glucose-Capillary 170 (H) 70 - 99 mg/dL    Comment: Glucose reference range applies only to samples taken after fasting for at least 8 hours.  Glucose, capillary     Status: Abnormal   Collection Time: 06/04/22  1:08 AM  Result Value Ref Range   Glucose-Capillary 176 (H) 70 - 99 mg/dL    Comment: Glucose reference range applies only to samples taken after fasting for at least 8 hours.  Glucose, capillary  Status: Abnormal   Collection Time: 06/04/22  5:55 AM  Result Value Ref Range   Glucose-Capillary 178 (H) 70 - 99 mg/dL    Comment: Glucose reference range applies only to samples taken after fasting for at least 8 hours.  Glucose, capillary     Status: Abnormal   Collection Time: 06/04/22 11:39 AM  Result Value Ref Range   Glucose-Capillary 166 (H) 70 - 99 mg/dL    Comment: Glucose reference range applies only to samples taken after fasting for at least 8 hours.   Blood Alcohol level:  Lab Results  Component Value Date   ETH 104 (H) 06/02/2022   ETH <10 [id number]   Metabolic Disorder Labs: Lab  Results  Component Value Date   HGBA1C 9.6 (H) 06/02/2022   MPG 229 06/02/2022   MPG 254.65 05/23/2020   Lab Results  Component Value Date   PROLACTIN 13.3 04/09/2016   PROLACTIN 74.0 (H) 06/03/2015   Lab Results  Component Value Date   CHOL 138 06/02/2022   TRIG 61 06/02/2022   HDL 69 06/02/2022   CHOLHDL 2.0 06/02/2022   VLDL 12 06/02/2022   LDLCALC 57 06/02/2022   LDLCALC 103 (H) 05/23/2020   Physical Findings: AIMS: Facial and Oral Movements Muscles of Facial Expression: None, normal Lips and Perioral Area: None, normal Jaw: None, normal Tongue: None, normal,Extremity Movements Upper (arms, wrists, hands, fingers): None, normal Lower (legs, knees, ankles, toes): None, normal, Trunk Movements Neck, shoulders, hips: None, normal, Overall Severity Severity of abnormal movements (highest score from questions above): None, normal Incapacitation due to abnormal movements: None, normal Patient's awareness of abnormal movements (rate only patient's report): No Awareness, Dental Status Current problems with teeth and/or dentures?: No Does patient usually wear dentures?: No  CIWA:  CIWA-Ar Total: 1 COWS:     Musculoskeletal: Strength & Muscle Tone: within normal limits Gait & Station: normal Patient leans: N/A  Psychiatric Specialty Exam:  Presentation  General Appearance:  Casual; Fairly Groomed  Eye Contact: Good  Speech: Clear and Coherent; Normal Rate  Speech Volume: Normal  Handedness: Right  Mood and Affect  Mood: Anxious; Depressed  Affect: Flat; Depressed; Congruent  Thought Process  Thought Processes: Coherent; Goal Directed; Linear  Descriptions of Associations:Intact  Orientation:Full (Time, Place and Person)  Thought Content:Logical  History of Schizophrenia/Schizoaffective disorder:No data recorded Duration of Psychotic Symptoms:Greater than six months  Hallucinations:Hallucinations: None Description of Auditory Hallucinations:  NA  Ideas of Reference:None  Suicidal Thoughts:Suicidal Thoughts: No SI Active Intent and/or Plan: Without Intent; Without Plan; Without Means to Carry Out; Without Access to Means  Homicidal Thoughts:Homicidal Thoughts: No  Sensorium  Memory: Recent Good; Immediate Good; Remote Fair  Judgment: Fair  Insight: Fair  Community education officer  Concentration: Fair  Attention Span: Fair  Recall: [place] of Knowledge: Fair  Language: Good  Psychomotor Activity  Psychomotor Activity: Psychomotor Activity: Normal  Assets  Assets: Communication Skills; Desire for Improvement; Financial Resources/Insurance; Social Support; Housing  Sleep  Sleep: Sleep: Good Number of Hours of Sleep: 7.5  Physical Exam: Physical Exam Vitals and nursing note reviewed.  HENT:     Nose: Nose normal.     Mouth/Throat:     Pharynx: Oropharynx is clear.  Eyes:     Pupils: Pupils are equal, round, and reactive to light.  Cardiovascular:     Rate and Rhythm: Normal rate.     Pulses: Normal pulses.  Pulmonary:     Effort: Pulmonary effort is normal.  Genitourinary:  Comments: Deferred Musculoskeletal:        General: Normal range of motion.     Cervical back: Normal range of motion.  Skin:    General: Skin is warm and dry.  Neurological:     General: No focal deficit present.     Mental Status: He is alert and oriented to person, place, and time.   Review of Systems  Constitutional:  Negative for chills, diaphoresis and fever.  HENT:  Negative for congestion and sore throat.   Eyes:  Negative for blurred vision.  Respiratory:  Negative for cough, shortness of breath and wheezing.   Cardiovascular:  Negative for chest pain and palpitations.  Gastrointestinal:  Negative for abdominal pain, constipation, diarrhea, heartburn, nausea and vomiting.       C/o hemorrhoidal pain. Preparation H cream ordered.  Genitourinary:  Negative for dysuria.  Musculoskeletal:  Negative for  joint pain and myalgias.  Skin:  Negative for itching and rash.  Neurological:  Negative for dizziness, tingling, tremors, sensory change, speech change, focal weakness, seizures, loss of consciousness, weakness and headaches.  Endo/Heme/Allergies:        NKDA  Psychiatric/Behavioral:  Positive for depression and substance abuse (Hx. alcohol/cocaine use disorders.). Negative for hallucinations, memory loss and suicidal ideas. The patient is not nervous/anxious and does not have insomnia.    Blood pressure 124/77, pulse 79, temperature (!) 97.4 F (36.3 C), temperature source Oral, resp. rate 20, height '6\' 3"'$  (1.905 m), weight 89.2 kg, SpO2 100 %. Body mass index is 24.57 kg/m.  Treatment Plan Summary: Daily contact with patient to assess and evaluate symptoms and progress in treatment and Medication management.   Continue inpatient hospitalization.  Will continue today 06/04/2022 plan as below except where it is noted.   Principal/active diagnoses. Alcohol use disorder, moderate, dependence ([place]). Bipolar disorder. Paranoid schizophrenia ([place]) Cocaine use disorder, moderate, dependence ([place]).    Discussed this case with the attending psychiatrist. See the treatment plan below.  Plan:  -Continue the CIWA detox protocols for alcohol withdrawal management.  -Continue Risperdal M-tabs 1 mg po bid for mood control.  -Continue hydroxyzine 25 mg po tid prn for anxiety. -Continue gabapentin 300 mg po tid for agitation.  -Continue Trazodone 50 mg po Q hs prn for insomnia.   Agitation protocols.  -Continue haldol 5 mg po/IM tid prn.  -Continue Benadryl 50 mg po/IM tid prn.  -Continue Ativan 2 mg po/IM tid prn.   Other medical issues.  -Continue Lantus insulin 40 units SubQ at bedtime for DM.  -Continue Cozaar 50 mg po daily for HTN.  -Continue Metformin 500 mg po bid for DM.  -Continue Vit. D 50,000 units po Q Mondays for bone health.  -Continue Novolog insulin 4 units SubQ  tid qith  meals. -Initiated preparation H, apply to affected area tid for hemorrhoidal pain.   Other PRNS -Continue Tylenol 650 mg every 6 hours PRN for mild pain -Continue Maalox 30 ml Q 4 hrs PRN for indigestion -Continue MOM 30 ml po Q 6 hrs for constipation.  -Continue clonidine 0.1 mg Q 4 hrs prn for for SBP over 180 or DBP over100.   Safety and Monitoring: Voluntary admission to inpatient psychiatric unit for safety, stabilization and treatment Daily contact with patient to assess and evaluate symptoms and progress in treatment Patient's case to be discussed in multi-disciplinary team meeting Observation Level : q15 minute checks Vital signs: q12 hours Precautions: Safety   Discharge Planning: Social work and case management to assist  with discharge planning and identification of hospital follow-up needs prior to discharge Estimated LOS: 5-7 days Discharge Concerns: Need to establish a safety plan; Medication compliance and effectiveness Discharge Goals: Return home with outpatient referrals for mental health follow-up including medication management/psychotherapy  Lindell Spar, NP, pmhnp, fnp-bc. 06/04/2022, 3:03 PM

## 2022-06-04 NOTE — Progress Notes (Signed)
Patient to med window unsteady on feet. Patient seated and provided with peanut butter crackers, cheese and apple juice. Patient denies dizziness and pain at this time, no acute distress noted. Patient states he is "just sleepy" from waking up. Recheck CBG of 170 mg/dL and BP 165/94, pulse 65, spo2 99%. Education provided on fall safety. NP notified, patient walked back to room with MHT.

## 2022-06-04 NOTE — Progress Notes (Addendum)
   06/03/22 2120  Vitals  BP (!) 171/90  Pulse Rate 85  Pain Assessment  Pain Scale 0-10  Pain Score 0   Patient BP elevated. Patient denies cardiac symptoms at this time, no acute distress noted. Patient patient BP recheck 151/86, 168/94 and 170/94. Patient reports "feeling faint in bed when changing positions and feeling faint for a long time since before coming here". Patient CIWA= 2. Patient educated on fall prevention and safety. High fall risk maintained. NP notified.
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
## 2022-06-05 LAB — GLUCOSE, CAPILLARY
Glucose-Capillary: 231 mg/dL — ABNORMAL HIGH (ref 70–99)
Glucose-Capillary: 228 mg/dL — ABNORMAL HIGH (ref 70–99)
Glucose-Capillary: 332 mg/dL — ABNORMAL HIGH (ref 70–99)
Glucose-Capillary: 181 mg/dL — ABNORMAL HIGH (ref 70–99)
Glucose-Capillary: 232 mg/dL — ABNORMAL HIGH (ref 70–99)

## 2022-06-05 MED ORDER — CLONIDINE HCL 0.1 MG PO TABS
0.1000 mg | ORAL_TABLET | Freq: Once | ORAL | Status: AC
  Administered 2022-06-05: 0.1 mg via ORAL
  Filled 2022-06-05 (×2): qty 1

## 2022-06-05 MED ORDER — CLONIDINE HCL 0.1 MG PO TABS
0.1000 mg | ORAL_TABLET | ORAL | Status: DC | PRN
  Administered 2022-06-05: 0.1 mg via ORAL
  Filled 2022-06-05: qty 1
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
## 2022-06-05 NOTE — Group Note (Signed)
LCSW Group Therapy Note   Group Date: 06/05/2022 Start Time: 1000 End Time: 1100    Type of Therapy and Topic: Group Therapy: Relationship Check-Ins   Participation Level: Did Not Attend   Description Group:  In this group patients are encouraged to rate how well or not well they are able to improve their relationships in Beliefs and Values, Communication, Family and Friends, Publishing rights manager and Household, and Intimacy. Patients will be able to discuss and identify what is going well within these aspects and what is not. Patients will be able to find appropriate ways, solutions, and skills that will help them within the selected relationships category. Patients will be encouraged to share and reflect on why things within their relationships are not going so well and get feedback from the instructor or their peers.  This group will be solution focused and process-oriented with patients' participation in sharing and listening to their own and peers experience; along with receive support and advice on how to improve or change the circumstance that is known to be challenging in their relationships category (Beliefs and Values, Communication, Family and Friends, Publishing rights manager and Household, and Intimacy).   Therapeutic Goals:  Patient will identify their strengths and weakness within their Beliefs and Values, Communication, Family and Friends, Publishing rights manager and Household, and Intimacy relationships. Patient will identify reasons why and how they can improve their relationships.  Patient will identify how they can be supportive and honest to themselves and in their relationships.  Patient will be able to gain support and give support to others with similar challenges.   Summary of Patient Progress: Did Not Attend     Therapeutic Modalities:  Solution Focused Therapy Cognitive Behavioral Therapy  Psychodynamic Therapy  Dialectical Behavior Therapy   Silas Flood , [place]   06/05/2022  3:40 PM

## 2022-06-05 NOTE — [facility] Group Notes (Signed)
[facility] Group Notes:  (Nursing/MHT/Case Management/Adjunct)  Date:  06/05/2022  Time:  9:27 PM  Type of Therapy:   wrap up group  Participation Level:  Active  Participation Quality:  Appropriate  Affect:  Appropriate  Cognitive:  Appropriate  Insight:  Appropriate  Engagement in Group:  Engaged  Modes of Intervention:  Education  Summary of Progress/Problems:  Pt goal to work on d/c planning. Day was 8/10.  Orvan Falconer 06/05/2022, 9:27 PM

## 2022-06-05 NOTE — Progress Notes (Signed)
Patient has been isolative to room during most of the shift. Patient has poor boundaries and is sexually inappropriate when interacting with male staff. Patient has been compliant with medications and has had no incidents of behavioral dyscontrol this shift.   Assess patient for safety, offer medications as prescribed, engage patient in 1:1 staff talks.   Patient able to contract for safety. Continue to monitor as planned.
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
## 2022-06-05 NOTE — Progress Notes (Addendum)
   06/04/22 1945  Psych Admission Type (Psych Patients Only)  Admission Status Involuntary  Psychosocial Assessment  Patient Complaints Substance abuse;Disorientation;Suspiciousness  Eye Contact Fair  Facial Expression Animated;Anxious  Affect Anxious  Speech Slow;Slurred  Interaction Assertive  Motor Activity Slow;Unsteady  Appearance/Hygiene In hospital gown  Behavior Characteristics Irritable;Resistant to care  Mood Preoccupied;Irritable;Suspicious  Thought Process  Coherency Disorganized  Content Blaming others  Delusions None reported or observed  Perception Hallucinations  Hallucination Auditory  Judgment Impaired  Confusion Moderate  Danger to Self  Current suicidal ideation? Denies  Agreement Not to Harm Self Yes  Description of Agreement Verbally contracts for safety.  Danger to Others  Danger to Others None reported or observed  Danger to Others Abnormal  Harmful Behavior to others No threats or harm toward other people  Destructive Behavior No threats or harm toward property   Patient presenting with an anxious affect and preoccupied, irritable mood. Patient unsteady on feet and disoriented, patient asked staff member where he was. Patient became irritable when escorted back to his room after continuing to get up while unsteady on feet. Patient became upset with staff and stated "I'm not answering none of those questions, get the f**k out of my room" when staff attempted to further assess patient. Patient denied SI, HI, AVH, and pain. Patient appeared to be responding to internal stimuli. BP 156/86 patient refused vital retake. NP notified of patient status. Routine safety checks conducted every 15 minutes. Patient verbally contracted for safety and remains safe on the unit.

## 2022-06-05 NOTE — Progress Notes (Signed)
Pt BP remains elevated post Clonidine 0.2 mg this shift. Fluids given. Denies active withdrawals symptoms, none evident such as sweat, tremors, confusion at this time. Pt is verbally redirectable, A & O to self, place and situation at this time. Pt continues to require multiple verbal redirections for sexually inappropriate comments towards male peers on 300 hall and staff as well. Oncoming RN made aware. Safety checks maintained at Q 15 minutes intervals.

## 2022-06-05 NOTE — Progress Notes (Signed)
Patient presenting with unsteady gait, disorientation, agitation and responding to internal stimuli on assessment. After several attempts of redirecting patient back to his room due to unsteady gait, patient would not allow RNs to further assess him and slammed the door and stated "Im not answering none of those questions, get the [facility] out of my room." Patient moved to 500 hall per [facility]. NP notified of patient status.

## 2022-06-06 LAB — GLUCOSE, CAPILLARY
Glucose-Capillary: 186 mg/dL — ABNORMAL HIGH (ref 70–99)
Glucose-Capillary: 106 mg/dL — ABNORMAL HIGH (ref 70–99)

## 2022-06-06 MED ORDER — RISPERIDONE 2 MG PO TABS: 2.0000 mg | ORAL_TABLET | Freq: Every day | ORAL | 0 refills | Status: AC

## 2022-06-06 MED ORDER — TRAZODONE HCL 50 MG PO TABS: 50.0000 mg | ORAL_TABLET | Freq: Every evening | ORAL | 0 refills | Status: AC | PRN

## 2022-06-06 NOTE — Progress Notes (Signed)
Patient discharged from [facility] on 06/06/2022 at 1215. Patient denies SI, plan, and intention. Suicide safety plan completed, reviewed with this RN, given to the patient, and a copy in the chart. Patient denies HI/AVH upon discharge.  Patient is alert, oriented, and cooperative. RN provided patient with discharge paperwork and reviewed information with patient. Patient expressed that he understood all of the discharge instructions. Pt was satisfied with belongings returned to him from the locker and at bedside. Discharged patient to [facility] waiting room. Pt's taxi driver awaiting patient in the [facility] waiting room.

## 2022-06-06 NOTE — Progress Notes (Signed)
   06/06/22 0555  15 Minute Checks  Location Bedroom  Visual Appearance Calm  Behavior Sleeping  Sleep (Behavioral Health Patients Only)  Calculate sleep? (Click Yes once per 24 hr at 0600 safety check) Yes  Documented sleep last 24 hours 10.25

## 2022-06-06 NOTE — Discharge Summary (Signed)
Physician Discharge Summary Note  Patient:  Steven Christensen is an 52 y.o., male MRN:  [id number] DOB:  1970-05-05 Patient phone:  [phone] (home)  Patient address:   [place] [address],  Total Time spent with patient: 30 minutes  Date of Admission:  06/02/2022 Date of Discharge: 06/06/22  Reason for Admission:   HPI: Steven Christensen is a 52 year old AA male with past psychiatric history of mental health issues & polysubstance use/addictions (alcohol, cocaine, crack cocaine & THC). Who initially presented to [facility] under IVC via [facility] for complaint of worsening suicidal ideations, increased alcohol/cocaine/THC use. Patient was evaluated and recommended for inpatient psychiatric services where he was transferred to the [facility] for further stabilization and treatment.    24 hour chart review: Patient was transferred to back to [address] hall on last evening.  Staff report patient has required multiple redirections for sexually inappropriate comments towards females peers and staff; state he is redirectable. BP remains slightly elevated; most recent 153/91 '@0630'$ . Last CIWA score 0800 was 1. He remains on Risperidone 1 mg BID for schizophrenia; PRN Clonidine 0.1 mg for elevated bp given. Medication compliant. Sleeping throughout the night without any issues. He is requesting discharge.   Assessment: Patient assessed in his room where he presents alert and oriented, calm and cooperative. Patient reports feeling 'good'. He reports adequate sleep and improved appetite. He expresses wanting discharge says he needs to 'get bills sorted out'; currently lives with mother in [place]. CIWA scores have been 1 with no required PRNs for withdrawals. He has remained alert and oriented, calm and cooperative with some sexual inappropriate behaviors reported by staff but has responded appropriately to redirection. No paranoia or delusional thought content noted. He denies any SI/HI/AVH. Endorses  ongoing addiction that he 'knows' contributed to his presentation on the day of admission. Declines any interest in substance abuse treatment at this time. Care discussed with team who agree patient is appropriate for discharge. IVC rescinded by attending MD Steven Christensen. Patient called mom who is unable to transport at this time. Social work consulted, patient to receive cab voucher. Prescription for Risperidone printed and given to patient; he reports having adequate supply and refills for other medications.   Principal Problem: Alcohol use disorder, moderate, dependence ([place]) Discharge Diagnoses: Principal Problem:   Alcohol use disorder, moderate, dependence (HCC) Active Problems:   Paranoid schizophrenia ([place])   Substance induced mood disorder (HCC)   Cocaine use disorder, moderate, dependence ([place])  Past Psychiatric History: see H&P  Past Medical History:  Past Medical History:  Diagnosis Date   Anxiety    Bipolar affective ([place])    Depression    Diabetes mellitus without complication ([place])    Schizophrenia ([place])    Sleeping difficulties    No past surgical history on file. Family History:  Family History  Problem Relation Age of Onset   Diabetes Maternal Aunt    Mental illness Neg Hx    Family Psychiatric History: see H&P Social History:  Social History   Substance and Sexual Activity  Alcohol Use Yes   Comment: Beer and alcohol     Social History   Substance and Sexual Activity  Drug Use Yes   Types: "Crack" cocaine, Marijuana   Comment: Last use of crack was today    Social History   Socioeconomic History   Marital status: Single    Spouse name: Not on file   Number of children: Not on file   Years of education:  Not on file   Highest education level: Not on file  Occupational History   Occupation: Disabled  Tobacco Use   Smoking status: Every Day    Packs/day: 1.00    Years: 35.00    Total pack years: 35.00    Types: Cigarettes   Smokeless tobacco: Never   Substance and Sexual Activity   Alcohol use: Yes    Comment: Beer and alcohol   Drug use: Yes    Types: "Crack" cocaine, Marijuana    Comment: Last use of crack was today   Sexual activity: Yes    Birth control/protection: None  Other Topics Concern   Not on file  Social History Narrative   Pt lives in [place] with mother.  On disability.  Stated that he was previously followed by [facility], but has not been in a while.   Social Determinants of Health   Financial Resource Strain: Not on file  Food Insecurity: Food Insecurity Present (06/02/2022)   Hunger Vital Sign    Worried About Running Out of Food in the Last Year: Sometimes true    Ran Out of Food in the Last Year: Sometimes true  Transportation Needs: Unmet Transportation Needs (06/02/2022)   PRAPARE - Hydrologist (Medical): Yes    Lack of Transportation (Non-Medical): Yes  Physical Activity: Not on file  Stress: Not on file  Social Connections: Not on file    Hospital Course:   Steven Christensen was admitted for Alcohol use disorder, moderate, dependence ([place]) and crisis management.  He was treated with the following medications Risperidone.  Steven Christensen was discharged with current medication and was instructed on how to take medications as prescribed; (details listed below under Medication List).  Medical problems were identified and treated as needed.  Home medications were restarted as appropriate.   Improvement was monitored by observation and Steven Christensen daily report of symptom reduction.  Emotional and mental status was monitored by daily self-inventory reports completed by Steven Christensen and clinical staff.         Steven Christensen was evaluated by the treatment team for stability and plans for continued recovery upon discharge.  Steven Christensen motivation was an integral factor for scheduling further treatment.  Employment, transportation, bed availability, health status, family support, and  any pending legal issues were also considered during his hospital stay.  He was offered further treatment options upon discharge including but not limited to Residential, Intensive Outpatient, and Outpatient treatment.  Josmar Sawin will follow up with the services as listed below under Follow Up Information.      Upon completion of this admission the Steven Christensen was both mentally and medically stable for discharge denying suicidal/homicidal ideation, auditory/visual/tactile hallucinations, delusional thoughts and paranoia.  Physical Findings: AIMS: Facial and Oral Movements Muscles of Facial Expression: None, normal Lips and Perioral Area: None, normal Jaw: None, normal Tongue: None, normal,Extremity Movements Upper (arms, wrists, hands, fingers): None, normal Lower (legs, knees, ankles, toes): None, normal, Trunk Movements Neck, shoulders, hips: None, normal, Overall Severity Severity of abnormal movements (highest score from questions above): None, normal Incapacitation due to abnormal movements: None, normal Patient's awareness of abnormal movements (rate only patient's report): No Awareness, Dental Status Current problems with teeth and/or dentures?: No Does patient usually wear dentures?: No  CIWA:  CIWA-Ar Total: 1 COWS:     Musculoskeletal: Strength & Muscle Tone: within normal limits Gait & Station: normal Patient leans: N/A   Psychiatric Specialty Exam:  Presentation  General Appearance:  Casual; Disheveled  Eye Contact: Good  Speech: Clear and Coherent; Normal Rate  Speech Volume: Normal  Handedness: Right   Mood and Affect  Mood: Depressed  Affect: Congruent; Flat   Thought Process  Thought Processes: Coherent; Linear  Descriptions of Associations:Intact  Orientation:Full (Time, Place and Person)  Thought Content:Logical  History of Schizophrenia/Schizoaffective disorder:No data recorded Duration of Psychotic Symptoms:Greater than six  months  Hallucinations:Hallucinations: None  Ideas of Reference:None  Suicidal Thoughts:Suicidal Thoughts: No  Homicidal Thoughts:Homicidal Thoughts: No   Sensorium  Memory: Immediate Fair; Recent Fair  Judgment: Fair  Insight: Fair   Community education officer  Concentration: Fair  Attention Span: Fair  Recall: [employer] of Knowledge: Fair  Language: Fair   Psychomotor Activity  Psychomotor Activity: Psychomotor Activity: Normal   Assets  Assets: Communication Skills; Desire for Improvement; Physical Health; Resilience   Sleep  Sleep: Sleep: Good    Physical Exam: Physical Exam Vitals and nursing note reviewed.  Constitutional:      Appearance: He is normal weight. He is not ill-appearing.  HENT:     Head: Normocephalic.     Nose: Nose normal.     Mouth/Throat:     Mouth: Mucous membranes are moist.     Pharynx: Oropharynx is clear.  Eyes:     Pupils: Pupils are equal, round, and reactive to light.  Cardiovascular:     Rate and Rhythm: Normal rate.     Pulses: Normal pulses.  Pulmonary:     Effort: Pulmonary effort is normal.  Abdominal:     Palpations: Abdomen is soft.  Musculoskeletal:        General: Normal range of motion.     Cervical back: Normal range of motion.  Skin:    General: Skin is dry.  Neurological:     Mental Status: He is alert and oriented to person, place, and time.  Psychiatric:        Attention and Perception: Attention and perception normal.        Mood and Affect: Mood and affect normal.        Speech: Speech normal.        Behavior: Behavior normal. Behavior is cooperative.        Thought Content: Thought content is not paranoid or delusional. Thought content does not include homicidal or suicidal ideation. Thought content does not include homicidal or suicidal plan.        Cognition and Memory: Cognition and memory normal.        Judgment: Judgment is impulsive.    Review of Systems   Psychiatric/Behavioral:  Positive for substance abuse. Negative for depression, hallucinations and suicidal ideas. The patient is not nervous/anxious and does not have insomnia.   All other systems reviewed and are negative.  Blood pressure (!) 153/91, pulse 82, temperature (!) 97.4 F (36.3 C), temperature source Oral, resp. rate 18, height '6\' 3"'$  (1.905 m), weight 89.2 kg, SpO2 100 %. Body mass index is 24.57 kg/m.   Social History   Tobacco Use  Smoking Status Every Day   Packs/day: 1.00   Years: 35.00   Total pack years: 35.00   Types: Cigarettes  Smokeless Tobacco Never   Tobacco Cessation:  Prescription not provided because: pt declined   Blood Alcohol level:  Lab Results  Component Value Date   ETH 104 (H) 06/02/2022   ETH <10 [id number]    Metabolic Disorder Labs:  Lab Results  Component Value Date   HGBA1C 9.6 (  H) 06/02/2022   MPG 229 06/02/2022   MPG 254.65 05/23/2020   Lab Results  Component Value Date   PROLACTIN 13.3 04/09/2016   PROLACTIN 74.0 (H) 06/03/2015   Lab Results  Component Value Date   CHOL 138 06/02/2022   TRIG 61 06/02/2022   HDL 69 06/02/2022   CHOLHDL 2.0 06/02/2022   VLDL 12 06/02/2022   LDLCALC 57 06/02/2022   LDLCALC 103 (H) 05/23/2020    See Psychiatric Specialty Exam and Suicide Risk Assessment completed by Attending Physician prior to discharge.  Discharge destination:  Home  Is patient on multiple antipsychotic therapies at discharge:  No   Has Patient had three or more failed trials of antipsychotic monotherapy by history:  No  Recommended Plan for Multiple Antipsychotic Therapies: NA   Allergies as of 06/06/2022   No Known Allergies      Medication List     TAKE these medications      Indication  Accu-Chek Guide test strip Generic drug: glucose blood 3 (three) times daily.  Indication: Diabetes   gabapentin 300 MG capsule Commonly known as: NEURONTIN Take 300 mg by mouth 3 (three) times daily.   Indication: Diabetes with Nerve Disease   insulin aspart 100 UNIT/ML injection Commonly known as: novoLOG Inject 4 Units into the skin 3 (three) times daily with meals.  Indication: Type 2 Diabetes   insulin glargine 100 UNIT/ML injection Commonly known as: LANTUS Inject 0.4 mLs (40 Units total) into the skin every morning.  Indication: Type 2 Diabetes   losartan 50 MG tablet Commonly known as: COZAAR Take 50 mg by mouth daily.  Indication: High Blood Pressure Disorder   metFORMIN 500 MG tablet Commonly known as: GLUCOPHAGE Take 1 tablet (500 mg total) by mouth 2 (two) times daily with a meal.  Indication: Type 2 Diabetes   risperiDONE 2 MG tablet Commonly known as: RISPERDAL Take 1 tablet (2 mg total) by mouth daily. What changed: when to take this  Indication: Manic-Depression, Schizophrenia   tiZANidine 4 MG tablet Commonly known as: ZANAFLEX Take 4 mg by mouth at bedtime. Muscle spasm  Indication: Muscle Spasticity   traZODone 50 MG tablet Commonly known as: DESYREL Take 1 tablet (50 mg total) by mouth at bedtime as needed for sleep.  Indication: Trouble Sleeping   triamcinolone cream 0.5 % Commonly known as: KENALOG Apply topically 2 (two) times daily as needed.  Indication: Skin Inflammation   Vitamin D (Ergocalciferol) 1.25 MG (50000 UNIT) Caps capsule Commonly known as: DRISDOL Take 50,000 Units by mouth every Monday.  Indication: Vitamin D Deficiency        Follow-up Information     Steven Christensen. Go to.   Specialty: Behavioral Health Why: Please go to this provider for an assessment, to obtain therapy and medication management services, on Monday through Friday, arrive by 7:20 am.  Assessments are provided on a first come, first served basis. Contact information: [place] [phone]        Services, [facility] Follow up.   Why: Referral made Contact information: [place] [address] [phone]                Follow-up recommendations:   Activity: As tolerated   Diet: Heart healthy   Other: -Follow-up with your outpatient psychiatric provider -instructions on appointment date, time, and address (location) are provided to you in discharge paperwork.   -Take your psychiatric medications as  prescribed at discharge - instructions are provided to you in the discharge paperwork.    -Follow-up with outpatient primary care doctor and other specialists -for management of preventative medicine and chronic medical disease, including:   -Testing: Follow-up with outpatient provider for abnormal lab results:  none   -Recommend abstinence from alcohol, tobacco, and other illicit drug use at discharge.    -If your psychiatric symptoms recur, worsen, or if you have side effects to your psychiatric medications, call your outpatient psychiatric provider, 911, [phone] or go to the nearest emergency department.   -If suicidal thoughts recur, call your outpatient psychiatric provider, 911, [phone] or go to the nearest emergency department.  Comments:    Signed: Inda Merlin, NP 06/06/2022, 10:22 AM

## 2022-06-06 NOTE — BHH Suicide Risk Assessment (Addendum)
Suicide Risk Assessment  Discharge Assessment    [facility] Discharge Suicide Risk Assessment   Principal Problem: Alcohol use disorder, moderate, dependence ([place]) Discharge Diagnoses: Principal Problem:   Alcohol use disorder, moderate, dependence ([place]) Active Problems:   Paranoid schizophrenia ([place])   Substance induced mood disorder ([place])   Cocaine use disorder, moderate, dependence ([place])  Steven Christensen was admitted for Alcohol use disorder, moderate, dependence ([place]) and crisis management.  He was treated with the following medications Risperidone.  Steven Christensen was discharged with current medication and was instructed on how to take medications as prescribed; (details listed below under Medication List).  Medical problems were identified and treated as needed.  Home medications were restarted as appropriate.  Improvement was monitored by observation and Steven Christensen daily report of symptom reduction.  Emotional and mental status was monitored by daily self-inventory reports completed by Steven Christensen and clinical staff.         Steven Christensen was evaluated by the treatment team for stability and plans for continued recovery upon discharge.  Steven Christensen motivation was an integral factor for scheduling further treatment.  Employment, transportation, bed availability, health status, family support, and any pending legal issues were also considered during his hospital stay.  He was offered further treatment options upon discharge including but not limited to Residential, Intensive Outpatient, and Outpatient treatment.  Steven Christensen will follow up with the services as listed below under Follow Up Information.     Upon completion of this admission the Steven Christensen was both mentally and medically stable for discharge denying suicidal/homicidal ideation, auditory/visual/tactile hallucinations, delusional thoughts and paranoia.     Total Time spent with patient: 30  minutes  Musculoskeletal: Strength & Muscle Tone: within normal limits Gait & Station: normal Patient leans: N/A  Psychiatric Specialty Exam  Presentation  General Appearance:  Casual; Disheveled  Eye Contact: Good  Speech: Clear and Coherent; Normal Rate  Speech Volume: Normal  Handedness: Right   Mood and Affect  Mood: Depressed  Duration of Depression Symptoms: Greater than two weeks  Affect: Congruent; Flat   Thought Process  Thought Processes: Coherent; Linear  Descriptions of Associations:Intact  Orientation:Full (Time, Place and Person)  Thought Content:Logical  History of Schizophrenia/Schizoaffective disorder:No data recorded Duration of Psychotic Symptoms:Greater than six months  Hallucinations:Hallucinations: None  Ideas of Reference:None  Suicidal Thoughts:Suicidal Thoughts: No  Homicidal Thoughts:Homicidal Thoughts: No   Sensorium  Memory: Immediate Fair; Recent Fair  Judgment: Fair  Insight: Fair   Community education officer  Concentration: Fair  Attention Span: Fair  Recall: [employer] of Knowledge: Fair  Language: Fair   Psychomotor Activity  Psychomotor Activity: Psychomotor Activity: Normal   Assets  Assets: Communication Skills; Desire for Improvement; Physical Health; Resilience   Sleep  Sleep: Sleep: Good   Physical Exam: Physical Exam Vitals and nursing note reviewed.  Constitutional:      Appearance: He is normal weight.  HENT:     Head: Normocephalic.     Nose: Nose normal.     Mouth/Throat:     Mouth: Mucous membranes are moist.     Pharynx: Oropharynx is clear.  Eyes:     Pupils: Pupils are equal, round, and reactive to light.  Pulmonary:     Effort: Pulmonary effort is normal.  Abdominal:     Palpations: Abdomen is soft.  Musculoskeletal:        General: Normal range of motion.     Cervical back: Normal range of motion.  Skin:  General: Skin is warm and dry.  Neurological:      Mental Status: He is alert and oriented to person, place, and time.  Psychiatric:        Attention and Perception: Attention and perception normal.        Mood and Affect: Mood and affect normal.        Speech: Speech normal.        Behavior: Behavior normal. Behavior is cooperative.        Thought Content: Thought content normal.        Cognition and Memory: Cognition and memory normal.        Judgment: Judgment is impulsive.    ROS Blood pressure (!) 153/91, pulse 82, temperature (!) 97.4 F (36.3 C), temperature source Oral, resp. rate 18, height '6\' 3"'$  (1.905 m), weight 89.2 kg, SpO2 100 %. Body mass index is 24.57 kg/m.  Mental Status Per Nursing Assessment::   On Admission:  NA  Demographic Factors:  Male and Low socioeconomic status  Loss Factors: NA  Historical Factors: Impulsivity  Risk Reduction Factors:   Living with another person, especially a relative and Positive social support  Continued Clinical Symptoms:  Alcohol/Substance Abuse/Dependencies Schizophrenia:   Paranoid or undifferentiated type More than one psychiatric diagnosis  Cognitive Features That Contribute To Risk:  None    Suicide Risk:  Moderate:  Frequent suicidal ideation with limited intensity, and duration, some specificity in terms of plans, no associated intent, good self-control, limited dysphoria/symptomatology, some risk factors present, and identifiable protective factors, including available and accessible social support.   [place]. Go to.   Specialty: Behavioral Health Why: Please go to this provider for an assessment, to obtain therapy and medication management services, on Monday through Friday, arrive by 7:20 am.  Assessments are provided on a first come, first served basis. Contact information: [place] [phone]        Services, [facility] Follow up.   Why:  Referral made Contact information: [address] [place] [address] [phone]                 Plan Of Care/Follow-up recommendations:  Activity: As tolerated  Diet: Heart healthy  Other: -Follow-up with your outpatient psychiatric provider -instructions on appointment date, time, and address (location) are provided to you in discharge paperwork.   -Take your psychiatric medications as prescribed at discharge - instructions are provided to you in the discharge paperwork.    -Follow-up with outpatient primary care doctor and other specialists -for management of preventative medicine and chronic medical disease, including:   -Testing: Follow-up with outpatient provider for abnormal lab results:  none   -Recommend abstinence from alcohol, tobacco, and other illicit drug use at discharge.    -If your psychiatric symptoms recur, worsen, or if you have side effects to your psychiatric medications, call your outpatient psychiatric provider, 911, [phone] or go to the nearest emergency department.   -If suicidal thoughts recur, call your outpatient psychiatric provider, 911, [phone] or go to the nearest emergency department.  Inda Merlin, NP 06/06/2022, 10:17 AM

## 2022-06-06 NOTE — Progress Notes (Signed)
  [facility] Adult Case Management Discharge Plan :  Will you be returning to the same living situation after discharge:  Yes,  Patient to return to mother's residence. At discharge, do you have transportation home?: Yes,  Patient's mother to provide transportation from hospital  Do you have the ability to pay for your medications: No. Patient has no listed insurance, to be provided with 7 day supply of medication at discharge. CSW has provided patient with clinic information for free/reduced medications.   Release of information consent forms completed and in the chart;  Patient's signature needed at discharge.  Patient to Follow up at:  [facility]. Go to.   Specialty: Behavioral Health Why: Please go to this provider for an assessment, to obtain therapy and medication management services, on Monday through Friday, arrive by 7:20 am.  Assessments are provided on a first come, first served basis. Contact information: [place] [phone]        Services, [facility] Follow up.   Why: Referral made Contact information: [place] [address] [phone]                 Next level of care provider has access to [place] and Suicide Prevention discussed: Yes,  SPE completed with patient and Shaunt Guntrum [phone] (Mother)    Has patient been referred to the Quitline?: Yes, faxed on 06/07/2022  Patient has been referred for addiction treatment: Yes Patient referred to [facility] residential, Patient leaving [place] prior to referral appointment being made.  Social History   Substance and Sexual Activity  Alcohol Use Yes   Comment: Beer and alcohol   Social History   Substance and Sexual Activity  Drug Use Yes   Types: "Crack" cocaine, Marijuana   Comment: Last use of crack was today   Durenda Hurt, [place] 06/06/2022, 11:07 AM

## 2022-06-06 NOTE — [facility] Group Notes (Signed)
[facility] Group Notes:  (Nursing/MHT/Case Management/Adjunct)  Date:  06/06/2022  Time:  11:15 AM  Type of Therapy:  Group Therapy  Participation Level:  Active  Participation Quality:  Appropriate  Affect:  Appropriate  Cognitive:  Appropriate  Insight:  Appropriate  Engagement in Group:  Engaged  Modes of Intervention:  Discussion  Summary of Progress/Problems:  Patient attended and participated in an orientation/ goals group today. Patient's goal for today is to stay focus.   Frances Furbish R Sharman Garrott 06/06/2022, 11:15 AM

## 2023-04-06 ENCOUNTER — Other Ambulatory Visit: Payer: Self-pay

## 2023-04-06 ENCOUNTER — Emergency Department (HOSPITAL_COMMUNITY)
Admission: EM | Admit: 2023-04-06 | Discharge: 2023-04-07 | Disposition: A | Discharge status: Other Acute Inpatient Hospital | Payer: MEDICAID | Attending: Emergency Medicine | Admitting: Emergency Medicine

## 2023-04-06 ENCOUNTER — Encounter (HOSPITAL_COMMUNITY): Payer: Self-pay

## 2023-04-06 DIAGNOSIS — Z7984 Long term (current) use of oral hypoglycemic drugs: Secondary | ICD-10-CM | POA: Diagnosis not present

## 2023-04-06 DIAGNOSIS — R440 Auditory hallucinations: Secondary | ICD-10-CM | POA: Diagnosis not present

## 2023-04-06 DIAGNOSIS — Z20822 Contact with and (suspected) exposure to covid-19: Secondary | ICD-10-CM | POA: Insufficient documentation

## 2023-04-06 DIAGNOSIS — F1920 Other psychoactive substance dependence, uncomplicated: Secondary | ICD-10-CM

## 2023-04-06 DIAGNOSIS — F200 Paranoid schizophrenia: Secondary | ICD-10-CM | POA: Insufficient documentation

## 2023-04-06 DIAGNOSIS — F1910 Other psychoactive substance abuse, uncomplicated: Secondary | ICD-10-CM | POA: Insufficient documentation

## 2023-04-06 DIAGNOSIS — E119 Type 2 diabetes mellitus without complications: Secondary | ICD-10-CM | POA: Insufficient documentation

## 2023-04-06 DIAGNOSIS — Z794 Long term (current) use of insulin: Secondary | ICD-10-CM | POA: Insufficient documentation

## 2023-04-06 DIAGNOSIS — R45851 Suicidal ideations: Secondary | ICD-10-CM | POA: Diagnosis not present

## 2023-04-06 DIAGNOSIS — F1120 Opioid dependence, uncomplicated: Secondary | ICD-10-CM

## 2023-04-06 LAB — CBC WITH DIFFERENTIAL/PLATELET
Abs Immature Granulocytes: 0.01 10*3/uL (ref 0.00–0.07)
Basophils Absolute: 0 10*3/uL (ref 0.0–0.1)
Basophils Relative: 1 %
Eosinophils Absolute: 0.1 10*3/uL (ref 0.0–0.5)
Eosinophils Relative: 1 %
HCT: 46.8 % (ref 39.0–52.0)
Hemoglobin: 16 g/dL (ref 13.0–17.0)
Immature Granulocytes: 0 %
Lymphocytes Relative: 36 %
Lymphs Abs: 2.3 10*3/uL (ref 0.7–4.0)
MCH: 31.4 pg (ref 26.0–34.0)
MCHC: 34.2 g/dL (ref 30.0–36.0)
MCV: 91.9 fL (ref 80.0–100.0)
Monocytes Absolute: 0.4 10*3/uL (ref 0.1–1.0)
Monocytes Relative: 7 %
Neutro Abs: 3.6 10*3/uL (ref 1.7–7.7)
Neutrophils Relative %: 55 %
Platelets: 195 10*3/uL (ref 150–400)
RBC: 5.09 MIL/uL (ref 4.22–5.81)
RDW: 13.7 % (ref 11.5–15.5)
WBC: 6.5 10*3/uL (ref 4.0–10.5)
nRBC: 0 % (ref 0.0–0.2)

## 2023-04-06 LAB — COMPREHENSIVE METABOLIC PANEL
ALT: 19 U/L (ref 0–44)
AST: 25 U/L (ref 15–41)
Albumin: 3.9 g/dL (ref 3.5–5.0)
Alkaline Phosphatase: 82 U/L (ref 38–126)
Anion gap: 10 (ref 5–15)
BUN: 9 mg/dL (ref 6–20)
CO2: 22 mmol/L (ref 22–32)
Calcium: 8.9 mg/dL (ref 8.9–10.3)
Chloride: 105 mmol/L (ref 98–111)
Creatinine, Ser: 0.7 mg/dL (ref 0.61–1.24)
GFR, Estimated: >60 mL/min (ref >60)
Glucose, Bld: 112 mg/dL — ABNORMAL HIGH (ref 70–99)
Potassium: 3.5 mmol/L (ref 3.5–5.1)
Sodium: 137 mmol/L (ref 135–145)
Total Bilirubin: 0.5 mg/dL (ref 0.0–1.2)
Total Protein: 7.6 g/dL (ref 6.5–8.1)

## 2023-04-06 LAB — RAPID URINE DRUG SCREEN, HOSP PERFORMED
Amphetamines: NOT DETECTED
Barbiturates: NOT DETECTED
Benzodiazepines: NOT DETECTED
Cocaine: POSITIVE — AB
Opiates: NOT DETECTED
Tetrahydrocannabinol: POSITIVE — AB

## 2023-04-06 LAB — ACETAMINOPHEN LEVEL: Acetaminophen (Tylenol), Serum: <10 ug/mL — ABNORMAL LOW (ref 10–30)

## 2023-04-06 LAB — ETHANOL: Alcohol, Ethyl (B): 35 mg/dL — ABNORMAL HIGH (ref <10)

## 2023-04-06 LAB — SALICYLATE LEVEL: Salicylate Lvl: <7 mg/dL — ABNORMAL LOW (ref 7.0–30.0)

## 2023-04-06 MED ORDER — TRAZODONE HCL 100 MG PO TABS
100.0000 mg | ORAL_TABLET | Freq: Every day | ORAL | Status: DC
Start: 2023-04-06 — End: 2023-04-07
  Administered 2023-04-06: 100 mg via ORAL
  Filled 2023-04-06: qty 1

## 2023-04-06 MED ORDER — RISPERIDONE 0.5 MG PO TBDP
1.0000 mg | ORAL_TABLET | Freq: Two times a day (BID) | ORAL | Status: DC
  Administered 2023-04-06 – 2023-04-07 (×2): 1 mg via ORAL
  Filled 2023-04-06 (×2): qty 2

## 2023-04-06 MED ORDER — LOSARTAN POTASSIUM 50 MG PO TABS
50.0000 mg | ORAL_TABLET | Freq: Every day | ORAL | Status: DC
  Administered 2023-04-06 – 2023-04-07 (×2): 50 mg via ORAL
  Filled 2023-04-06: qty 2
  Filled 2023-04-06: qty 1

## 2023-04-06 NOTE — ED Provider Notes (Signed)
 [place] EMERGENCY DEPARTMENT AT [facility] Provider Note   CSN: [id number] Arrival date & time: 04/06/23  1352     History  Chief Complaint  Patient presents with  . Suicidal    Marciano Mundt is a 53 y.o. male with past medical history significant for paranoid schizophrenia, diabetes, tobacco use disorder, alcohol use disorder, cocaine use disorder, depression, generalized substance-induced mood disorder who presents with concern for suicidal ideation, reports that he does not want to be here and wants to blow his brains out.  Shaky and paranoid in triage.  Reports that he has not been taking home medications including blood pressure medication, psychiatric medications.  He takes Risperdal , trazodone  historically.  HPI     Home Medications Prior to Admission medications   Medication Sig Start Date End Date Taking? Authorizing Provider  ACCU-CHEK GUIDE test strip 3 (three) times daily. 05/01/20   [provider]  gabapentin  (NEURONTIN ) 300 MG capsule Take 300 mg by mouth 3 (three) times daily. Patient not taking: Reported on 06/02/2022 04/25/20   [provider]  insulin  aspart (NOVOLOG ) 100 UNIT/ML injection Inject 4 Units into the skin 3 (three) times daily with meals. Patient not taking: Reported on 05/24/2020 01/07/18   Money, Caron NOVAK, FNP  insulin  glargine (LANTUS ) 100 UNIT/ML injection Inject 0.4 mLs (40 Units total) into the skin every morning. Patient not taking: Reported on 06/02/2022 01/08/18   Money, Caron NOVAK, FNP  losartan  (COZAAR ) 50 MG tablet Take 50 mg by mouth daily. Patient not taking: Reported on 06/02/2022 04/25/20   [provider]  metFORMIN  (GLUCOPHAGE ) 500 MG tablet Take 1 tablet (500 mg total) by mouth 2 (two) times daily with a meal. Patient not taking: Reported on 06/02/2022 01/07/18   Money, Caron NOVAK, FNP  risperiDONE  (RISPERDAL ) 2 MG tablet Take 1 tablet (2 mg total) by mouth daily. 06/06/22   Leevy-Johnson, Brooke A, NP   tiZANidine (ZANAFLEX) 4 MG tablet Take 4 mg by mouth at bedtime. Muscle spasm Patient not taking: Reported on 06/02/2022 05/09/20   [provider]  traZODone  (DESYREL ) 50 MG tablet Take 1 tablet (50 mg total) by mouth at bedtime as needed for sleep. 06/06/22   Leevy-Johnson, Lyle LABOR, NP  triamcinolone  cream (KENALOG ) 0.5 % Apply topically 2 (two) times daily as needed. Patient not taking: Reported on 06/02/2022 04/25/20   [provider]  Vitamin D , Ergocalciferol , (DRISDOL ) 1.25 MG (50000 UNIT) CAPS capsule Take 50,000 Units by mouth every Monday. Patient not taking: Reported on 06/02/2022 04/29/20   [provider]      Allergies    Patient has no known allergies.    Review of Systems   Review of Systems  All other systems reviewed and are negative.   Physical Exam Updated Vital Signs BP (!) 216/137 (BP Location: Right Arm)   Pulse 95   Temp 97.8 F (36.6 C) (Oral)   Resp 20   Ht 6' 3 (1.905 m)   Wt 89.2 kg   SpO2 98%   BMI 24.58 kg/m  Physical Exam Vitals and nursing note reviewed.  Constitutional:      General: He is not in acute distress.    Appearance: Normal appearance.  HENT:     Head: Normocephalic and atraumatic.  Eyes:     General:        Right eye: No discharge.        Left eye: No discharge.  Cardiovascular:     Rate and Rhythm: Normal  rate and regular rhythm.     Heart sounds: No murmur heard.    No friction rub. No gallop.  Pulmonary:     Effort: Pulmonary effort is normal.     Breath sounds: Normal breath sounds.  Abdominal:     General: Bowel sounds are normal.     Palpations: Abdomen is soft.  Skin:    General: Skin is warm and dry.     Capillary Refill: Capillary refill takes less than 2 seconds.  Neurological:     Mental Status: He is alert and oriented to person, place, and time.  Psychiatric:     Comments: Depressed mood, anhedonia, tearful, somewhat shaky, but responds to questions appropriately.  Reports that if he  does not kill himself other people will kill him, will not say who he is discussing, suspect hallucination.     ED Results / Procedures / Treatments   Labs (all labs ordered are listed, but only abnormal results are displayed) Labs Reviewed  COMPREHENSIVE METABOLIC PANEL - Abnormal; Notable for the following components:      Result Value   Glucose, Bld 112 (*)    All other components within normal limits  ETHANOL - Abnormal; Notable for the following components:   Alcohol, Ethyl (B) 35 (*)    All other components within normal limits  SALICYLATE LEVEL - Abnormal; Notable for the following components:   Salicylate Lvl <7.0 (*)    All other components within normal limits  ACETAMINOPHEN  LEVEL - Abnormal; Notable for the following components:   Acetaminophen  (Tylenol ), Serum <10 (*)    All other components within normal limits  RAPID URINE DRUG SCREEN, HOSP PERFORMED - Abnormal; Notable for the following components:   Cocaine POSITIVE (*)    Tetrahydrocannabinol POSITIVE (*)    All other components within normal limits  CBC WITH DIFFERENTIAL/PLATELET    EKG None  Radiology No results found.  Procedures Procedures    Medications Ordered in ED Medications  losartan  (COZAAR ) tablet 50 mg (50 mg Oral Given 04/06/23 1524)    ED Course/ Medical Decision Making/ A&P Clinical Course as of 04/06/23 1838  Wed Apr 06, 2023  1837 Blood pressure improved, cleared for TTS eval. [CP]    Clinical Course User Index [CP] Rosan Sherlean DEL, PA-C                                 Medical Decision Making Amount and/or Complexity of Data Reviewed Labs: ordered.  Risk Prescription drug management.   Patient is a 53 y.o. male  who presents to the emergency department for psychiatric complaint.  Past Medical History: paranoid schizophrenia, diabetes, tobacco use disorder, alcohol use disorder, cocaine use disorder, depression, generalized substance-induced mood  disorder  Physical Exam: Quite hypertensive on arrival, blood pressure 216/137, he is tearful, endorsing suicidal ideation, he also reports that there are people that are going to kill him if he does not kill himself, seems to be dealing with hallucinations.  Labs: Medical clearance labs ordered, with following pertinent results: CBC unremarkable, CMP unremarkable, UDS positive for cocaine, THC.  His ethanol level is mildly elevated at 35 suggesting that he likely had some alcohol at some point over the last few hours but not acutely intoxicated at this time.  Cardiac monitoring: EKG obtained and interpreted by attending physician which shows: NSR, ST elevation likely LVH / early repol  Medications: I ordered medication including losartan  50mg   for hypertension. I have reviewed the patients home medicines and have made adjustments as needed.  Disposition: Patient is otherwise medically cleared at this time pending medical clearance laboratory evaluation. Will consult TTS and appreciate their recommendations.  I discussed this case with my attending physician Dr. Dreama who cosigned this note including patient's presenting symptoms, physical exam, and planned diagnostics and interventions. Attending physician stated agreement with plan or made changes to plan which were implemented.   Final Clinical Impression(s) / ED Diagnoses Final diagnoses:  Suicidal ideation    Rx / DC Orders ED Discharge Orders     None         Rosan Sherlean DEL, PA-C 04/06/23 1707    Dreama Longs, MD 04/06/23 2315
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
## 2023-04-06 NOTE — ED Notes (Signed)
 Patient belonging bag moved to [employer].

## 2023-04-06 NOTE — ED Notes (Signed)
 Patient given meal tray.

## 2023-04-06 NOTE — ED Notes (Signed)
 IVC [id number]

## 2023-04-06 NOTE — ED Notes (Signed)
 Patient dressed out in burgandy scrubs. Wanded by security. 1 patient belonging bag located at triage nurse station.

## 2023-04-06 NOTE — ED Triage Notes (Signed)
 Pt states that he does not want to be here anymore and will blow his brains out. Pt is shaky and paranoid in triage. Pt reports using drugs will not specify. Pt is tearful.

## 2023-04-06 NOTE — Progress Notes (Signed)
 LCSW Progress Note  [id number]   Steven Christensen  04/06/2023  11:40 PM    Inpatient Behavioral Health Placement  Pt meets inpatient criteria per [name] Patient, NP. There are no available beds within [facility] system per [facility] [employer]. Referral was sent to the following facilities;   Destination  Service Provider Address Phone Fax  [facility] [address] [place] [address] [phone]  [facility] [address] [place], [place] [address] [phone]  [facility] [address] [place], [place] [address] [phone]  [facility] [address] [place] [address] [phone]  [facility] [address] [place] [address] [phone]  [facility] [address], [place] [address] [phone]  [facility] [address] [place] [address] [phone]  [facility] [address], [place] [address] [phone]  [facility] [address] [place] [address] [phone]  [facility] [place] [phone]  [facility] [address] [place] [address] [phone]  [facility] EFAX [address] [place] [phone]  [facility] [address], [place] [address] [phone]  [facility] [address] [place] [address] [phone]  [facility] [address] [place] [address] [phone]  [facility] [address] [place] [address] [phone]  [facility] [address], [place] [address] [phone]  [facility] [address] [place] [address] [phone]  [facility] [address] [place] [address] [phone]  [facility] [place] [phone]  [facility] [address], [place] [address] [phone]  [facility] [address], [place] [address] [phone]  [facility] Patient Placement [facility], [place] [phone]  [facility] [address] [place] [address] [phone]    Situation ongoing,  CSW will follow up.    [name], MSW, LCSWA 04/06/2023 11:40 PM

## 2023-04-06 NOTE — ED Provider Notes (Signed)
 Patient is medically cleared for psychiatric inpatient admission.   Terald Sleeper, MD 04/06/23 2240

## 2023-04-07 LAB — SARS CORONAVIRUS 2 BY RT PCR: SARS Coronavirus 2 by RT PCR: NEGATIVE

## 2023-04-07 NOTE — ED Notes (Signed)
 [facility] called and will be here soon to transport pt to [facility]

## 2023-04-07 NOTE — ED Notes (Signed)
 Breakfast tray given.

## 2023-04-07 NOTE — ED Notes (Addendum)
 Marisue Ivan at [facility] given report. Transport called and it will be after 3pm when second shift comes in before transported

## 2023-04-07 NOTE — Progress Notes (Signed)
 Pt has been accepted to [facility] today 04/07/2023 Bed assignment: GERO UNIT PENDING ITEMS BELOW   Pt meets inpatient criteria per: Cathaleen Adam, PMHNP   Attending Physician will be: Kristen Rase MD   Report can be called to: [phone]  Pt can arrive after (pending items NEG COVID IVC PAPERS  FAX # [phone]  Care Team Notified: Cathaleen Adam, PMHNP , Chesley Holt NT, April Wilson paramedic.   Tunisia Andrez Lieurance LCSW-A   04/07/2023 10:12 AM

## 2023-04-07 NOTE — ED Notes (Signed)
 Lunch tray given.

## 2023-04-07 NOTE — ED Provider Notes (Signed)
 Pt has been accepted to inpatient psych at [facility], Dr Renato Gails.   Pt is awake and alert, no acute distress. Vitals stable.   Pt currently appears stable for transfer/transport.   Cathren Laine, MD 04/07/23 [phone]

## 2023-07-22 ENCOUNTER — Emergency Department (HOSPITAL_COMMUNITY)
Admission: EM | Admit: 2023-07-22 | Discharge: 2023-07-22 | Disposition: A | Discharge status: Home / Self Care | Payer: MEDICAID | Attending: Emergency Medicine | Admitting: Emergency Medicine

## 2023-07-22 ENCOUNTER — Other Ambulatory Visit: Payer: Self-pay

## 2023-07-22 ENCOUNTER — Emergency Department (HOSPITAL_COMMUNITY): Payer: MEDICAID

## 2023-07-22 DIAGNOSIS — R079 Chest pain, unspecified: Secondary | ICD-10-CM | POA: Insufficient documentation

## 2023-07-22 DIAGNOSIS — Z5329 Procedure and treatment not carried out because of patient's decision for other reasons: Secondary | ICD-10-CM | POA: Insufficient documentation

## 2023-07-22 DIAGNOSIS — R110 Nausea: Secondary | ICD-10-CM | POA: Insufficient documentation

## 2023-07-22 DIAGNOSIS — Z794 Long term (current) use of insulin: Secondary | ICD-10-CM | POA: Diagnosis not present

## 2023-07-22 LAB — TROPONIN I (HIGH SENSITIVITY)
Troponin I (High Sensitivity): 38 ng/L — ABNORMAL HIGH (ref <18)
Troponin I (High Sensitivity): 36 ng/L — ABNORMAL HIGH (ref <18)

## 2023-07-22 LAB — RAPID URINE DRUG SCREEN, HOSP PERFORMED
Amphetamines: NOT DETECTED
Barbiturates: NOT DETECTED
Benzodiazepines: NOT DETECTED
Cocaine: POSITIVE — AB
Opiates: POSITIVE — AB
Tetrahydrocannabinol: POSITIVE — AB

## 2023-07-22 LAB — CBC WITH DIFFERENTIAL/PLATELET
Abs Immature Granulocytes: 0.01 10*3/uL (ref 0.00–0.07)
Basophils Absolute: 0 10*3/uL (ref 0.0–0.1)
Basophils Relative: 1 %
Eosinophils Absolute: 0.1 10*3/uL (ref 0.0–0.5)
Eosinophils Relative: 1 %
HCT: 43 % (ref 39.0–52.0)
Hemoglobin: 14.5 g/dL (ref 13.0–17.0)
Immature Granulocytes: 0 %
Lymphocytes Relative: 24 %
Lymphs Abs: 1.8 10*3/uL (ref 0.7–4.0)
MCH: 31.7 pg (ref 26.0–34.0)
MCHC: 33.7 g/dL (ref 30.0–36.0)
MCV: 94.1 fL (ref 80.0–100.0)
Monocytes Absolute: 0.6 10*3/uL (ref 0.1–1.0)
Monocytes Relative: 7 %
Neutro Abs: 5.2 10*3/uL (ref 1.7–7.7)
Neutrophils Relative %: 67 %
Platelets: 166 10*3/uL (ref 150–400)
RBC: 4.57 MIL/uL (ref 4.22–5.81)
RDW: 14.8 % (ref 11.5–15.5)
WBC: 7.7 10*3/uL (ref 4.0–10.5)
nRBC: 0 % (ref 0.0–0.2)

## 2023-07-22 LAB — COMPREHENSIVE METABOLIC PANEL WITH GFR
ALT: 23 U/L (ref 0–44)
AST: 24 U/L (ref 15–41)
Albumin: 3.3 g/dL — ABNORMAL LOW (ref 3.5–5.0)
Alkaline Phosphatase: 71 U/L (ref 38–126)
Anion gap: 10 (ref 5–15)
BUN: 5 mg/dL — ABNORMAL LOW (ref 6–20)
CO2: 24 mmol/L (ref 22–32)
Calcium: 8.7 mg/dL — ABNORMAL LOW (ref 8.9–10.3)
Chloride: 106 mmol/L (ref 98–111)
Creatinine, Ser: 0.77 mg/dL (ref 0.61–1.24)
GFR, Estimated: >60 mL/min (ref >60)
Glucose, Bld: 139 mg/dL — ABNORMAL HIGH (ref 70–99)
Potassium: 3.5 mmol/L (ref 3.5–5.1)
Sodium: 140 mmol/L (ref 135–145)
Total Bilirubin: 0.6 mg/dL (ref 0.0–1.2)
Total Protein: 6.7 g/dL (ref 6.5–8.1)

## 2023-07-22 MED ORDER — HYDROMORPHONE HCL 1 MG/ML IJ SOLN: 1.0000 mg | Freq: Once | INTRAMUSCULAR | Status: DC

## 2023-07-22 MED ORDER — NITROGLYCERIN 0.4 MG SL SUBL
0.4000 mg | SUBLINGUAL_TABLET | SUBLINGUAL | Status: DC | PRN
  Administered 2023-07-22: 0.4 mg via SUBLINGUAL
  Filled 2023-07-22: qty 1

## 2023-07-22 NOTE — ED Provider Notes (Signed)
 [facility] Provider Note   CSN: [id number] Arrival date & time: 07/22/23  1044     History  Chief Complaint  Patient presents with   Chest Pain    Steven Christensen is a 53 y.o. male.  HPI   53 year old male presents to the emergency department with ongoing left lower chest pain.  Patient states that this started sometime overnight.  He admits to EtOH and cocaine last night, unknown time of ingestion.  Since the pain has started it has been persistent, waxing and waning in severity.  No radiation to his back or abdomen.  Mild nausea but no vomiting or diarrhea.  Denies any shortness of breath, cough, hemoptysis.  Home Medications Prior to Admission medications   Medication Sig Start Date End Date Taking? Authorizing Provider  gabapentin  (NEURONTIN ) 300 MG capsule Take 300 mg by mouth 3 (three) times daily. Patient not taking: Reported on 06/02/2022 04/25/20   [provider]  insulin  aspart (NOVOLOG ) 100 UNIT/ML injection Inject 4 Units into the skin 3 (three) times daily with meals. Patient not taking: Reported on 05/24/2020 01/07/18   Money, Caron NOVAK, FNP  insulin  glargine (LANTUS ) 100 UNIT/ML injection Inject 0.4 mLs (40 Units total) into the skin every morning. Patient not taking: Reported on 06/02/2022 01/08/18   Money, Caron NOVAK, FNP  losartan  (COZAAR ) 50 MG tablet Take 50 mg by mouth daily. Patient not taking: Reported on 06/02/2022 04/25/20   [provider]  metFORMIN  (GLUCOPHAGE ) 500 MG tablet Take 1 tablet (500 mg total) by mouth 2 (two) times daily with a meal. Patient not taking: Reported on 06/02/2022 01/07/18   Money, Caron NOVAK, FNP  risperiDONE  (RISPERDAL ) 2 MG tablet Take 1 tablet (2 mg total) by mouth daily. Patient not taking: Reported on 04/07/2023 06/06/22   Judythe Lyle LABOR, NP  traZODone  (DESYREL ) 50 MG tablet Take 1 tablet (50 mg total) by mouth at bedtime as needed for sleep. Patient not taking:  Reported on 04/07/2023 06/06/22   Judythe Lyle LABOR, NP      Allergies    Patient has no known allergies.    Review of Systems   Review of Systems  Constitutional:  Negative for fever.  Respiratory:  Negative for shortness of breath.   Cardiovascular:  Positive for chest pain. Negative for palpitations and leg swelling.  Gastrointestinal:  Negative for abdominal pain, diarrhea and vomiting.  Genitourinary:  Negative for flank pain.  Musculoskeletal:  Negative for back pain.  Skin:  Negative for rash.  Neurological:  Negative for headaches.    Physical Exam Updated Vital Signs BP (!) 152/91   Pulse 75   Temp 97.7 F (36.5 C)   Resp 18   Ht 6' 3 (1.905 m)   Wt 86.2 kg   SpO2 93%   BMI 23.75 kg/m  Physical Exam Vitals and nursing note reviewed.  Constitutional:      General: He is not in acute distress.    Appearance: Normal appearance.  HENT:     Head: Normocephalic.     Mouth/Throat:     Mouth: Mucous membranes are moist.  Cardiovascular:     Rate and Rhythm: Normal rate.  Pulmonary:     Effort: Pulmonary effort is normal. No respiratory distress.  Abdominal:     Palpations: Abdomen is soft.     Tenderness: There is no abdominal tenderness.  Musculoskeletal:     Right lower leg: No edema.     Left lower  leg: No edema.  Skin:    General: Skin is warm.  Neurological:     Mental Status: He is alert and oriented to person, place, and time. Mental status is at baseline.  Psychiatric:        Mood and Affect: Mood normal.     ED Results / Procedures / Treatments   Labs (all labs ordered are listed, but only abnormal results are displayed) Labs Reviewed  COMPREHENSIVE METABOLIC PANEL WITH GFR - Abnormal; Notable for the following components:      Result Value   Glucose, Bld 139 (*)    BUN 5 (*)    Calcium  8.7 (*)    Albumin  3.3 (*)    All other components within normal limits  TROPONIN I (HIGH SENSITIVITY) - Abnormal; Notable for the following  components:   Troponin I (High Sensitivity) 38 (*)    All other components within normal limits  CBC WITH DIFFERENTIAL/PLATELET  RAPID URINE DRUG SCREEN, HOSP PERFORMED  TROPONIN I (HIGH SENSITIVITY)    EKG EKG Interpretation Date/Time:  Friday July 22 2023 13:04:46 EDT Ventricular Rate:  67 PR Interval:  189 QRS Duration:  100 QT Interval:  460 QTC Calculation: 486 R Axis:   92  Text Interpretation: Sinus rhythm Borderline right axis deviation Anteroseptal infarct, old Confirmed by Bari Flank [phone]) on 07/22/2023 1:39:54 PM  Radiology DG Chest 1 View Result Date: 07/22/2023 CLINICAL DATA:  Chest pain. EXAM: CHEST  1 VIEW COMPARISON:  04/05/2021. FINDINGS: Bilateral lung fields are clear. Bilateral costophrenic angles are clear. Normal cardio-mediastinal silhouette. No acute osseous abnormalities. The soft tissues are within normal limits. IMPRESSION: No active disease. Electronically Signed   By: Ree Molt M.D.   On: 07/22/2023 11:33    Procedures Procedures    Medications Ordered in ED Medications  nitroGLYCERIN  (NITROSTAT ) SL tablet 0.4 mg (0.4 mg Sublingual Given 07/22/23 1302)    ED Course/ Medical Decision Making/ A&P Clinical Course as of 07/22/23 1540  Fri Jul 22, 2023  1526 Stable 53YOM with a chief complaint of chest pain. States that it started with PSU last night EKG changes old Improved with nitro Cardiology to come see [CC]    Clinical Course User Index [CC] Jerral Meth, MD                                 Medical Decision Making Risk Prescription drug management.   53 year old male presents to the emergency department with concern for pain in the left lower chest.  Admits to EtOH and cocaine use overnight.  Pain is improved with sublingual nitroglycerin , vitals are otherwise stable.  EKG shows sinus rhythm, ST changes are unchanged.  Blood work shows a troponin slightly elevated at 38, trending to 36.  Chest pain is improved but not  resolved.  No shortness of breath, hypoxia.  There was 1 tachycardic reading in his vital signs but on my evaluation he has never been tachycardic.  Lower suspicion at this time for PE.  However with ongoing chest discomfort plan for cardiology consult.  During cardiology consult the patient is requested to leave AGAINST MEDICAL ADVICE.  I have recommended that the patient stays in the department for the completion of their workup however they decline.  I have expressed the importance of staying including the risks of leaving which include worsening condition, permanent disability and death.  Patient accepts these risks.  They are alert and oriented,  they have capacity to make this decision.  I have not been able to convince the patient to stay and they understand the risks of leaving AGAINST MEDICAL ADVICE.          Final Clinical Impression(s) / ED Diagnoses Final diagnoses:  None    Rx / DC Orders ED Discharge Orders     None         Bari Roxie HERO, DO 07/22/23 1541

## 2023-07-22 NOTE — ED Triage Notes (Signed)
 Left side severe stabbing pain since last night. Pt reported ETOH and crack last night. Cancelled STEMI.  Pt is nauseaed. ASA 325mg  aspirin , 4 mg zofran , 4 morphine and 1 ntg.

## 2023-07-22 NOTE — ED Notes (Signed)
 Called CCMD to place on monitor

## 2023-07-22 NOTE — Discharge Instructions (Addendum)
You have chosen to leave the emergency department AGAINST MEDICAL ADVICE.  I have explained to you your testing results and the need for further evaluation/admission.  You have verbalized understanding of these results and plan and are choosing to leave the hospital AGAINST MEDICAL ADVICE. You understand the risks associated with leaving without further evaluation/treatment. If you have any worsening symptoms or choose to be treated please return immediately to emergency department.

## 2023-07-22 NOTE — ED Notes (Signed)
 Pt understood d/c instructions and when to go home. Refused to sign AMA form but stated that he just wanted to go home. D/C paperwork was given and IV d/c

## 2023-08-03 ENCOUNTER — Telehealth: Payer: Self-pay

## 2023-08-03 NOTE — Telephone Encounter (Signed)
-----   Message from Debria Fang sent at 07/22/2023  3:45 PM EDT ----- Regarding: Outpatient appointment Good Afternoon, this patient was seen in the [facility] ED today for chest pain. ED requesting we make a follow up appointment. Can we please get him a new patient appointment within the next 6 weeks?  Thanks [employer]

## 2023-08-03 NOTE — Telephone Encounter (Signed)
 LVM to schedule NP appt but unable to reach him, will send letter.

## 2024-01-03 ENCOUNTER — Inpatient Hospital Stay (HOSPITAL_COMMUNITY)
Admission: EM | Admit: 2024-01-03 | Discharge: 2024-02-04 | DRG: 870 | Disposition: E | Payer: MEDICAID | Attending: Pulmonary Disease | Admitting: Pulmonary Disease

## 2024-01-03 ENCOUNTER — Other Ambulatory Visit: Payer: Self-pay

## 2024-01-03 ENCOUNTER — Inpatient Hospital Stay (HOSPITAL_COMMUNITY): Payer: MEDICAID

## 2024-01-03 ENCOUNTER — Encounter (HOSPITAL_COMMUNITY): Payer: Self-pay | Admitting: Emergency Medicine

## 2024-01-03 ENCOUNTER — Ambulatory Visit (HOSPITAL_COMMUNITY): Payer: MEDICAID

## 2024-01-03 ENCOUNTER — Emergency Department (HOSPITAL_COMMUNITY): Payer: MEDICAID

## 2024-01-03 DIAGNOSIS — G9341 Metabolic encephalopathy: Secondary | ICD-10-CM | POA: Diagnosis present

## 2024-01-03 DIAGNOSIS — R6520 Severe sepsis without septic shock: Secondary | ICD-10-CM | POA: Diagnosis present

## 2024-01-03 DIAGNOSIS — L89896 Pressure-induced deep tissue damage of other site: Secondary | ICD-10-CM | POA: Diagnosis present

## 2024-01-03 DIAGNOSIS — F17200 Nicotine dependence, unspecified, uncomplicated: Secondary | ICD-10-CM | POA: Diagnosis present

## 2024-01-03 DIAGNOSIS — J9602 Acute respiratory failure with hypercapnia: Secondary | ICD-10-CM | POA: Diagnosis present

## 2024-01-03 DIAGNOSIS — R4182 Altered mental status, unspecified: Secondary | ICD-10-CM | POA: Diagnosis not present

## 2024-01-03 DIAGNOSIS — E11649 Type 2 diabetes mellitus with hypoglycemia without coma: Secondary | ICD-10-CM | POA: Diagnosis not present

## 2024-01-03 DIAGNOSIS — E162 Hypoglycemia, unspecified: Secondary | ICD-10-CM | POA: Diagnosis not present

## 2024-01-03 DIAGNOSIS — S40821A Blister (nonthermal) of right upper arm, initial encounter: Secondary | ICD-10-CM | POA: Diagnosis present

## 2024-01-03 DIAGNOSIS — I469 Cardiac arrest, cause unspecified: Secondary | ICD-10-CM | POA: Diagnosis not present

## 2024-01-03 DIAGNOSIS — E870 Hyperosmolality and hypernatremia: Secondary | ICD-10-CM | POA: Diagnosis not present

## 2024-01-03 DIAGNOSIS — N179 Acute kidney failure, unspecified: Secondary | ICD-10-CM | POA: Diagnosis present

## 2024-01-03 DIAGNOSIS — Z1152 Encounter for screening for COVID-19: Secondary | ICD-10-CM

## 2024-01-03 DIAGNOSIS — Z789 Other specified health status: Secondary | ICD-10-CM

## 2024-01-03 DIAGNOSIS — E1165 Type 2 diabetes mellitus with hyperglycemia: Secondary | ICD-10-CM | POA: Diagnosis not present

## 2024-01-03 DIAGNOSIS — F1410 Cocaine abuse, uncomplicated: Secondary | ICD-10-CM | POA: Diagnosis present

## 2024-01-03 DIAGNOSIS — L89306 Pressure-induced deep tissue damage of unspecified buttock: Secondary | ICD-10-CM | POA: Diagnosis present

## 2024-01-03 DIAGNOSIS — J690 Pneumonitis due to inhalation of food and vomit: Secondary | ICD-10-CM | POA: Diagnosis present

## 2024-01-03 DIAGNOSIS — Z9151 Personal history of suicidal behavior: Secondary | ICD-10-CM

## 2024-01-03 DIAGNOSIS — J9692 Respiratory failure, unspecified with hypercapnia: Secondary | ICD-10-CM | POA: Diagnosis not present

## 2024-01-03 DIAGNOSIS — J9601 Acute respiratory failure with hypoxia: Secondary | ICD-10-CM | POA: Diagnosis not present

## 2024-01-03 DIAGNOSIS — Z515 Encounter for palliative care: Secondary | ICD-10-CM | POA: Diagnosis not present

## 2024-01-03 DIAGNOSIS — I129 Hypertensive chronic kidney disease with stage 1 through stage 4 chronic kidney disease, or unspecified chronic kidney disease: Secondary | ICD-10-CM | POA: Diagnosis present

## 2024-01-03 DIAGNOSIS — X58XXXA Exposure to other specified factors, initial encounter: Secondary | ICD-10-CM | POA: Diagnosis present

## 2024-01-03 DIAGNOSIS — E876 Hypokalemia: Secondary | ICD-10-CM | POA: Diagnosis not present

## 2024-01-03 DIAGNOSIS — R569 Unspecified convulsions: Secondary | ICD-10-CM | POA: Diagnosis not present

## 2024-01-03 DIAGNOSIS — M6282 Rhabdomyolysis: Secondary | ICD-10-CM | POA: Diagnosis present

## 2024-01-03 DIAGNOSIS — J189 Pneumonia, unspecified organism: Secondary | ICD-10-CM | POA: Diagnosis present

## 2024-01-03 DIAGNOSIS — T50916A Underdosing of multiple unspecified drugs, medicaments and biological substances, initial encounter: Secondary | ICD-10-CM | POA: Diagnosis present

## 2024-01-03 DIAGNOSIS — G931 Anoxic brain damage, not elsewhere classified: Secondary | ICD-10-CM | POA: Diagnosis present

## 2024-01-03 DIAGNOSIS — J90 Pleural effusion, not elsewhere classified: Secondary | ICD-10-CM | POA: Diagnosis present

## 2024-01-03 DIAGNOSIS — F1010 Alcohol abuse, uncomplicated: Secondary | ICD-10-CM | POA: Diagnosis present

## 2024-01-03 DIAGNOSIS — J9691 Respiratory failure, unspecified with hypoxia: Secondary | ICD-10-CM

## 2024-01-03 DIAGNOSIS — Z7984 Long term (current) use of oral hypoglycemic drugs: Secondary | ICD-10-CM

## 2024-01-03 DIAGNOSIS — Z66 Do not resuscitate: Secondary | ICD-10-CM | POA: Diagnosis not present

## 2024-01-03 DIAGNOSIS — Z91148 Patient's other noncompliance with medication regimen for other reason: Secondary | ICD-10-CM

## 2024-01-03 DIAGNOSIS — E1122 Type 2 diabetes mellitus with diabetic chronic kidney disease: Secondary | ICD-10-CM | POA: Diagnosis not present

## 2024-01-03 DIAGNOSIS — Z794 Long term (current) use of insulin: Secondary | ICD-10-CM

## 2024-01-03 DIAGNOSIS — Z7189 Other specified counseling: Secondary | ICD-10-CM | POA: Diagnosis not present

## 2024-01-03 DIAGNOSIS — R7989 Other specified abnormal findings of blood chemistry: Secondary | ICD-10-CM | POA: Diagnosis not present

## 2024-01-03 DIAGNOSIS — F200 Paranoid schizophrenia: Secondary | ICD-10-CM | POA: Diagnosis present

## 2024-01-03 DIAGNOSIS — R21 Rash and other nonspecific skin eruption: Secondary | ICD-10-CM | POA: Diagnosis not present

## 2024-01-03 DIAGNOSIS — E872 Acidosis, unspecified: Secondary | ICD-10-CM | POA: Diagnosis not present

## 2024-01-03 DIAGNOSIS — A419 Sepsis, unspecified organism: Secondary | ICD-10-CM | POA: Diagnosis present

## 2024-01-03 DIAGNOSIS — Z79899 Other long term (current) drug therapy: Secondary | ICD-10-CM

## 2024-01-03 DIAGNOSIS — L89151 Pressure ulcer of sacral region, stage 1: Secondary | ICD-10-CM | POA: Diagnosis present

## 2024-01-03 DIAGNOSIS — L89816 Pressure-induced deep tissue damage of head: Secondary | ICD-10-CM | POA: Diagnosis present

## 2024-01-03 DIAGNOSIS — I2489 Other forms of acute ischemic heart disease: Secondary | ICD-10-CM | POA: Diagnosis present

## 2024-01-03 DIAGNOSIS — G928 Other toxic encephalopathy: Secondary | ICD-10-CM | POA: Diagnosis present

## 2024-01-03 DIAGNOSIS — R220 Localized swelling, mass and lump, head: Secondary | ICD-10-CM

## 2024-01-03 HISTORY — DX: Other psychoactive substance abuse, uncomplicated: F19.10

## 2024-01-03 HISTORY — DX: Cannabis abuse, uncomplicated: F12.10

## 2024-01-03 HISTORY — DX: Suicidal ideations: R45.851

## 2024-01-03 HISTORY — DX: Schizoaffective disorder, unspecified: F25.9

## 2024-01-03 HISTORY — DX: Major depressive disorder, single episode, unspecified: F32.9

## 2024-01-03 HISTORY — DX: Alcohol abuse, uncomplicated: F10.10

## 2024-01-03 HISTORY — DX: Cocaine abuse, uncomplicated: F14.10

## 2024-01-03 HISTORY — DX: Schizophrenia, unspecified: F20.9

## 2024-01-03 HISTORY — DX: Type 2 diabetes mellitus without complications: E11.9

## 2024-01-03 HISTORY — DX: Tobacco use: Z72.0

## 2024-01-03 LAB — CK TOTAL AND CKMB (NOT AT ARMC)
CK, MB: 32.2 ng/mL — ABNORMAL HIGH (ref 0.5–5.0)
Total CK: 1475 U/L — ABNORMAL HIGH (ref 49–397)

## 2024-01-03 LAB — CBC WITH DIFFERENTIAL/PLATELET
Abs Immature Granulocytes: 0.14 K/uL — ABNORMAL HIGH (ref 0.00–0.07)
Basophils Absolute: 0 K/uL (ref 0.0–0.1)
Basophils Relative: 0 %
Eosinophils Absolute: 0 K/uL (ref 0.0–0.5)
Eosinophils Relative: 0 %
HCT: 43.7 % (ref 39.0–52.0)
Hemoglobin: 14.7 g/dL (ref 13.0–17.0)
Immature Granulocytes: 1 %
Lymphocytes Relative: 15 %
Lymphs Abs: 2.9 K/uL (ref 0.7–4.0)
MCH: 30.6 pg (ref 26.0–34.0)
MCHC: 33.6 g/dL (ref 30.0–36.0)
MCV: 90.9 fL (ref 80.0–100.0)
Monocytes Absolute: 0.9 K/uL (ref 0.1–1.0)
Monocytes Relative: 5 %
Neutro Abs: 15.7 K/uL — ABNORMAL HIGH (ref 1.7–7.7)
Neutrophils Relative %: 79 %
Platelets: 335 K/uL (ref 150–400)
RBC: 4.81 MIL/uL (ref 4.22–5.81)
RDW: 15.9 % — ABNORMAL HIGH (ref 11.5–15.5)
WBC: 19.6 K/uL — ABNORMAL HIGH (ref 4.0–10.5)
nRBC: 0 % (ref 0.0–0.2)

## 2024-01-03 LAB — I-STAT ARTERIAL BLOOD GAS, ED
Acid-base deficit: 4 mmol/L — ABNORMAL HIGH (ref 0.0–2.0)
Bicarbonate: 24.1 mmol/L (ref 20.0–28.0)
Calcium, Ion: 1.11 mmol/L — ABNORMAL LOW (ref 1.15–1.40)
HCT: 37 % — ABNORMAL LOW (ref 39.0–52.0)
Hemoglobin: 12.6 g/dL — ABNORMAL LOW (ref 13.0–17.0)
O2 Saturation: 100 %
Patient temperature: 98.7
Potassium: <2 mmol/L — CL (ref 3.5–5.1)
Sodium: 146 mmol/L — ABNORMAL HIGH (ref 135–145)
TCO2: 26 mmol/L (ref 22–32)
pCO2 arterial: 55.3 mmHg — ABNORMAL HIGH (ref 32–48)
pH, Arterial: 7.247 — ABNORMAL LOW (ref 7.35–7.45)
pO2, Arterial: 280 mmHg — ABNORMAL HIGH (ref 83–108)

## 2024-01-03 LAB — COMPREHENSIVE METABOLIC PANEL WITH GFR
ALT: 22 U/L (ref 0–44)
AST: 45 U/L — ABNORMAL HIGH (ref 15–41)
Albumin: 1.8 g/dL — ABNORMAL LOW (ref 3.5–5.0)
Alkaline Phosphatase: 87 U/L (ref 38–126)
Anion gap: 17 — ABNORMAL HIGH (ref 5–15)
BUN: 34 mg/dL — ABNORMAL HIGH (ref 6–20)
CO2: 20 mmol/L — ABNORMAL LOW (ref 22–32)
Calcium: 7.8 mg/dL — ABNORMAL LOW (ref 8.9–10.3)
Chloride: 111 mmol/L (ref 98–111)
Creatinine, Ser: 5.11 mg/dL — ABNORMAL HIGH (ref 0.61–1.24)
GFR, Estimated: 13 mL/min — ABNORMAL LOW (ref >60)
Glucose, Bld: 63 mg/dL — ABNORMAL LOW (ref 70–99)
Potassium: <2 mmol/L — CL (ref 3.5–5.1)
Sodium: 148 mmol/L — ABNORMAL HIGH (ref 135–145)
Total Bilirubin: 0.7 mg/dL (ref 0.0–1.2)
Total Protein: 5.7 g/dL — ABNORMAL LOW (ref 6.5–8.1)

## 2024-01-03 LAB — BASIC METABOLIC PANEL WITH GFR
Anion gap: 16 — ABNORMAL HIGH (ref 5–15)
BUN: 37 mg/dL — ABNORMAL HIGH (ref 6–20)
CO2: 20 mmol/L — ABNORMAL LOW (ref 22–32)
Calcium: 7.4 mg/dL — ABNORMAL LOW (ref 8.9–10.3)
Chloride: 108 mmol/L (ref 98–111)
Creatinine, Ser: 4.69 mg/dL — ABNORMAL HIGH (ref 0.61–1.24)
GFR, Estimated: 14 mL/min — ABNORMAL LOW (ref >60)
Glucose, Bld: 118 mg/dL — ABNORMAL HIGH (ref 70–99)
Potassium: 2.1 mmol/L — CL (ref 3.5–5.1)
Sodium: 144 mmol/L (ref 135–145)

## 2024-01-03 LAB — GLUCOSE, CAPILLARY
Glucose-Capillary: 112 mg/dL — ABNORMAL HIGH (ref 70–99)
Glucose-Capillary: 126 mg/dL — ABNORMAL HIGH (ref 70–99)
Glucose-Capillary: 153 mg/dL — ABNORMAL HIGH (ref 70–99)

## 2024-01-03 LAB — RAPID URINE DRUG SCREEN, HOSP PERFORMED
Amphetamines: NOT DETECTED
Barbiturates: NOT DETECTED
Benzodiazepines: NOT DETECTED
Cocaine: POSITIVE — AB
Opiates: NOT DETECTED
Tetrahydrocannabinol: NOT DETECTED

## 2024-01-03 LAB — RESP PANEL BY RT-PCR (RSV, FLU A&B, COVID)  RVPGX2
Influenza A by PCR: NEGATIVE
Influenza B by PCR: NEGATIVE
Resp Syncytial Virus by PCR: NEGATIVE
SARS Coronavirus 2 by RT PCR: NEGATIVE

## 2024-01-03 LAB — CBG MONITORING, ED
Glucose-Capillary: 44 mg/dL — CL (ref 70–99)
Glucose-Capillary: 66 mg/dL — ABNORMAL LOW (ref 70–99)

## 2024-01-03 LAB — I-STAT CG4 LACTIC ACID, ED: Lactic Acid, Venous: 2.7 mmol/L (ref 0.5–1.9)

## 2024-01-03 LAB — TROPONIN I (HIGH SENSITIVITY)
Troponin I (High Sensitivity): 281 ng/L (ref <18)
Troponin I (High Sensitivity): 262 ng/L (ref <18)

## 2024-01-03 LAB — HIV ANTIBODY (ROUTINE TESTING W REFLEX): HIV Screen 4th Generation wRfx: NONREACTIVE

## 2024-01-03 LAB — AMMONIA: Ammonia: 55 umol/L — ABNORMAL HIGH (ref 9–35)

## 2024-01-03 LAB — HEMOGLOBIN A1C
Hgb A1c MFr Bld: 9.3 % — ABNORMAL HIGH (ref 4.8–5.6)
Mean Plasma Glucose: 220.21 mg/dL

## 2024-01-03 LAB — MRSA NEXT GEN BY PCR, NASAL: MRSA by PCR Next Gen: DETECTED — AB

## 2024-01-03 LAB — LACTIC ACID, PLASMA: Lactic Acid, Venous: 1.9 mmol/L (ref 0.5–1.9)

## 2024-01-03 LAB — ETHANOL: Alcohol, Ethyl (B): <15 mg/dL (ref <15)

## 2024-01-03 LAB — MAGNESIUM: Magnesium: 1.8 mg/dL (ref 1.7–2.4)

## 2024-01-03 MED ORDER — CHLORHEXIDINE GLUCONATE CLOTH 2 % EX PADS
6.0000 | MEDICATED_PAD | Freq: Every day | CUTANEOUS | Status: DC
Start: 2024-01-03 — End: 2024-01-03
  Administered 2024-01-03 (×2): 6 via TOPICAL

## 2024-01-03 MED ORDER — LACTATED RINGERS IV BOLUS (SEPSIS)
1000.0000 mL | Freq: Once | INTRAVENOUS | Status: DC
Start: 2024-01-03 — End: 2024-01-03

## 2024-01-03 MED ORDER — VANCOMYCIN HCL IN DEXTROSE 1-5 GM/200ML-% IV SOLN
1000.0000 mg | Freq: Once | INTRAVENOUS | Status: DC
Start: 2024-01-03 — End: 2024-01-03

## 2024-01-03 MED ORDER — SILVER SULFADIAZINE 1 % EX CREA
TOPICAL_CREAM | Freq: Every day | CUTANEOUS | Status: DC
Start: 2024-01-03 — End: 2024-01-15
  Administered 2024-01-04 – 2024-01-13 (×10): 1 via TOPICAL
  Filled 2024-01-03 (×2): qty 85

## 2024-01-03 MED ORDER — LACTATED RINGERS IV BOLUS
1000.0000 mL | Freq: Once | INTRAVENOUS | Status: AC
Start: 2024-01-03 — End: 2024-01-03
  Administered 2024-01-03 (×2): 1000 mL via INTRAVENOUS

## 2024-01-03 MED ORDER — CALCIUM GLUCONATE-NACL 2-0.675 GM/100ML-% IV SOLN
2.0000 g | Freq: Once | INTRAVENOUS | Status: AC
  Administered 2024-01-03 (×2): 2000 mg via INTRAVENOUS
  Filled 2024-01-03: qty 100

## 2024-01-03 MED ORDER — INSULIN ASPART 100 UNIT/ML IJ SOLN
0.0000 [IU] | INTRAMUSCULAR | Status: DC
  Administered 2024-01-03: 1 [IU] via SUBCUTANEOUS
  Administered 2024-01-03: 2 [IU] via SUBCUTANEOUS
  Administered 2024-01-03: 1 [IU] via SUBCUTANEOUS
  Administered 2024-01-03: 2 [IU] via SUBCUTANEOUS
  Administered 2024-01-04: 1 [IU] via SUBCUTANEOUS
  Administered 2024-01-04: 2 [IU] via SUBCUTANEOUS
  Administered 2024-01-04: 1 [IU] via SUBCUTANEOUS
  Administered 2024-01-04: 5 [IU] via SUBCUTANEOUS
  Administered 2024-01-04: 3 [IU] via SUBCUTANEOUS
  Administered 2024-01-04: 1 [IU] via SUBCUTANEOUS
  Administered 2024-01-04: 2 [IU] via SUBCUTANEOUS
  Administered 2024-01-04: 3 [IU] via SUBCUTANEOUS
  Administered 2024-01-04 (×2): 1 [IU] via SUBCUTANEOUS
  Administered 2024-01-04: 5 [IU] via SUBCUTANEOUS
  Administered 2024-01-04: 1 [IU] via SUBCUTANEOUS
  Administered 2024-01-05: 5 [IU] via SUBCUTANEOUS
  Administered 2024-01-05 (×2): 7 [IU] via SUBCUTANEOUS
  Administered 2024-01-05 (×3): 5 [IU] via SUBCUTANEOUS
  Administered 2024-01-05: 7 [IU] via SUBCUTANEOUS
  Administered 2024-01-05 (×2): 5 [IU] via SUBCUTANEOUS
  Administered 2024-01-05: 7 [IU] via SUBCUTANEOUS
  Administered 2024-01-06 (×2): 5 [IU] via SUBCUTANEOUS
  Administered 2024-01-06 (×2): 3 [IU] via SUBCUTANEOUS
  Administered 2024-01-06 (×2): 2 [IU] via SUBCUTANEOUS
  Administered 2024-01-06: 3 [IU] via SUBCUTANEOUS
  Administered 2024-01-06: 2 [IU] via SUBCUTANEOUS
  Administered 2024-01-06: 5 [IU] via SUBCUTANEOUS
  Administered 2024-01-06 (×3): 3 [IU] via SUBCUTANEOUS
  Administered 2024-01-06: 2 [IU] via SUBCUTANEOUS
  Administered 2024-01-06: 5 [IU] via SUBCUTANEOUS
  Administered 2024-01-07: 2 [IU] via SUBCUTANEOUS
  Administered 2024-01-07: 5 [IU] via SUBCUTANEOUS
  Administered 2024-01-07: 3 [IU] via SUBCUTANEOUS
  Administered 2024-01-07: 2 [IU] via SUBCUTANEOUS
  Administered 2024-01-07 (×2): 3 [IU] via SUBCUTANEOUS
  Administered 2024-01-07: 2 [IU] via SUBCUTANEOUS
  Administered 2024-01-07: 5 [IU] via SUBCUTANEOUS
  Administered 2024-01-07 (×3): 3 [IU] via SUBCUTANEOUS
  Administered 2024-01-07 – 2024-01-08 (×3): 2 [IU] via SUBCUTANEOUS
  Administered 2024-01-08 (×2): 3 [IU] via SUBCUTANEOUS
  Administered 2024-01-08: 2 [IU] via SUBCUTANEOUS
  Administered 2024-01-08: 3 [IU] via SUBCUTANEOUS
  Administered 2024-01-08 (×2): 2 [IU] via SUBCUTANEOUS
  Administered 2024-01-08 (×2): 3 [IU] via SUBCUTANEOUS
  Administered 2024-01-08: 2 [IU] via SUBCUTANEOUS
  Administered 2024-01-08: 3 [IU] via SUBCUTANEOUS
  Administered 2024-01-09 (×2): 2 [IU] via SUBCUTANEOUS
  Administered 2024-01-09: 3 [IU] via SUBCUTANEOUS
  Administered 2024-01-09 (×6): 2 [IU] via SUBCUTANEOUS
  Administered 2024-01-09: 3 [IU] via SUBCUTANEOUS
  Administered 2024-01-10 (×3): 2 [IU] via SUBCUTANEOUS
  Administered 2024-01-10: 3 [IU] via SUBCUTANEOUS
  Administered 2024-01-10: 2 [IU] via SUBCUTANEOUS
  Administered 2024-01-10: 1 [IU] via SUBCUTANEOUS
  Administered 2024-01-10: 2 [IU] via SUBCUTANEOUS
  Administered 2024-01-10: 1 [IU] via SUBCUTANEOUS
  Administered 2024-01-10 (×3): 2 [IU] via SUBCUTANEOUS
  Administered 2024-01-10: 3 [IU] via SUBCUTANEOUS
  Administered 2024-01-11 (×3): 1 [IU] via SUBCUTANEOUS
  Administered 2024-01-11: 2 [IU] via SUBCUTANEOUS
  Administered 2024-01-11: 1 [IU] via SUBCUTANEOUS
  Administered 2024-01-11: 2 [IU] via SUBCUTANEOUS
  Administered 2024-01-11 (×2): 1 [IU] via SUBCUTANEOUS
  Administered 2024-01-12 (×3): 2 [IU] via SUBCUTANEOUS
  Administered 2024-01-12 (×2): 1 [IU] via SUBCUTANEOUS
  Administered 2024-01-12 (×3): 2 [IU] via SUBCUTANEOUS
  Administered 2024-01-13 (×8): 1 [IU] via SUBCUTANEOUS
  Administered 2024-01-14: 2 [IU] via SUBCUTANEOUS
  Administered 2024-01-14: 5 [IU] via SUBCUTANEOUS
  Administered 2024-01-14: 2 [IU] via SUBCUTANEOUS
  Administered 2024-01-14: 5 [IU] via SUBCUTANEOUS
  Administered 2024-01-15: 2 [IU] via SUBCUTANEOUS
  Administered 2024-01-15: 1 [IU] via SUBCUTANEOUS
  Administered 2024-01-15 (×3): 2 [IU] via SUBCUTANEOUS
  Administered 2024-01-15: 1 [IU] via SUBCUTANEOUS

## 2024-01-03 MED ORDER — THIAMINE HCL 100 MG/ML IJ SOLN
500.0000 mg | Freq: Three times a day (TID) | INTRAVENOUS | Status: AC
  Administered 2024-01-03 – 2024-01-05 (×12): 500 mg via INTRAVENOUS
  Filled 2024-01-03: qty 2
  Filled 2024-01-03: qty 466.67
  Filled 2024-01-03: qty 450
  Filled 2024-01-03: qty 500
  Filled 2024-01-03: qty 466.67
  Filled 2024-01-03: qty 5

## 2024-01-03 MED ORDER — SUCCINYLCHOLINE CHLORIDE 20 MG/ML IJ SOLN
INTRAMUSCULAR | Status: DC | PRN
  Administered 2024-01-03 (×2): 100 mg via INTRAVENOUS

## 2024-01-03 MED ORDER — METRONIDAZOLE 500 MG/100ML IV SOLN
500.0000 mg | Freq: Once | INTRAVENOUS | Status: AC
  Administered 2024-01-03 (×2): 500 mg via INTRAVENOUS
  Filled 2024-01-03: qty 100

## 2024-01-03 MED ORDER — DOCUSATE SODIUM 50 MG/5ML PO LIQD
100.0000 mg | Freq: Two times a day (BID) | ORAL | Status: DC
  Administered 2024-01-03 – 2024-01-10 (×16): 100 mg
  Filled 2024-01-03 (×13): qty 10

## 2024-01-03 MED ORDER — FAMOTIDINE IN NACL 20-0.9 MG/50ML-% IV SOLN
20.0000 mg | Freq: Two times a day (BID) | INTRAVENOUS | Status: DC
  Administered 2024-01-03 (×4): 20 mg via INTRAVENOUS
  Filled 2024-01-03 (×2): qty 50

## 2024-01-03 MED ORDER — FENTANYL 2500MCG IN NS 250ML (10MCG/ML) PREMIX INFUSION
0.0000 ug/h | INTRAVENOUS | Status: DC
  Administered 2024-01-03 (×2): 25 ug/h via INTRAVENOUS
  Filled 2024-01-03: qty 250

## 2024-01-03 MED ORDER — SODIUM CHLORIDE 0.9 % IV SOLN: INTRAVENOUS | Status: AC | PRN

## 2024-01-03 MED ORDER — LACTATED RINGERS IV SOLN
INTRAVENOUS | Status: AC
  Filled 2024-01-03 (×5): qty 1000

## 2024-01-03 MED ORDER — IOHEXOL 350 MG/ML SOLN
75.0000 mL | Freq: Once | INTRAVENOUS | Status: AC | PRN
  Administered 2024-01-03 (×2): 75 mL via INTRAVENOUS

## 2024-01-03 MED ORDER — VANCOMYCIN HCL 2000 MG/400ML IV SOLN
2000.0000 mg | Freq: Once | INTRAVENOUS | Status: AC
  Administered 2024-01-03 (×2): 2000 mg via INTRAVENOUS
  Filled 2024-01-03: qty 400

## 2024-01-03 MED ORDER — ORAL CARE MOUTH RINSE
15.0000 mL | OROMUCOSAL | Status: DC
  Administered 2024-01-03 – 2024-01-15 (×288): 15 mL via OROMUCOSAL

## 2024-01-03 MED ORDER — DEXTROSE 50 % IV SOLN
INTRAVENOUS | Status: AC
  Administered 2024-01-03 (×2): 50 mL via INTRAVENOUS
  Filled 2024-01-03: qty 50

## 2024-01-03 MED ORDER — FENTANYL CITRATE PF 50 MCG/ML IJ SOSY
50.0000 ug | PREFILLED_SYRINGE | INTRAMUSCULAR | Status: DC | PRN
  Administered 2024-01-08 (×2): 50 ug via INTRAVENOUS
  Filled 2024-01-03: qty 1

## 2024-01-03 MED ORDER — PROPOFOL 1000 MG/100ML IV EMUL: 0.0000 ug/kg/min | INTRAVENOUS | Status: DC

## 2024-01-03 MED ORDER — DIPHENHYDRAMINE HCL 50 MG/ML IJ SOLN
12.5000 mg | Freq: Four times a day (QID) | INTRAMUSCULAR | Status: AC
  Administered 2024-01-03 – 2024-01-04 (×8): 12.5 mg via INTRAVENOUS
  Filled 2024-01-03 (×4): qty 1

## 2024-01-03 MED ORDER — ATROPINE SULFATE 1 MG/10ML IJ SOSY
PREFILLED_SYRINGE | INTRAMUSCULAR | Status: AC
  Filled 2024-01-03: qty 10

## 2024-01-03 MED ORDER — HYDRALAZINE HCL 20 MG/ML IJ SOLN
10.0000 mg | INTRAMUSCULAR | Status: DC | PRN
  Administered 2024-01-04 – 2024-01-10 (×34): 10 mg via INTRAVENOUS
  Filled 2024-01-03 (×16): qty 1

## 2024-01-03 MED ORDER — FENTANYL BOLUS VIA INFUSION: 25.0000 ug | INTRAVENOUS | Status: DC | PRN

## 2024-01-03 MED ORDER — PROPOFOL 1000 MG/100ML IV EMUL
0.0000 ug/kg/min | INTRAVENOUS | Status: DC
  Administered 2024-01-03 (×2): 15 ug/kg/min via INTRAVENOUS
  Filled 2024-01-03: qty 100

## 2024-01-03 MED ORDER — POTASSIUM CHLORIDE 10 MEQ/100ML IV SOLN
10.0000 meq | INTRAVENOUS | Status: AC
  Administered 2024-01-03 – 2024-01-04 (×20): 10 meq via INTRAVENOUS
  Filled 2024-01-03 (×10): qty 100

## 2024-01-03 MED ORDER — POLYETHYLENE GLYCOL 3350 17 G PO PACK: 17.0000 g | PACK | Freq: Every day | ORAL | Status: DC | PRN

## 2024-01-03 MED ORDER — PROPOFOL 1000 MG/100ML IV EMUL
INTRAVENOUS | Status: AC
  Administered 2024-01-03 (×2): 5 ug/kg/min via INTRAVENOUS
  Filled 2024-01-03: qty 100

## 2024-01-03 MED ORDER — LACTATED RINGERS IV BOLUS (SEPSIS)
2500.0000 mL | Freq: Once | INTRAVENOUS | Status: AC
  Administered 2024-01-03 (×2): 2500 mL via INTRAVENOUS

## 2024-01-03 MED ORDER — POTASSIUM CHLORIDE 10 MEQ/100ML IV SOLN
10.0000 meq | INTRAVENOUS | Status: AC
  Administered 2024-01-03 (×8): 10 meq via INTRAVENOUS
  Filled 2024-01-03 (×4): qty 100

## 2024-01-03 MED ORDER — DOCUSATE SODIUM 100 MG PO CAPS: 100.0000 mg | ORAL_CAPSULE | Freq: Two times a day (BID) | ORAL | Status: DC | PRN

## 2024-01-03 MED ORDER — ORAL CARE MOUTH RINSE: 15.0000 mL | OROMUCOSAL | Status: DC | PRN

## 2024-01-03 MED ORDER — ETOMIDATE 2 MG/ML IV SOLN
INTRAVENOUS | Status: DC | PRN
  Administered 2024-01-03 (×2): 20 mg via INTRAVENOUS

## 2024-01-03 MED ORDER — DEXAMETHASONE SODIUM PHOSPHATE 4 MG/ML IJ SOLN
4.0000 mg | Freq: Four times a day (QID) | INTRAMUSCULAR | Status: DC
  Administered 2024-01-03 – 2024-01-05 (×14): 4 mg via INTRAVENOUS
  Filled 2024-01-03 (×10): qty 1

## 2024-01-03 MED ORDER — FENTANYL CITRATE PF 50 MCG/ML IJ SOSY
25.0000 ug | PREFILLED_SYRINGE | Freq: Once | INTRAMUSCULAR | Status: AC
  Administered 2024-01-03 (×2): 50 ug via INTRAVENOUS
  Filled 2024-01-03: qty 1

## 2024-01-03 MED ORDER — PIPERACILLIN-TAZOBACTAM IN DEX 2-0.25 GM/50ML IV SOLN
2.2500 g | Freq: Four times a day (QID) | INTRAVENOUS | Status: DC
  Administered 2024-01-03 – 2024-01-07 (×30): 2.25 g via INTRAVENOUS
  Filled 2024-01-03 (×16): qty 50

## 2024-01-03 MED ORDER — CHLORHEXIDINE GLUCONATE CLOTH 2 % EX PADS
6.0000 | MEDICATED_PAD | Freq: Every day | CUTANEOUS | Status: AC
  Administered 2024-01-03 – 2024-01-07 (×12): 6 via TOPICAL

## 2024-01-03 MED ORDER — WHITE PETROLATUM EX OINT
TOPICAL_OINTMENT | CUTANEOUS | Status: DC | PRN
  Filled 2024-01-03: qty 28.35

## 2024-01-03 MED ORDER — POLYETHYLENE GLYCOL 3350 17 G PO PACK
17.0000 g | PACK | Freq: Every day | ORAL | Status: DC
  Administered 2024-01-03 – 2024-01-06 (×6): 17 g
  Filled 2024-01-03 (×6): qty 1

## 2024-01-03 MED ORDER — DEXTROSE 50 % IV SOLN: 1.0000 | Freq: Once | INTRAVENOUS | Status: AC

## 2024-01-03 MED ORDER — MUPIROCIN 2 % EX OINT
1.0000 | TOPICAL_OINTMENT | Freq: Two times a day (BID) | CUTANEOUS | Status: AC
  Administered 2024-01-03 – 2024-01-08 (×20): 1 via NASAL
  Filled 2024-01-03: qty 22

## 2024-01-03 MED ORDER — THIAMINE HCL 100 MG/ML IJ SOLN
100.0000 mg | INTRAMUSCULAR | Status: DC
  Administered 2024-01-05 – 2024-01-15 (×22): 100 mg via INTRAVENOUS
  Filled 2024-01-03 (×4): qty 2
  Filled 2024-01-03: qty 1
  Filled 2024-01-03 (×6): qty 2

## 2024-01-03 MED ORDER — SODIUM CHLORIDE 0.9 % IV SOLN
2.0000 g | Freq: Once | INTRAVENOUS | Status: AC
  Administered 2024-01-03 (×2): 2 g via INTRAVENOUS
  Filled 2024-01-03: qty 12.5

## 2024-01-03 MED ORDER — FENTANYL CITRATE PF 50 MCG/ML IJ SOSY
50.0000 ug | PREFILLED_SYRINGE | INTRAMUSCULAR | Status: DC | PRN
  Administered 2024-01-08 (×2): 50 ug via INTRAVENOUS
  Filled 2024-01-03: qty 2
  Filled 2024-01-03: qty 1

## 2024-01-03 MED ORDER — VANCOMYCIN VARIABLE DOSE PER UNSTABLE RENAL FUNCTION (PHARMACIST DOSING): Status: DC

## 2024-01-03 NOTE — Procedures (Signed)
 Patient Name: Steven Christensen  MRN: [id number]  Epilepsy Attending: Arlin MALVA Krebs  Referring Physician/Provider: Claudene Toribio BROCKS, MD  Date: 01/03/2024 Duration: 22.26 mins  Patient history: 53yo M with ams. EEG to evaluate for seizure  Level of alertness: lethargic/comatose  AEDs during EEG study: None  Technical aspects: This EEG study was done with scalp electrodes positioned according to the 10-20 International system of electrode placement. Electrical activity was reviewed with band pass filter of 1-70Hz , sensitivity of 7 uV/mm, display speed of 37mm/sec with a 60Hz  notched filter applied as appropriate. EEG data were recorded continuously and digitally stored.  Video monitoring was available and reviewed as appropriate.  Description: EEG showed continuous generalized 3 to 6 Hz theta-delta slowing admixed with 15 to 18 Hz beta activity distributed symmetrically and diffusely. Hyperventilation and photic stimulation were not performed.     Of note, study was technically difficult due to significant myogenic artifact  ABNORMALITY - Continuous slow, generalized  IMPRESSION: This technically difficult study is suggestive of moderate to severe diffuse encephalopathy. No seizures or epileptiform discharges were seen throughout the recording.  Lorenzo Arscott O Degan Hanser

## 2024-01-03 NOTE — Progress Notes (Signed)
 STAT EEG complete. Results pending

## 2024-01-03 NOTE — Procedures (Signed)
 Central Venous Catheter Insertion Procedure Note  Kenrick Pore  [id number]  1970-12-30  Date:01/03/24  Time:6:06 PM   Provider Performing:Jamorris Ndiaye   Procedure: Insertion of Non-tunneled Central Venous Catheter(36556) with US  guidance (23062)   Indication(s) Medication administration and Difficult access  Consent Risks of the procedure as well as the alternatives and risks of each were explained to the patient and/or caregiver.  Consent for the procedure was obtained and is signed in the bedside chart  Anesthesia Topical only with 1% lidocaine   Timeout Verified patient identification, verified procedure, site/side was marked, verified correct patient position, special equipment/implants available, medications/allergies/relevant history reviewed, required imaging and test results available.  Sterile Technique Maximal sterile technique including full sterile barrier drape, hand hygiene, sterile gown, sterile gloves, mask, hair covering, sterile ultrasound probe cover (if used).  Procedure Description Area of catheter insertion was cleaned with chlorhexidine and draped in sterile fashion.  With real-time ultrasound guidance a central venous catheter was placed into the right internal jugular vein to 18 cm depth. Nonpulsatile blood flow and easy flushing noted in all ports.  The catheter was sutured in place and sterile dressing applied.  Complications/Tolerance None; patient tolerated the procedure well. Chest X-ray is ordered to verify placement for internal jugular or subclavian cannulation.   Chest x-ray is not ordered for femoral cannulation.  EBL Minimal  Specimen(s) None

## 2024-01-03 NOTE — Progress Notes (Signed)
 Transported patient to MRI while patient was on the ventilator. Patient remained stable during transport.

## 2024-01-03 NOTE — Progress Notes (Signed)
 Patient transported to CT and back to room 23 on ventilator without complications.

## 2024-01-03 NOTE — Consult Note (Signed)
 WOC Nurse Consult Note: patient found obtunded at home with multiple pressure injuries/wounds  Reason for Consult: multiple wounds  Wound type: 1.  Deep tissue Pressure Injury sacrum/buttock  purple maroon discoloration epidermis appears to be lifting  2. R ear DTPI purple maroon discoloration with some partial thickness skin loss pink moist; DTPI forehead purple maroon discoloration 3. R arm with blistering and desquamation unknown etiology;  4.  Bilateral lower leg abrasions with dark hemorrhagic tissue  5. R knee DTPI purple maroon discoloration with some serum filled blisters  Pressure Injury POA: Yes Measurement: see nursing flowsheet  Wound bed: as above  Drainage (amount, consistency, odor) see nursing flowsheet  Periwound: some serum filled blisters to R arm  Dressing procedure/placement/frequency: Cleanse R arm with NS, apply silvadene cream to open areas daily, cover with Telfa nonstick dressing and secure lightly with Kerlix roll gauze.  Cleanse sacrum/buttocks, R knee, B lower leg wounds with NS, apply Xeroform gauze ([place] [phone]) to wound beds daily and secure with silicone foam. Cleanse R ear and forehead wounds with NS, apply Vaseline to areas daily and keep pressure off.    POC discussed with bedside nurse. Patient should remain on a low air loss mattress if moved out of ICU setting for pressure redistribution.    WOC team will not follow. Re-consult if further needs arise.   Thank you,    Powell Bar MSN, RN-BC, [employer]

## 2024-01-03 NOTE — ED Notes (Signed)
 Pt's mother reports the AMS and swelling started this morning.  Sts she checked on him yesterday and today.  Sts he was started on a new medication recently, but she doesn't know the name or what it is for.
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
## 2024-01-03 NOTE — Progress Notes (Signed)
 NAME:  Steven Christensen, MRN:  [id number], DOB:  1971-01-04, LOS: 0 ADMISSION DATE:  01/03/2024, CONSULTATION DATE:  01/03/24 REFERRING MD:  EDP, CHIEF COMPLAINT:  AMS   History of Present Illness:  53 year old man w/ hx of polysubstance abuse, associated schizoaffective disorder, recurrent hospitalizations who has had AMS for past couple days and obtunded today so family called EMS.  Obtunded and hypoglycemic on arrival.  Intubated for airway protection.  Pan scan done and PCCM consulted.  Mother did provide some additional history later in ER:  Pertinent  Medical History  Schizophrenia Polysubstance abuse- tobacco, cocaine, alcohol DM Suicide attempts  Significant Hospital Events: Including procedures, antibiotic start and stop dates in addition to other pertinent events   9/30 admit  Interim History / Subjective:  consult  Objective    Blood pressure (!) 155/98, pulse (!) 51, temperature (!) 96.9 F (36.1 C), resp. rate 18, height 6' 4 (1.93 m), weight 86.2 kg, SpO2 100%.    Vent Mode: PRVC FiO2 (%):  [100 %] 100 % Set Rate:  [20 bmp] 20 bmp Vt Set:  [309 mL] 690 mL PEEP:  [5 cmH20] 5 cmH20 Plateau Pressure:  [20 cmH20] 20 cmH20  No intake or output data in the 24 hours ending 01/03/24 1335 Filed Weights   01/03/24 1100  Weight: 86.2 kg    Examination: General: ill appearing HENT: facial swelling noted R>L, pupils pinpoint sluggish; +corneal on R, no corneal on left, no doll's eyes either side, per EMD only anterior structures affected posterior pharynx clear Lungs: rhonci bilaterally passive on vent Cardiovascular: regular, ext warm Abdomen: soft, +BS Extremities: no edema Neuro: flicker lower ext bilateral flexor; LUE withdraw to pain looks more decorticate, RUE no response to pain Skin: clear fluid filled bullae on lower forearm, one that has ruptured on upper air, no proximal tracking and not located anywhere else: see media Overall diffuse  anasarca  Labs/imaging reviewed   Resolved problem list   Assessment and Plan  Acute encephalopathy- his neuro exam is most concerning part of presentation, nearly obtunded; CT by my read with some grey-white matter differentiation loss but not called on read.  Was down but timeline for this unclear.  UDS + cocaine.  R/o status epilepticus, r/o anoxic brain injury, r/o substance overdose Acute hypoxemic respiratory failure- with LLLL aspiration on CT and R pleural effusion that looks chronic RUE blistering- does not look c/w SJS, no obvious other signs of burns, distal pulses intact, no signs of involvement below dermis, does not even really look infectious if anything wonder if its just a pressure injury?  Unclear on downtime and EMS details are sketchy. Facial swelling r/o angioedema- looks like new meds dispensed this week include losartan, hydrochlorothiazide, furosemide, and gabapentin.  Facial swelling and desquamation of just RUE doesn't really make sense for a drug reaction syndrome; can just hold all meds and give allergy medications just in case.   Acute renal failure with hypokalemia- some reported vomiting per mother  - Replete K - Probably needs CVL due to poor access and multiple abx - WOC consult for RUE probably can do silvadene or similar - EEG then MRI - Empiric thiamine repletion - Empiric broad spectrum abx - Shared my concern with mother regarding degree of coma, will see what MRI shows, may need to have neurology and/or palliative see depending on results  Labs   CBC: Recent Labs  Lab 01/03/24 1119 01/03/24 1137  WBC 19.6*  --   NEUTROABS  15.7*  --   HGB 14.7 12.6*  HCT 43.7 37.0*  MCV 90.9  --   PLT 335  --     Basic Metabolic Panel: Recent Labs  Lab 01/03/24 1119 01/03/24 1137  NA 148* 146*  K <2.0* <2.0*  CL 111  --   CO2 20*  --   GLUCOSE 63*  --   BUN 34*  --   CREATININE 5.11*  --   CALCIUM 7.8*  --    GFR: Estimated Creatinine Clearance:  19.9 mL/min (A) (by C-G formula based on SCr of 5.11 mg/dL (H)). Recent Labs  Lab 01/03/24 1119 01/03/24 1130  WBC 19.6*  --   LATICACIDVEN  --  2.7*    Liver Function Tests: Recent Labs  Lab 01/03/24 1119  AST 45*  ALT 22  ALKPHOS 87  BILITOT 0.7  PROT 5.7*  ALBUMIN 1.8*   No results for input(s): LIPASE, AMYLASE in the last 168 hours. Recent Labs  Lab 01/03/24 1203  AMMONIA 55*    ABG    Component Value Date/Time   PHART 7.247 (L) 01/03/2024 1137   PCO2ART 55.3 (H) 01/03/2024 1137   PO2ART 280 (H) 01/03/2024 1137   HCO3 24.1 01/03/2024 1137   TCO2 26 01/03/2024 1137   ACIDBASEDEF 4.0 (H) 01/03/2024 1137   O2SAT 100 01/03/2024 1137     Coagulation Profile: No results for input(s): INR, PROTIME in the last 168 hours.  Cardiac Enzymes: Recent Labs  Lab 01/03/24 1119  CKTOTAL 1,475*  CKMB 32.2*    HbA1C: No results found for: HGBA1C  CBG: Recent Labs  Lab 01/03/24 1115 01/03/24 1116  GLUCAP 44* 66*    Review of Systems:   Comatose  Past Medical History:  He,  has no past medical history on file.   Surgical History:  History reviewed. No pertinent surgical history.   Social History:      Family History:  His family history is not on file.   Allergies Not on File   Home Medications  Prior to Admission medications   Not on File     Critical care time: 34 mins

## 2024-01-03 NOTE — ED Notes (Signed)
 Patient transported to CT

## 2024-01-03 NOTE — Progress Notes (Signed)
 Pharmacy Antibiotic Note  Steven Christensen is a 53 y.o. male for which pharmacy has been consulted for vancomycin and zosyn dosing for pneumonia and bacteremia.  Patient with a history of substance abuse, schizoaffective disorder. Patient presenting with AMS.  SCr 5.11 - AKI WBC 19.6; LA 2.7; T 96.7; HR 54; RR 20 COVID neg / flu neg  Cefepime and flagyl given in the ED x 1  Plan: Zosyn 2.25g q6h Vancomycin 2000 mg once, subsequent dosing as indicated per random vancomycin level until renal function stable and/or improved, at which time scheduled dosing can be considered Monitor WBC, fever, renal function, cultures De-escalate when able  Height: 6' 4 (193 cm) Weight: 86.2 kg (190 lb) IBW/kg (Calculated) : 86.8  Temp (24hrs), Avg:96.8 F (36 C), Min:95.5 F (35.3 C), Max:97.4 F (36.3 C)  Recent Labs  Lab 01/03/24 1119 01/03/24 1130  WBC 19.6*  --   CREATININE 5.11*  --   LATICACIDVEN  --  2.7*    Estimated Creatinine Clearance: 19.9 mL/min (A) (by C-G formula based on SCr of 5.11 mg/dL (H)).    Not on File  Microbiology results: Pending  Thank you for allowing pharmacy to be a part of this patient's care.  Dorn Buttner, PharmD, BCPS 01/03/2024 2:06 PM ED Clinical Pharmacist -  [phone]

## 2024-01-03 NOTE — ED Notes (Signed)
 Temp foley inserted w/ Dr. Kandee verbal order.

## 2024-01-03 NOTE — H&P (View-Only) (Signed)
 NAME:  Steven Christensen, MRN:  [id number], DOB:  1971-01-04, LOS: 0 ADMISSION DATE:  01/03/2024, CONSULTATION DATE:  01/03/24 REFERRING MD:  EDP, CHIEF COMPLAINT:  AMS   History of Present Illness:  53 year old man w/ hx of polysubstance abuse, associated schizoaffective disorder, recurrent hospitalizations who has had AMS for past couple days and obtunded today so family called EMS.  Obtunded and hypoglycemic on arrival.  Intubated for airway protection.  Pan scan done and PCCM consulted.  Mother did provide some additional history later in ER:  Pertinent  Medical History  Schizophrenia Polysubstance abuse- tobacco, cocaine, alcohol DM Suicide attempts  Significant Hospital Events: Including procedures, antibiotic start and stop dates in addition to other pertinent events   9/30 admit  Interim History / Subjective:  consult  Objective    Blood pressure (!) 155/98, pulse (!) 51, temperature (!) 96.9 F (36.1 C), resp. rate 18, height 6' 4 (1.93 m), weight 86.2 kg, SpO2 100%.    Vent Mode: PRVC FiO2 (%):  [100 %] 100 % Set Rate:  [20 bmp] 20 bmp Vt Set:  [309 mL] 690 mL PEEP:  [5 cmH20] 5 cmH20 Plateau Pressure:  [20 cmH20] 20 cmH20  No intake or output data in the 24 hours ending 01/03/24 1335 Filed Weights   01/03/24 1100  Weight: 86.2 kg    Examination: General: ill appearing HENT: facial swelling noted R>L, pupils pinpoint sluggish; +corneal on R, no corneal on left, no doll's eyes either side, per EMD only anterior structures affected posterior pharynx clear Lungs: rhonci bilaterally passive on vent Cardiovascular: regular, ext warm Abdomen: soft, +BS Extremities: no edema Neuro: flicker lower ext bilateral flexor; LUE withdraw to pain looks more decorticate, RUE no response to pain Skin: clear fluid filled bullae on lower forearm, one that has ruptured on upper air, no proximal tracking and not located anywhere else: see media Overall diffuse  anasarca  Labs/imaging reviewed   Resolved problem list   Assessment and Plan  Acute encephalopathy- his neuro exam is most concerning part of presentation, nearly obtunded; CT by my read with some grey-white matter differentiation loss but not called on read.  Was down but timeline for this unclear.  UDS + cocaine.  R/o status epilepticus, r/o anoxic brain injury, r/o substance overdose Acute hypoxemic respiratory failure- with LLLL aspiration on CT and R pleural effusion that looks chronic RUE blistering- does not look c/w SJS, no obvious other signs of burns, distal pulses intact, no signs of involvement below dermis, does not even really look infectious if anything wonder if its just a pressure injury?  Unclear on downtime and EMS details are sketchy. Facial swelling r/o angioedema- looks like new meds dispensed this week include losartan, hydrochlorothiazide, furosemide, and gabapentin.  Facial swelling and desquamation of just RUE doesn't really make sense for a drug reaction syndrome; can just hold all meds and give allergy medications just in case.   Acute renal failure with hypokalemia- some reported vomiting per mother  - Replete K - Probably needs CVL due to poor access and multiple abx - WOC consult for RUE probably can do silvadene or similar - EEG then MRI - Empiric thiamine repletion - Empiric broad spectrum abx - Shared my concern with mother regarding degree of coma, will see what MRI shows, may need to have neurology and/or palliative see depending on results  Labs   CBC: Recent Labs  Lab 01/03/24 1119 01/03/24 1137  WBC 19.6*  --   NEUTROABS  15.7*  --   HGB 14.7 12.6*  HCT 43.7 37.0*  MCV 90.9  --   PLT 335  --     Basic Metabolic Panel: Recent Labs  Lab 01/03/24 1119 01/03/24 1137  NA 148* 146*  K <2.0* <2.0*  CL 111  --   CO2 20*  --   GLUCOSE 63*  --   BUN 34*  --   CREATININE 5.11*  --   CALCIUM 7.8*  --    GFR: Estimated Creatinine Clearance:  19.9 mL/min (A) (by C-G formula based on SCr of 5.11 mg/dL (H)). Recent Labs  Lab 01/03/24 1119 01/03/24 1130  WBC 19.6*  --   LATICACIDVEN  --  2.7*    Liver Function Tests: Recent Labs  Lab 01/03/24 1119  AST 45*  ALT 22  ALKPHOS 87  BILITOT 0.7  PROT 5.7*  ALBUMIN 1.8*   No results for input(s): LIPASE, AMYLASE in the last 168 hours. Recent Labs  Lab 01/03/24 1203  AMMONIA 55*    ABG    Component Value Date/Time   PHART 7.247 (L) 01/03/2024 1137   PCO2ART 55.3 (H) 01/03/2024 1137   PO2ART 280 (H) 01/03/2024 1137   HCO3 24.1 01/03/2024 1137   TCO2 26 01/03/2024 1137   ACIDBASEDEF 4.0 (H) 01/03/2024 1137   O2SAT 100 01/03/2024 1137     Coagulation Profile: No results for input(s): INR, PROTIME in the last 168 hours.  Cardiac Enzymes: Recent Labs  Lab 01/03/24 1119  CKTOTAL 1,475*  CKMB 32.2*    HbA1C: No results found for: HGBA1C  CBG: Recent Labs  Lab 01/03/24 1115 01/03/24 1116  GLUCAP 44* 66*    Review of Systems:   Comatose  Past Medical History:  He,  has no past medical history on file.   Surgical History:  History reviewed. No pertinent surgical history.   Social History:      Family History:  His family history is not on file.   Allergies Not on File   Home Medications  Prior to Admission medications   Not on File     Critical care time: 34 mins

## 2024-01-03 NOTE — ED Notes (Signed)
 CCMD called to report transfer to RM 2M02

## 2024-01-03 NOTE — Progress Notes (Signed)
 Pt being moved to 15m11

## 2024-01-03 NOTE — Progress Notes (Addendum)
 eLink Physician-Brief Progress Note Patient Name: Steven Christensen DOB: Mar 14, 1971 MRN: [id number]   Date of Service  01/03/2024  HPI/Events of Note  potassium 2.1, cret 4.69, GFR 14  trop 262  eICU Interventions  Kcl  Discontinue trop trend   2326 - hypertenisve SBP 170-180s with HR 38-50s, Hydralazine for goal SBP < 165  0149 -65 cc urine output for the shift.  Net +3.8 L for the day.  No intervention for now.  Continue observation  0604 - K+ 2.7, Mag 1.7, Crt 5.09, Infusing 8th run of K+, 2 more left to infuse.  Add enteral KCl also  Intervention Category Minor Interventions: Routine modifications to care plan (e.g. PRN medications for pain, fever)  Fenton Candee 01/03/2024, 8:24 PM

## 2024-01-03 NOTE — ED Notes (Signed)
 Only able to obtain 1 set of blood cultures. EDP aware.

## 2024-01-03 NOTE — Progress Notes (Signed)
 Patient transported from ED to room 3M11 on ventilator without complications.
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
## 2024-01-04 DIAGNOSIS — R21 Rash and other nonspecific skin eruption: Secondary | ICD-10-CM

## 2024-01-04 DIAGNOSIS — E11649 Type 2 diabetes mellitus with hypoglycemia without coma: Secondary | ICD-10-CM

## 2024-01-04 DIAGNOSIS — J9601 Acute respiratory failure with hypoxia: Secondary | ICD-10-CM | POA: Diagnosis not present

## 2024-01-04 DIAGNOSIS — G9341 Metabolic encephalopathy: Secondary | ICD-10-CM | POA: Diagnosis not present

## 2024-01-04 DIAGNOSIS — A419 Sepsis, unspecified organism: Secondary | ICD-10-CM | POA: Diagnosis not present

## 2024-01-04 DIAGNOSIS — J690 Pneumonitis due to inhalation of food and vomit: Secondary | ICD-10-CM | POA: Diagnosis not present

## 2024-01-04 DIAGNOSIS — R7989 Other specified abnormal findings of blood chemistry: Secondary | ICD-10-CM

## 2024-01-04 LAB — BASIC METABOLIC PANEL WITH GFR
Anion gap: 16 — ABNORMAL HIGH (ref 5–15)
Anion gap: 16 — ABNORMAL HIGH (ref 5–15)
Anion gap: 15 (ref 5–15)
BUN: 41 mg/dL — ABNORMAL HIGH (ref 6–20)
BUN: 44 mg/dL — ABNORMAL HIGH (ref 6–20)
BUN: 47 mg/dL — ABNORMAL HIGH (ref 6–20)
CO2: 19 mmol/L — ABNORMAL LOW (ref 22–32)
CO2: 18 mmol/L — ABNORMAL LOW (ref 22–32)
CO2: 17 mmol/L — ABNORMAL LOW (ref 22–32)
Calcium: 7.4 mg/dL — ABNORMAL LOW (ref 8.9–10.3)
Calcium: 7.3 mg/dL — ABNORMAL LOW (ref 8.9–10.3)
Calcium: 7.2 mg/dL — ABNORMAL LOW (ref 8.9–10.3)
Chloride: 110 mmol/L (ref 98–111)
Chloride: 111 mmol/L (ref 98–111)
Chloride: 111 mmol/L (ref 98–111)
Creatinine, Ser: 5.09 mg/dL — ABNORMAL HIGH (ref 0.61–1.24)
Creatinine, Ser: 5.38 mg/dL — ABNORMAL HIGH (ref 0.61–1.24)
Creatinine, Ser: 5.35 mg/dL — ABNORMAL HIGH (ref 0.61–1.24)
GFR, Estimated: 13 mL/min — ABNORMAL LOW (ref >60)
GFR, Estimated: 12 mL/min — ABNORMAL LOW (ref >60)
GFR, Estimated: 12 mL/min — ABNORMAL LOW (ref >60)
Glucose, Bld: 126 mg/dL — ABNORMAL HIGH (ref 70–99)
Glucose, Bld: 181 mg/dL — ABNORMAL HIGH (ref 70–99)
Glucose, Bld: 236 mg/dL — ABNORMAL HIGH (ref 70–99)
Potassium: 2.7 mmol/L — CL (ref 3.5–5.1)
Potassium: 3.3 mmol/L — ABNORMAL LOW (ref 3.5–5.1)
Potassium: 3.2 mmol/L — ABNORMAL LOW (ref 3.5–5.1)
Sodium: 145 mmol/L (ref 135–145)
Sodium: 145 mmol/L (ref 135–145)
Sodium: 143 mmol/L (ref 135–145)

## 2024-01-04 LAB — CBC
HCT: 38.8 % — ABNORMAL LOW (ref 39.0–52.0)
Hemoglobin: 13.4 g/dL (ref 13.0–17.0)
MCH: 30.5 pg (ref 26.0–34.0)
MCHC: 34.5 g/dL (ref 30.0–36.0)
MCV: 88.2 fL (ref 80.0–100.0)
Platelets: 281 K/uL (ref 150–400)
RBC: 4.4 MIL/uL (ref 4.22–5.81)
RDW: 15.7 % — ABNORMAL HIGH (ref 11.5–15.5)
WBC: 17.7 K/uL — ABNORMAL HIGH (ref 4.0–10.5)
nRBC: 0 % (ref 0.0–0.2)

## 2024-01-04 LAB — POCT I-STAT EG7
Acid-base deficit: 5 mmol/L — ABNORMAL HIGH (ref 0.0–2.0)
Bicarbonate: 19 mmol/L — ABNORMAL LOW (ref 20.0–28.0)
Calcium, Ion: 1.06 mmol/L — ABNORMAL LOW (ref 1.15–1.40)
HCT: 32 % — ABNORMAL LOW (ref 39.0–52.0)
Hemoglobin: 10.9 g/dL — ABNORMAL LOW (ref 13.0–17.0)
O2 Saturation: 79 %
Patient temperature: 36.7
Potassium: 3.1 mmol/L — ABNORMAL LOW (ref 3.5–5.1)
Sodium: 145 mmol/L (ref 135–145)
TCO2: 20 mmol/L — ABNORMAL LOW (ref 22–32)
pCO2, Ven: 29.4 mmHg — ABNORMAL LOW (ref 44–60)
pH, Ven: 7.416 (ref 7.25–7.43)
pO2, Ven: 41 mmHg (ref 32–45)

## 2024-01-04 LAB — URINALYSIS, ROUTINE W REFLEX MICROSCOPIC
Bilirubin Urine: NEGATIVE
Glucose, UA: 50 mg/dL — AB
Ketones, ur: NEGATIVE mg/dL
Leukocytes,Ua: NEGATIVE
Nitrite: NEGATIVE
Protein, ur: >=300 mg/dL — AB
Specific Gravity, Urine: 1.016 (ref 1.005–1.030)
pH: 5 (ref 5.0–8.0)

## 2024-01-04 LAB — CK: Total CK: 1717 U/L — ABNORMAL HIGH (ref 49–397)

## 2024-01-04 LAB — MAGNESIUM
Magnesium: 1.7 mg/dL (ref 1.7–2.4)
Magnesium: 2 mg/dL (ref 1.7–2.4)

## 2024-01-04 LAB — GLUCOSE, CAPILLARY
Glucose-Capillary: 126 mg/dL — ABNORMAL HIGH (ref 70–99)
Glucose-Capillary: 126 mg/dL — ABNORMAL HIGH (ref 70–99)
Glucose-Capillary: 146 mg/dL — ABNORMAL HIGH (ref 70–99)
Glucose-Capillary: 173 mg/dL — ABNORMAL HIGH (ref 70–99)
Glucose-Capillary: 233 mg/dL — ABNORMAL HIGH (ref 70–99)
Glucose-Capillary: 255 mg/dL — ABNORMAL HIGH (ref 70–99)

## 2024-01-04 LAB — TRIGLYCERIDES: Triglycerides: 95 mg/dL (ref <150)

## 2024-01-04 LAB — PHOSPHORUS: Phosphorus: 6.9 mg/dL — ABNORMAL HIGH (ref 2.5–4.6)

## 2024-01-04 MED ORDER — POTASSIUM CHLORIDE 20 MEQ PO PACK
40.0000 meq | PACK | Freq: Once | ORAL | Status: AC
  Administered 2024-01-04 (×2): 40 meq
  Filled 2024-01-04: qty 2

## 2024-01-04 MED ORDER — LINEZOLID 600 MG/300ML IV SOLN: 600.0000 mg | Freq: Two times a day (BID) | INTRAVENOUS | Status: DC

## 2024-01-04 MED ORDER — CALCIUM GLUCONATE-NACL 1-0.675 GM/50ML-% IV SOLN
1.0000 g | Freq: Once | INTRAVENOUS | Status: AC
  Administered 2024-01-04 (×2): 1000 mg via INTRAVENOUS
  Filled 2024-01-04: qty 50

## 2024-01-04 MED ORDER — HEPARIN SODIUM (PORCINE) 5000 UNIT/ML IJ SOLN
5000.0000 [IU] | Freq: Three times a day (TID) | INTRAMUSCULAR | Status: DC
  Administered 2024-01-04 – 2024-01-15 (×68): 5000 [IU] via SUBCUTANEOUS
  Filled 2024-01-04 (×34): qty 1

## 2024-01-04 MED ORDER — POTASSIUM CHLORIDE 20 MEQ PO PACK
40.0000 meq | PACK | ORAL | Status: AC
  Administered 2024-01-04 (×4): 40 meq
  Filled 2024-01-04 (×2): qty 2

## 2024-01-04 MED ORDER — ALBUMIN HUMAN 5 % IV SOLN
25.0000 g | Freq: Four times a day (QID) | INTRAVENOUS | Status: AC
  Administered 2024-01-04 – 2024-01-05 (×8): 25 g via INTRAVENOUS
  Filled 2024-01-04 (×4): qty 500

## 2024-01-04 MED ORDER — OSMOLITE 1.5 CAL PO LIQD
1000.0000 mL | ORAL | Status: DC
  Administered 2024-01-04 (×2): 1000 mL
  Filled 2024-01-04: qty 1000

## 2024-01-04 MED ORDER — MAGNESIUM SULFATE 2 GM/50ML IV SOLN
2.0000 g | Freq: Once | INTRAVENOUS | Status: AC
  Administered 2024-01-04 (×2): 2 g via INTRAVENOUS
  Filled 2024-01-04: qty 50

## 2024-01-04 MED ORDER — PANTOPRAZOLE SODIUM 40 MG IV SOLR
40.0000 mg | INTRAVENOUS | Status: DC
  Administered 2024-01-04 – 2024-01-15 (×24): 40 mg via INTRAVENOUS
  Filled 2024-01-04 (×12): qty 10

## 2024-01-04 MED ORDER — VITAL HP 1.0 CAL PO LIQD
1000.0000 mL | ORAL | Status: DC
  Filled 2024-01-04: qty 1000

## 2024-01-04 NOTE — Progress Notes (Signed)
 Initial Nutrition Assessment  DOCUMENTATION CODES:   Not applicable  INTERVENTION:  Initiate trickle tube feeding via OGT: Osmolite 1.5 at 20ml/hr  Suspect to be at elevated risk for refeeding syndrome, recommend monitoring potassium, magnesium and phosphorus daily for at least 4 days. Continue Thiamine as ordered in the setting of polysubstance use.  Pending ability to advance tube feeding, recommend: Advancing Osmolite 1.5 by 10ml q8h to goal rate of 59ml/hr ( daily) 60ml ProSource TF20 BID Provides 2500 kcal, 138g protein, free water daily  Once appropriate for tube feed advancement, recommend adding MVI with minerals daily and Juven BID.   NUTRITION DIAGNOSIS:   Inadequate oral intake related to acute illness as evidenced by NPO status.  GOAL:   Patient will meet greater than or equal to 90% of their needs  MONITOR:   Vent status, Labs, Weight trends, TF tolerance, Skin  REASON FOR ASSESSMENT:   Ventilator, Consult Enteral/tube feeding initiation and management (trickle tube feeds only)  ASSESSMENT:   Pt admitted with AMS, emesis and hypoglycemia. PMH significant for polysubstance use, associated schizoaffective disorder and diabetes.  9/30- admitted w/AMS; intubated; MRI concern for evolving ABI  Patient is currently intubated on ventilator support MV: 13.9 L/min Temp (24hrs), Avg:97.3 F (36.3 C), Min:95.5 F (35.3 C), Max:98.4 F (36.9 C)  RD working remotely.  Pt intubated. Unable to obtain nutrition related history at this time.  Per review of H&P, pt lives with his mom, works in Aeronautical engineer and is non-compliant with most of his medications.  Last consumed beer on 9/27 but not a consistent daily drinker.   Admit weight: 105.5 kg Current weight: 110.8 kg Suspect admit and current weight to be elevated from true baseline/dry weight as pt documented to have significant edema. + moderate pitting generalized, deep pitting RUE, moderate pitting  LUE, RLE, LLE, non-pitting facial and mild pitting perineal edema  Per review of pt's primary chart, his last measured weight was 86.2 kg in April 2025. Given unknown current dry weight and inability to visually observe pt, will utilize his ideal body weight for estimated nutrition needs and reassess upon follow up.   Drains/lines: OGT (tip not visualized per imaging, passes the region of GE junction) Right internal jugular triple lumen  Medications: decadron, colace BID, SSI 0-9 units q4h, miralax daily, klor-con, thiamine Drips: LR @ 131ml/hr Abx  Labs: Potassium 3.1 BUN 41 Cr 5.09 Anion gap 16 Ionized calcium 1.06 Phosphorus 6.9 GFR 13 CBG's 44-153 x24 hours (hypoglycemic much improved from admit) HgbA1c 9.3%   NUTRITION - FOCUSED PHYSICAL EXAM: RD working remotely. Deferred to in person assessment.   Diet Order:   Diet Order             Diet NPO time specified  Diet effective now                   EDUCATION NEEDS:   No education needs have been identified at this time  Skin:  Skin Assessment: Skin Integrity Issues: Skin Integrity Issues:: Stage I, Other (Comment), DTI DTI: right ear, right knee, right upper head Stage I: right buttock Other: open blisters to right arm  Last BM:  PTA  Height:   Ht Readings from Last 1 Encounters:  01/03/24 6' 4 (1.93 m)    Weight:   Wt Readings from Last 1 Encounters:  01/04/24 110.8 kg    Ideal Body Weight:  91.8 kg  BMI:  Body mass index is 29.73 kg/m.  Estimated Nutritional Needs:  Kcal:  2300-2500  Protein:  120-135g  Fluid:  >/=2L  Steven Christensen, RDN, LDN Clinical Nutrition See AMiON for contact information.

## 2024-01-04 NOTE — Progress Notes (Signed)
 Pharmacy Antibiotic Note  Steven Christensen is a 53 y.o. male for which pharmacy has been consulted for zosyn dosing for pneumonia and bacteremia.  Patient with a history of substance abuse, schizoaffective disorder. Patient presenting with AMS. Vancomycin is being transitioned to linezolid d/t AKI and unreliable dialysis needs. Due to one vancomycin load level, linezolid does not need to be started for at least 24 hours.   SCr 5.09 - AKI WBC 17.7; LA 1.9; T 98.1; HR 49; RR 20 COVID neg / flu neg  Cefepime and flagyl given in the ED x 1  Plan: Continue Zosyn 2.25g q6h Vancomycin discontinued. Vancomycin random level 10/2 AM. Transition to linezolid pending vancomycin level.   Monitor WBC, fever, renal function, cultures De-escalate when able  Height: 6' 4 (193 cm) Weight: 110.8 kg (244 lb 4.3 oz) IBW/kg (Calculated) : 86.8  Temp (24hrs), Avg:97.2 F (36.2 C), Min:95.5 F (35.3 C), Max:98.4 F (36.9 C)  Recent Labs  Lab 01/03/24 1119 01/03/24 1130 01/03/24 1640 01/03/24 1837 01/04/24 0446  WBC 19.6*  --   --   --  17.7*  CREATININE 5.11*  --   --  4.69* 5.09*  LATICACIDVEN  --  2.7* 1.9  --   --     Estimated Creatinine Clearance: 22.4 mL/min (A) (by C-G formula based on SCr of 5.09 mg/dL (H)).    No Known Allergies  Microbiology results: MRSA PCR - detected 9/30 Bcx - NGTD < 24H 9/30 Respiratory culture - pending  Thank you for allowing pharmacy to be a part of this patient's care.  Administrator, arts Student Pharmacist  01/04/2024,9:19 AM

## 2024-01-04 NOTE — TOC CM/SW Note (Signed)
 Transition of Care [facility]) - Inpatient Brief Assessment   Patient Details  Name: Steven Christensen MRN: [id number] Date of Birth: [birth year]  Transition of Care [facility]) CM/SW Contact:    Tom-Johnson, Harvest Muskrat, RN Phone Number: 01/04/2024, 3:01 PM   Clinical Narrative:  Patient presented to the ED after family called EMS with Altered Mental Status and Obtunded for couple of days. Patient intubated for Airway protection. Has hx of Schizoaffective Disorder, Polysubstance abuse and recurrent Hospitalizations. EEG to evaluate for Seizure. MRI findings consistent with evolving Anoxic Brain Injury. Neurology following.  No ICM needs or recommendations noted at this time. CM will continue to follow.      Transition of Care Asessment:

## 2024-01-04 NOTE — IPAL (Signed)
  Interdisciplinary Goals of Care Family Meeting   Date carried out: 01/04/2024  Location of the meeting: Bedside  Member's involved: Nurse Practitioner and Family Member or next of kin  Durable Power of Attorney or acting medical decision maker: mother, uncle also present for support of mother    Discussion: We discussed goals of care for [employer] .  Reviewed findings of MRI and clinical exam c/w with severe anoxic injury in addition to MODS.  She expresses concerns that pt has had underlying progressive health problems for a while but not shared them and very paranoid at baseline.  She tried to get him to seek medical care for awhile but he has refused.  Shares that they have had previous discussions and knows he would not want to in a facility or dependent on others or prolonged life support and that he wants to be cremated. At this time, mother in agreement that he were to decline we would not perform CPR as this would provide any benefit.  Plans to continue care at this time and see how the next 24-48hrs goes and make further decisions then. Comfort and one way palliative extubation briefly discussed.  Aware of high risk for further decompensation, in which we should reassess GOC sooner.  Ongoing support provided, chaplin offered.       Code status:   Code Status: Full Code > DNR, no CPR  Disposition: Continue current acute care  Time spent for the meeting: 20 mins    Lyle Pesa, NP  01/04/2024, 12:57 PM

## 2024-01-04 NOTE — Progress Notes (Signed)
 Ok to add heparin 5000 units SQ q8 for DVT px per Lyle Pesa.  Sergio Batch, PharmD, BCIDP, AAHIVP, CPP Infectious Disease Pharmacist 01/04/2024 11:29 AM

## 2024-01-04 NOTE — Progress Notes (Signed)
 NAME:  Steven Christensen, MRN:  [id number], DOB:  1970/11/20, LOS: 1 ADMISSION DATE:  01/03/2024, CONSULTATION DATE:  01/03/24 REFERRING MD:  EDP, CHIEF COMPLAINT:  AMS   History of Present Illness:  53 year old man w/ hx of polysubstance abuse, associated schizoaffective disorder, and DM with not feeling well since 9/28 with vomiting with progressive AMS.  Found obtunded by EMS and hypoglycemic.  Intubated for airway protection.  AKI, hypokalemic, +cocaine.  Pan scan done and PCCM consulted.  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  Non compliant with most of his meds  Pertinent  Medical History  Schizophrenia Polysubstance abuse- tobacco, cocaine, alcohol DM Suicide attempts  Significant Hospital Events: Including procedures, antibiotic start and stop dates in addition to other pertinent events   9/30 admit, intubated   Interim History / Subjective:  MRI brain overnight findings c/w evolving anoxic brain injury Off sedation since MRI, remains unresponsive, intermittent LUE flexor posturing overnight HR 30-50's overnight, hypertensive requiring hydralazine  Objective    Blood pressure (!) 145/130, pulse 64, temperature 98.1 F (36.7 C), resp. rate 20, height 6' 4 (1.93 m), weight 110.8 kg, SpO2 100%.    Vent Mode: PRVC FiO2 (%):  [40 %-100 %] 40 % Set Rate:  [20 bmp] 20 bmp Vt Set:  [690 mL] 690 mL PEEP:  [5 cmH20] 5 cmH20 Plateau Pressure:  [19 cmH20-21 cmH20] 21 cmH20   Intake/Output Summary (Last 24 hours) at 01/04/2024 0659 Last data filed at 01/04/2024 0000 Gross per 24 hour  Intake 4185.63 ml  Output 325 ml  Net 3860.63 ml   Filed Weights   01/03/24 1100 01/03/24 1537 01/04/24 0450  Weight: 86.2 kg 105.5 kg 110.8 kg    Examination: Off sedation General:  critically ill appearing adult male lying in bed in NAD HEENT: MM pink/moist, pupils 2/NR, upper ward/ disconjugate gaze, R  corneal +, left absent, ETT/ OGT, slightly improved facial swelling Neuro:  rhythmic mouth movements with stimulation, spont intermittent LUE flexion posturing otherwise non responsive, no gag CV: rr, SB, no murmur, R internal jugular CVL PULM:  MV supported, not over breathing set rate, coarse, scant secretions, no wheeze, delayed deep cough GI: soft, hypoBS, ND, foley- hazy yellow urine Extremities: warm/dry, diffuse pitting anasarca, RUE with dressing, slight weeping   Afebrile, initially requiring bair hugger  Labs> WBC 17.7, K 2.7, Mag1.7, bicarb 19, BUN/ sCr 37/4.69> 41/ 5.09 UOP > not charted overnight but reported ~72ml Net +5.4L  MRI brain 10/1> 1. Findings consistent with evolving anoxic brain injury as detailed above. 2. Probable soft tissue contusions about the right frontal and parietal scalp, with diffuse scalp edema elsewhere. 3. No other acute intracranial abnormality.   Resolved problem list   Assessment and Plan   Acute anoxic/ metabolic/ toxic encephalopathy with suspected myoclonus  Hx polysubstance abuse - unclear inciting event, found hypoglycemic, +cocaine - last beer 9/27 per mother but not consistent daily drinker - MRI brain c/w evolving anoxic injury - EEG 9/30 suggestive of mod to severe diffuse encephalopathy, no seizures/ epileptiform P:  - cont neuro exams - maintain neuro protective measures; goal for eurothermia, euglycemia, eunatermia, normoxia, and PCO2 goal of 35-40 - Seizure precautions  - currently not requiring sedation - cont empiric high dose thiamine  - ongoing GOC, poor overall prognosis  Acute hypoxemic respiratory failure Aspiration PNA R >L pleural effusion P:  - cont full MV support -  VAP/ PPI  - PAD protocol for sedation> prn propofol> currently not needed - intermittent CXR/ ABG - FiO2 goal SpO2 >92% - can SBT but mental status is barrier for extubation  - prn BD - follow trach asp, abx as below > likely switch to  linezolid with AKI given MRSA PCR +  Sepsis 2/2 Aspiration PNA  - remains hypertensive at times, otherwise SBP/ MAP stable, goal > 65.   - empiric broad spectrum abx for now - add on UA/ UC if needed.  Less likely but wounds could be source although suspected pressure injury - follow pan cultures  - trend WBC/ fever curve   RUE blistering- suspect related to pressure injury in way pt was found laying, less likely SJS, no obvious other signs of burns, distal pulses intact, no signs of involvement below dermis, less likely infectious Facial swelling r/o angioedema- improving, looks like new meds dispensed this week include losartan, hydrochlorothiazide, furosemide, and gabapentin.  Facial swelling and desquamation of just RUE doesn't really make sense for a drug reaction syndrome; suspect also related to fluid shifts with way pt was laying/ found down - CT RUE neg for gas, stranding, or fascial thickening P:  - WOC consult, supportive care - empiric abx as above  Acute renal failure - oliguric, suspect ATN AGMA Hypokalemia Hypomagnesemia  Elevated CK - no hydronephrosis or obstruction on CT P:  - add on UA - recheck CK, BMET q12 - aggressive K/ Mag replete and repeat on PM labs - trend renal indices  - strict I/Os, daily wts - avoid nephrotoxins, renal dose meds, hemodynamic support as above - given anoxic encephalopathy, unfortunately not a candidate for dialysis if progressive renal failure  DMT2 Hypoglycemia - A1c 9.3 - CBG's have been stable, cont trend q4, prn SSI goal 140-180 - add trickle feeds, advance as tolerated   Elevated troponins Prolonged QTc - suspect 2/2 demand stress - EKG no acute STE fingings - optimize electrolytes, avoid Qtc prolonging meds - consider echo.  Currently not a candidate for any invasive workup with neuro status  Hx paranoid schizophrenia - not compliant with meds per mother  GOC - pending update with mother 10/1  Labs   CBC: Recent  Labs  Lab 01/03/24 1119 01/03/24 1137 01/04/24 0446  WBC 19.6*  --  17.7*  NEUTROABS 15.7*  --   --   HGB 14.7 12.6* 13.4  HCT 43.7 37.0* 38.8*  MCV 90.9  --  88.2  PLT 335  --  281    Basic Metabolic Panel: Recent Labs  Lab 01/03/24 1119 01/03/24 1137 01/03/24 1640 01/03/24 1837 01/04/24 0446  NA 148* 146*  --  144 145  K <2.0* <2.0*  --  2.1* 2.7*  CL 111  --   --  108 110  CO2 20*  --   --  20* 19*  GLUCOSE 63*  --   --  118* 126*  BUN 34*  --   --  37* 41*  CREATININE 5.11*  --   --  4.69* 5.09*  CALCIUM 7.8*  --   --  7.4* 7.4*  MG  --   --  1.8  --  1.7   GFR: Estimated Creatinine Clearance: 22.4 mL/min (A) (by C-G formula based on SCr of 5.09 mg/dL (H)). Recent Labs  Lab 01/03/24 1119 01/03/24 1130 01/03/24 1640 01/04/24 0446  WBC 19.6*  --   --  17.7*  LATICACIDVEN  --  2.7* 1.9  --  Liver Function Tests: Recent Labs  Lab 01/03/24 1119  AST 45*  ALT 22  ALKPHOS 87  BILITOT 0.7  PROT 5.7*  ALBUMIN 1.8*   No results for input(s): LIPASE, AMYLASE in the last 168 hours. Recent Labs  Lab 01/03/24 1203  AMMONIA 55*    ABG    Component Value Date/Time   PHART 7.247 (L) 01/03/2024 1137   PCO2ART 55.3 (H) 01/03/2024 1137   PO2ART 280 (H) 01/03/2024 1137   HCO3 24.1 01/03/2024 1137   TCO2 26 01/03/2024 1137   ACIDBASEDEF 4.0 (H) 01/03/2024 1137   O2SAT 100 01/03/2024 1137     Coagulation Profile: No results for input(s): INR, PROTIME in the last 168 hours.  Cardiac Enzymes: Recent Labs  Lab 01/03/24 1119  CKTOTAL 1,475*  CKMB 32.2*    HbA1C: Hgb A1c MFr Bld  Date/Time Value Ref Range Status  01/03/2024 11:19 AM 9.3 (H) 4.8 - 5.6 % Final    Comment:    (NOTE) Diagnosis of Diabetes The following HbA1c ranges recommended by the American Diabetes Association (ADA) may be used as an aid in the diagnosis of diabetes mellitus.  Hemoglobin             Suggested A1C NGSP%              Diagnosis  <5.7                    Non Diabetic  5.7-6.4                Pre-Diabetic  >6.4                   Diabetic  <7.0                   Glycemic control for                       adults with diabetes.      CBG: Recent Labs  Lab 01/03/24 1116 01/03/24 1515 01/03/24 2009 01/03/24 2340 01/04/24 0346  GLUCAP 66* 126* 112* 153* 126*    Critical care time:     CRITICAL CARE Performed by: Lyle Pesa   Total critical care time: 38 minutes  Critical care time was exclusive of separately billable procedures and treating other patients.  Critical care was necessary to treat or prevent imminent or life-threatening deterioration.  Critical care was time spent personally by me on the following activities: development of treatment plan with patient and/or surrogate as well as nursing, discussions with consultants, evaluation of patient's response to treatment, examination of patient, obtaining history from patient or surrogate, ordering and performing treatments and interventions, ordering and review of laboratory studies, ordering and review of radiographic studies, pulse oximetry and re-evaluation of patient's condition.    Lyle Pesa, NP [facility] 01/04/2024, 6:59 AM  See Amion for pager If no response to pager , please call [phone] until 7pm After 7:00 pm call Elink  [phone]

## 2024-01-04 DEATH — deceased

## 2024-01-05 DIAGNOSIS — R21 Rash and other nonspecific skin eruption: Secondary | ICD-10-CM | POA: Diagnosis not present

## 2024-01-05 DIAGNOSIS — J9692 Respiratory failure, unspecified with hypercapnia: Secondary | ICD-10-CM

## 2024-01-05 DIAGNOSIS — Z515 Encounter for palliative care: Secondary | ICD-10-CM

## 2024-01-05 DIAGNOSIS — Z7189 Other specified counseling: Secondary | ICD-10-CM

## 2024-01-05 DIAGNOSIS — J9691 Respiratory failure, unspecified with hypoxia: Secondary | ICD-10-CM

## 2024-01-05 DIAGNOSIS — E162 Hypoglycemia, unspecified: Secondary | ICD-10-CM

## 2024-01-05 DIAGNOSIS — N179 Acute kidney failure, unspecified: Secondary | ICD-10-CM | POA: Diagnosis not present

## 2024-01-05 DIAGNOSIS — G9341 Metabolic encephalopathy: Secondary | ICD-10-CM | POA: Diagnosis not present

## 2024-01-05 DIAGNOSIS — J9601 Acute respiratory failure with hypoxia: Secondary | ICD-10-CM | POA: Diagnosis not present

## 2024-01-05 LAB — CBC
HCT: 32.3 % — ABNORMAL LOW (ref 39.0–52.0)
Hemoglobin: 11.3 g/dL — ABNORMAL LOW (ref 13.0–17.0)
MCH: 31 pg (ref 26.0–34.0)
MCHC: 35 g/dL (ref 30.0–36.0)
MCV: 88.7 fL (ref 80.0–100.0)
Platelets: 212 K/uL (ref 150–400)
RBC: 3.64 MIL/uL — ABNORMAL LOW (ref 4.22–5.81)
RDW: 16 % — ABNORMAL HIGH (ref 11.5–15.5)
WBC: 14.1 K/uL — ABNORMAL HIGH (ref 4.0–10.5)
nRBC: 0 % (ref 0.0–0.2)

## 2024-01-05 LAB — BASIC METABOLIC PANEL WITH GFR
Anion gap: 14 (ref 5–15)
BUN: 51 mg/dL — ABNORMAL HIGH (ref 6–20)
CO2: 18 mmol/L — ABNORMAL LOW (ref 22–32)
Calcium: 7.6 mg/dL — ABNORMAL LOW (ref 8.9–10.3)
Chloride: 114 mmol/L — ABNORMAL HIGH (ref 98–111)
Creatinine, Ser: 5.62 mg/dL — ABNORMAL HIGH (ref 0.61–1.24)
GFR, Estimated: 11 mL/min — ABNORMAL LOW (ref >60)
Glucose, Bld: 286 mg/dL — ABNORMAL HIGH (ref 70–99)
Potassium: 3.5 mmol/L (ref 3.5–5.1)
Sodium: 146 mmol/L — ABNORMAL HIGH (ref 135–145)

## 2024-01-05 LAB — POCT I-STAT 7, (LYTES, BLD GAS, ICA,H+H)
Acid-base deficit: 4 mmol/L — ABNORMAL HIGH (ref 0.0–2.0)
Bicarbonate: 18.4 mmol/L — ABNORMAL LOW (ref 20.0–28.0)
Calcium, Ion: 1.1 mmol/L — ABNORMAL LOW (ref 1.15–1.40)
HCT: 32 % — ABNORMAL LOW (ref 39.0–52.0)
Hemoglobin: 10.9 g/dL — ABNORMAL LOW (ref 13.0–17.0)
O2 Saturation: 100 %
Patient temperature: 98.1
Potassium: 3.8 mmol/L (ref 3.5–5.1)
Sodium: 145 mmol/L (ref 135–145)
TCO2: 19 mmol/L — ABNORMAL LOW (ref 22–32)
pCO2 arterial: 25.1 mmHg — ABNORMAL LOW (ref 32–48)
pH, Arterial: 7.472 — ABNORMAL HIGH (ref 7.35–7.45)
pO2, Arterial: 188 mmHg — ABNORMAL HIGH (ref 83–108)

## 2024-01-05 LAB — GLUCOSE, CAPILLARY
Glucose-Capillary: 257 mg/dL — ABNORMAL HIGH (ref 70–99)
Glucose-Capillary: 261 mg/dL — ABNORMAL HIGH (ref 70–99)
Glucose-Capillary: 265 mg/dL — ABNORMAL HIGH (ref 70–99)
Glucose-Capillary: 291 mg/dL — ABNORMAL HIGH (ref 70–99)
Glucose-Capillary: 298 mg/dL — ABNORMAL HIGH (ref 70–99)
Glucose-Capillary: 303 mg/dL — ABNORMAL HIGH (ref 70–99)

## 2024-01-05 LAB — MAGNESIUM
Magnesium: 2 mg/dL (ref 1.7–2.4)
Magnesium: 2.2 mg/dL (ref 1.7–2.4)

## 2024-01-05 LAB — VANCOMYCIN, RANDOM: Vancomycin Rm: 14 ug/mL

## 2024-01-05 LAB — CK: Total CK: 619 U/L — ABNORMAL HIGH (ref 49–397)

## 2024-01-05 LAB — PHOSPHORUS: Phosphorus: 5.2 mg/dL — ABNORMAL HIGH (ref 2.5–4.6)

## 2024-01-05 MED ORDER — PROSOURCE TF20 ENFIT COMPATIBL EN LIQD
60.0000 mL | Freq: Two times a day (BID) | ENTERAL | Status: DC
Start: 2024-01-05 — End: 2024-01-15
  Administered 2024-01-05 – 2024-01-15 (×42): 60 mL
  Filled 2024-01-05 (×21): qty 60

## 2024-01-05 MED ORDER — INSULIN GLARGINE 100 UNIT/ML ~~LOC~~ SOLN
5.0000 [IU] | Freq: Every day | SUBCUTANEOUS | Status: DC
  Administered 2024-01-05 (×2): 5 [IU] via SUBCUTANEOUS
  Filled 2024-01-05: qty 0.05

## 2024-01-05 MED ORDER — OSMOLITE 1.5 CAL PO LIQD
1000.0000 mL | ORAL | Status: DC
  Administered 2024-01-05 – 2024-01-15 (×22): 1000 mL
  Filled 2024-01-05 (×3): qty 1000

## 2024-01-05 MED ORDER — LINEZOLID 600 MG/300ML IV SOLN
600.0000 mg | Freq: Two times a day (BID) | INTRAVENOUS | Status: AC
  Administered 2024-01-05 – 2024-01-09 (×20): 600 mg via INTRAVENOUS
  Filled 2024-01-05 (×10): qty 300

## 2024-01-05 MED ORDER — ADULT MULTIVITAMIN W/MINERALS CH
1.0000 | ORAL_TABLET | Freq: Every day | ORAL | Status: DC
  Administered 2024-01-05 – 2024-01-15 (×22): 1
  Filled 2024-01-05 (×11): qty 1

## 2024-01-05 MED ORDER — POTASSIUM CHLORIDE 20 MEQ PO PACK
40.0000 meq | PACK | Freq: Once | ORAL | Status: AC
  Administered 2024-01-05 (×2): 40 meq
  Filled 2024-01-05: qty 2

## 2024-01-05 MED ORDER — INSULIN GLARGINE 100 UNIT/ML ~~LOC~~ SOLN
5.0000 [IU] | Freq: Two times a day (BID) | SUBCUTANEOUS | Status: DC
  Administered 2024-01-05 (×2): 5 [IU] via SUBCUTANEOUS
  Filled 2024-01-05 (×3): qty 0.05

## 2024-01-05 NOTE — Progress Notes (Addendum)
 NAME:  Steven Christensen, MRN:  [id number], DOB:  [birth year], LOS: 2 ADMISSION DATE:  01/03/2024, CONSULTATION DATE:  01/03/24 REFERRING MD:  EDP, CHIEF COMPLAINT:  AMS   History of Present Illness:  53 year old man w/ hx of polysubstance abuse, associated schizoaffective disorder, and DM with not feeling well since 9/28 with vomiting with progressive AMS.  Found obtunded by EMS and hypoglycemic.  Intubated for airway protection.  AKI, hypokalemic, +cocaine.  Pan scan done and PCCM consulted.   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  Non compliant with most of his meds.  Pertinent  Medical History  Schizophrenia Polysubstance abuse- tobacco, cocaine, alcohol DM Suicide attempts  Significant Hospital Events: Including procedures, antibiotic start and stop dates in addition to other pertinent events   9/30 admit, intubated   Interim History / Subjective:  Somewhat fluctuating HR/BP overnight, no other significant changes.  Objective    Blood pressure (!) 150/89, pulse 66, temperature 97.9 F (36.6 C), resp. rate 16, height 6' 4 (1.93 m), weight 114.5 kg, SpO2 100%. CVP:  [0 mmHg-31 mmHg] 9 mmHg  Vent Mode: PRVC FiO2 (%):  [40 %] 40 % Set Rate:  [20 bmp] 20 bmp Vt Set:  [690 mL] 690 mL PEEP:  [5 cmH20] 5 cmH20 Plateau Pressure:  [17 cmH20-20 cmH20] 17 cmH20   Intake/Output Summary (Last 24 hours) at 01/05/2024 0902 Last data filed at 01/05/2024 0700 Gross per 24 hour  Intake 5552.3 ml  Output 210 ml  Net 5342.3 ml   Filed Weights   01/03/24 1537 01/04/24 0450 01/05/24 0500  Weight: 105.5 kg 110.8 kg 114.5 kg    Examination: General: Chronically ill-appearing. HENT: Pupils 2 mm, nonreactive.  MMM.  Orally intubated. Lungs: Coarse breath sounds bilaterally. Cardiovascular: RRR Extremities: Skin warm and dry, edema to bilateral LEs. RUE with dressing in place. Neuro:  Nonresponsive  Resolved problem list   Assessment and Plan   Acute anoxic/metabolic/toxic encephalopathy with suspected myoclonus  Hx polysubstance abuse Remains non responsive off of sedation. GOC discussion with mother yesterday; continue supportive care for now and reassess GOC in 24-48hrs. - continue neuro exams - maintain neuro protective measures; goal for eurothermia, euglycemia, eunatermia, normoxia, and PCO2 goal of 35-40 - Seizure precautions  - currently not requiring sedation - continue empiric high dose thiamine  - ongoing GOC, poor overall prognosis   Acute hypoxemic respiratory failure Aspiration PNA R >L pleural effusion Sepsis 2/2 Aspiration PNA  Downtrending leukocytosis, afebrile overnight. - continue full MV support - intermittent CXR/ABG - FiO2 goal SpO2 >92% - hypertensive at times, otherwise MAP stable, goal >65 - mental status remains barrier for extubation  - continue Linezolid - trend leuks and fever curve   RUE blistering - continue wound care - empiric abx as above   Acute renal failure AGMA Hypokalemia Hypomagnesemia  Elevated CK - trending down - trend BMP - strict I/Os, daily wts - avoid nephrotoxins, renal dose meds, hemodynamic support as above   DMT2 Hypoglycemia - A1c 9.3 - CBG's have been stable, cont trend q4, prn SSI goal 140-180 - add Lantus 5U daily - trickle feeds, advance as tolerated    Elevated troponins Prolonged QTc Suspect 2/2 demand stress - optimize electrolytes, avoid Qtc prolonging meds - consider echo.  Currently not a candidate for any invasive workup with neuro status.   Hx paranoid schizophrenia - not compliant with meds per mother  GOC - ongoing as above   Labs   CBC: Recent Labs  Lab 01/03/24 1119 01/03/24 1137 01/04/24 0446 01/04/24 0942 01/05/24 0426  WBC 19.6*  --  17.7*  --  14.1*  NEUTROABS 15.7*  --   --   --   --   HGB 14.7 12.6* 13.4 10.9* 11.3*  HCT 43.7 37.0* 38.8* 32.0*  32.3*  MCV 90.9  --  88.2  --  88.7  PLT 335  --  281  --  212    Basic Metabolic Panel: Recent Labs  Lab 01/03/24 1640 01/03/24 1837 01/04/24 0446 01/04/24 0942 01/04/24 1156 01/04/24 1711 01/05/24 0426  NA  --  144 145 145 145 143 146*  K  --  2.1* 2.7* 3.1* 3.3* 3.2* 3.5  CL  --  108 110  --  111 111 114*  CO2  --  20* 19*  --  18* 17* 18*  GLUCOSE  --  118* 126*  --  181* 236* 286*  BUN  --  37* 41*  --  44* 47* 51*  CREATININE  --  4.69* 5.09*  --  5.38* 5.35* 5.62*  CALCIUM  --  7.4* 7.4*  --  7.3* 7.2* 7.6*  MG 1.8  --  1.7  --   --  2.0 2.0  PHOS  --   --  6.9*  --   --   --  5.2*   GFR: Estimated Creatinine Clearance: 20.6 mL/min (A) (by C-G formula based on SCr of 5.62 mg/dL (H)). Recent Labs  Lab 01/03/24 1119 01/03/24 1130 01/03/24 1640 01/04/24 0446 01/05/24 0426  WBC 19.6*  --   --  17.7* 14.1*  LATICACIDVEN  --  2.7* 1.9  --   --     Liver Function Tests: Recent Labs  Lab 01/03/24 1119  AST 45*  ALT 22  ALKPHOS 87  BILITOT 0.7  PROT 5.7*  ALBUMIN 1.8*   No results for input(s): LIPASE, AMYLASE in the last 168 hours. Recent Labs  Lab 01/03/24 1203  AMMONIA 55*    ABG    Component Value Date/Time   PHART 7.247 (L) 01/03/2024 1137   PCO2ART 55.3 (H) 01/03/2024 1137   PO2ART 280 (H) 01/03/2024 1137   HCO3 19.0 (L) 01/04/2024 0942   TCO2 20 (L) 01/04/2024 0942   ACIDBASEDEF 5.0 (H) 01/04/2024 0942   O2SAT 79 01/04/2024 0942     Coagulation Profile: No results for input(s): INR, PROTIME in the last 168 hours.  Cardiac Enzymes: Recent Labs  Lab 01/03/24 1119 01/04/24 0446 01/05/24 0426  CKTOTAL 1,475* 1,717* 619*  CKMB 32.2*  --   --     HbA1C: Hgb A1c MFr Bld  Date/Time Value Ref Range Status  01/03/2024 11:19 AM 9.3 (H) 4.8 - 5.6 % Final    Comment:    (NOTE) Diagnosis of Diabetes The following HbA1c ranges recommended by the American Diabetes Association (ADA) may be used as an aid in the diagnosis of  diabetes mellitus.  Hemoglobin             Suggested A1C NGSP%              Diagnosis  <5.7                   Non Diabetic  5.7-6.4                Pre-Diabetic  >6.4  Diabetic  <7.0                   Glycemic control for                       adults with diabetes.      CBG: Recent Labs  Lab 01/04/24 1525 01/04/24 2006 01/04/24 2345 01/05/24 0310 01/05/24 0723  GLUCAP 173* 255* 233* 261* 265*    Review of Systems:   See HPI  Past Medical History:  He,  has no past medical history on file.   Surgical History:  History reviewed. No pertinent surgical history.   Social History:      Family History:  His family history is not on file.   Allergies No Known Allergies   Home Medications  Prior to Admission medications   Medication Sig Start Date End Date Taking? Authorizing Provider  furosemide (LASIX) 20 MG tablet Take 20 mg by mouth daily as needed. 12/28/23  Yes [provider]  gabapentin (NEURONTIN) 300 MG capsule Take 300 mg by mouth 3 (three) times daily. 12/28/23  Yes [provider]  LANTUS SOLOSTAR 100 UNIT/ML Solostar Pen Inject 50 Units into the skin daily. 12/28/23  Yes [provider]  losartan-hydrochlorothiazide (HYZAAR) 100-12.5 MG tablet Take 1 tablet by mouth daily. 12/28/23  Yes [provider]  metFORMIN (GLUCOPHAGE) 500 MG tablet Take 500 mg by mouth 2 (two) times daily. 12/28/23  Yes [provider]     Lauraine Norse, DO [facility], PGY-2 01/05/24 9:02 AM

## 2024-01-05 NOTE — Progress Notes (Signed)
 Nutrition Brief Note  Patient discussed in interdisciplinary rounds.  Ongoing concerns for anoxic injury.  Ongoing goals of care discussion.  Continues to remains intubated on vent support.  Pt tolerated initiation of trickle tube feeds, plans to advance further towards goal today. Orders entered and discussed with RN.   Nutrition interventions: Advance Osmolite 1.5 by 10ml q8h to goal rate of 44ml/hr ( daily) 60ml ProSource TF20 BID Provides 2500 kcal, 138g protein, free water daily  Suspect to be at elevated risk for refeeding syndrome, recommend monitoring potassium, magnesium and phosphorus daily for at least 4 days. Continue Thiamine as ordered in the setting of polysubstance use.    Add MVI with minerals daily and Juven BID to support wound healing.  Allie Khaliya Golinski, RDN, LDN Clinical Nutrition See AMiON for contact information.

## 2024-01-05 NOTE — TOC CM/SW Note (Signed)
 Transition of Care [facility]) - Inpatient Brief Assessment   Patient Details  Name: Haygen Zebrowski MRN: [id number] Date of Birth: Aug 15, 1970  Transition of Care [facility] CM/SW Contact:    Lauraine FORBES Saa, LCSWA Phone Number: 01/05/2024, 2:57 PM   Clinical Narrative:  2:57 PM Per chart review, patient has a PCP and insurance. Patient does not have SNF/HH/DME history. Patient's preferred pharmacy's are [facility] and [facility] [phone]. Patient is currently intubated and unable to answer SDOH questions. No other TOC needs identified at this time. TOC will continue to follow.  Transition of Care Asessment: Insurance and Status: Insurance coverage has been reviewed Patient has primary care physician: Yes Home environment has been reviewed: Private Residence Prior level of function:: N/A Prior/Current Home Services: No current home services Social Drivers of Health Review:  (Patient unable to answer) Readmission risk has been reviewed: Yes (Currently Yellow 17%) Transition of care needs: no transition of care needs at this time

## 2024-01-05 NOTE — Plan of Care (Signed)
  Problem: Metabolic: Goal: Ability to maintain appropriate glucose levels will improve Outcome: Progressing   Problem: Nutritional: Goal: Maintenance of adequate nutrition will improve Outcome: Progressing   Problem: Tissue Perfusion: Goal: Adequacy of tissue perfusion will improve Outcome: Progressing   Problem: Clinical Measurements: Goal: Ability to maintain clinical measurements within normal limits will improve Outcome: Progressing Goal: Will remain free from infection Outcome: Progressing Goal: Diagnostic test results will improve Outcome: Progressing Goal: Cardiovascular complication will be avoided Outcome: Progressing   Problem: Nutrition: Goal: Adequate nutrition will be maintained Outcome: Progressing   Problem: Coping: Goal: Level of anxiety will decrease Outcome: Progressing   Problem: Elimination: Goal: Will not experience complications related to bowel motility Outcome: Progressing Goal: Will not experience complications related to urinary retention Outcome: Progressing   Problem: Pain Managment: Goal: General experience of comfort will improve and/or be controlled Outcome: Progressing   Problem: Safety: Goal: Ability to remain free from injury will improve Outcome: Progressing   Problem: Skin Integrity: Goal: Risk for impaired skin integrity will decrease Outcome: Progressing

## 2024-01-05 NOTE — Consult Note (Signed)
 Palliative Care Consult Note                                  Date: 01/05/2024   Patient Name: Steven Christensen  DOB: [birth year]  MRN: [id number]  Age / Sex: 53 y.o., male  PCP: Shelda Atlas, MD Referring Physician: Neda Jennet LABOR, MD  Reason for Consultation: Establishing goals of care  HPI/Patient Profile: 53 y.o. male  with past medical history of polysubstance abuse, associated schizoaffective disorder, and diabetes who presented on 01/03/2024 with progressive altered mental status.  On EMS arrival, he was found obtunded and hypoglycemic.  Intubated in the ED for airway protection.  Initial workup showed AKI, hypokalemia, and positive cocaine.  He is admitted with anoxic encephalopathy, acute respiratory failure, and acute renal failure.  Palliative Medicine has been consulted for goals of care discussions and complex medical decision making.   Clinical Assessment and Goals of Care:   Extensive chart review has been completed including labs, vital signs, imaging, progress/consult notes, orders, medications and available advance directive documents.  Reviewed IPAL note from 01/04/2024.  Update received from RN.  Patient assessed at bedside.  He remains intubated and is not initiating breaths over the ventilator.  He has no gag and no deep cough.  Ongoing concerns for anoxic brain injury.  I spoke with his mother/Steven Christensen by phone to discuss diagnosis, prognosis, and GOC.  I introduced Palliative Medicine as specialized medical care for people living with serious illness. It focuses on providing relief from the symptoms and stress of a serious illness.   Created space and opportunity for mother to express thoughts and feelings regarding current medical situation. Values and goals of care were attempted to be elicited.  Social History: Prior to admission, patient lived with his mother.  Never married.  1 minor child.  Previously worked in  Aeronautical engineer.  Mother shares that patient has struggled with substance abuse for many years.  Discussion: We discussed patient's current illness and what it means in the larger context of his/her ongoing co-morbidities. Current clinical status was reviewed. Naomie reports understanding that patient is brain dead, however I attempted to clarify that he has neurologic injury due to lack of oxygen to the brain.   Discussed patient's overall poor prognosis in the setting of anoxic brain injury, as well as concern that he will not have meaningful recovery even with best case scenario. Mother verbalizes understanding and states she does not want him to suffer.   Discussed a comfort path, which would entail liberalization from the ventilator and allowing for a natural death. Mother shares that she previously had to make this difficult decision for her sister.  Emotional support provided  Discussed the importance of continued conversation with the medical team regarding overall plan of care. Mother is agreeable to meet with me in person to further discuss GOC, however she will not be able to come to the hospital until later tomorrow afternoon. We have planned to meet either tomorrow afternoon or Saturday.    Review of Systems  Unable to perform ROS   Objective:   Primary Diagnoses: Present on Admission:  Acute metabolic encephalopathy   Physical Exam Vitals reviewed.  Constitutional:      General: He is not in acute distress.    Comments: Critically ill-appearing  Cardiovascular:     Rate and Rhythm: Normal rate.  Pulmonary:     Comments: Intubated Neurological:  Mental Status: He is unresponsive.     Palliative Assessment/Data: PPS 30%     Assessment & Plan:   SUMMARY OF RECOMMENDATIONS   Continue current scope of care Plan to meet with mother tomorrow afternoon or Saturday Ongoing palliative support  Primary Decision Maker: NEXT OF KIN - mother/Steven Christensen  Existing  Vynca/ACP Documentation: None  Code Status/Advance Care Planning: DNR with interventions  Prognosis:  Overall poor  Discharge Planning:  To Be Determined    Thank you for allowing us  to participate in the care of Steven Christensen   Time Total: 76 minutes  Detailed review of medical records (labs, imaging, vital signs), medically appropriate exam, discussed with treatment team, counseling and education to patient, family, & staff, documenting clinical information, coordination of care.   Signed by: Recardo Loll, NP Palliative Medicine Team  Team Phone # [phone]  For individual providers, please see AMION

## 2024-01-05 NOTE — Inpatient Diabetes Management (Signed)
 Inpatient Diabetes Program Recommendations  AACE/ADA: New Consensus Statement on Inpatient Glycemic Control (2015)  Target Ranges:  Prepandial:   less than 140 mg/dL      Peak postprandial:   less than 180 mg/dL (1-2 hours)      Critically ill patients:  140 - 180 mg/dL   Lab Results  Component Value Date   GLUCAP 303 (H) 01/05/2024   HGBA1C 9.3 (H) 01/03/2024    Review of Glycemic Control  Latest Reference Range & Units 01/05/24 03:10 01/05/24 07:23 01/05/24 10:59  Glucose-Capillary 70 - 99 mg/dL 738 (H) 734 (H) 696 (H)   Diabetes history: DM 2 Outpatient Diabetes medications:  Lantus 50 units daily, Metformin 500 mg bid Current orders for Inpatient glycemic control:  Novolog 0-9 units q 4 hours Osmolite 20 ml/hr Lantus 5 units daily Inpatient Diabetes Program Recommendations:        If appropriate, consider increasing Lantus to 5 units bid and possibly add Novolog 2 units q 4 hours for tube feed coverage?   Thanks,  Randall Bullocks, RN, BC-ADM Inpatient Diabetes Coordinator Pager [phone] (8a-5p)

## 2024-01-06 DIAGNOSIS — G9341 Metabolic encephalopathy: Secondary | ICD-10-CM | POA: Diagnosis not present

## 2024-01-06 DIAGNOSIS — J9691 Respiratory failure, unspecified with hypoxia: Secondary | ICD-10-CM | POA: Diagnosis not present

## 2024-01-06 DIAGNOSIS — Z7189 Other specified counseling: Secondary | ICD-10-CM | POA: Diagnosis not present

## 2024-01-06 DIAGNOSIS — R21 Rash and other nonspecific skin eruption: Secondary | ICD-10-CM | POA: Diagnosis not present

## 2024-01-06 DIAGNOSIS — N179 Acute kidney failure, unspecified: Secondary | ICD-10-CM | POA: Diagnosis not present

## 2024-01-06 DIAGNOSIS — Z515 Encounter for palliative care: Secondary | ICD-10-CM | POA: Diagnosis not present

## 2024-01-06 DIAGNOSIS — J9601 Acute respiratory failure with hypoxia: Secondary | ICD-10-CM | POA: Diagnosis not present

## 2024-01-06 LAB — PHOSPHORUS: Phosphorus: 5.3 mg/dL — ABNORMAL HIGH (ref 2.5–4.6)

## 2024-01-06 LAB — BASIC METABOLIC PANEL WITH GFR
Anion gap: 10 (ref 5–15)
BUN: 61 mg/dL — ABNORMAL HIGH (ref 6–20)
CO2: 20 mmol/L — ABNORMAL LOW (ref 22–32)
Calcium: 7.6 mg/dL — ABNORMAL LOW (ref 8.9–10.3)
Chloride: 118 mmol/L — ABNORMAL HIGH (ref 98–111)
Creatinine, Ser: 5.62 mg/dL — ABNORMAL HIGH (ref 0.61–1.24)
GFR, Estimated: 11 mL/min — ABNORMAL LOW (ref >60)
Glucose, Bld: 249 mg/dL — ABNORMAL HIGH (ref 70–99)
Potassium: 3.2 mmol/L — ABNORMAL LOW (ref 3.5–5.1)
Sodium: 148 mmol/L — ABNORMAL HIGH (ref 135–145)

## 2024-01-06 LAB — CULTURE, RESPIRATORY W GRAM STAIN: Gram Stain: NONE SEEN

## 2024-01-06 LAB — CBC
HCT: 31.6 % — ABNORMAL LOW (ref 39.0–52.0)
Hemoglobin: 10.7 g/dL — ABNORMAL LOW (ref 13.0–17.0)
MCH: 30.9 pg (ref 26.0–34.0)
MCHC: 33.9 g/dL (ref 30.0–36.0)
MCV: 91.3 fL (ref 80.0–100.0)
Platelets: 188 K/uL (ref 150–400)
RBC: 3.46 MIL/uL — ABNORMAL LOW (ref 4.22–5.81)
RDW: 16.4 % — ABNORMAL HIGH (ref 11.5–15.5)
WBC: 12.7 K/uL — ABNORMAL HIGH (ref 4.0–10.5)
nRBC: 0 % (ref 0.0–0.2)

## 2024-01-06 LAB — GLUCOSE, CAPILLARY
Glucose-Capillary: 249 mg/dL — ABNORMAL HIGH (ref 70–99)
Glucose-Capillary: 195 mg/dL — ABNORMAL HIGH (ref 70–99)
Glucose-Capillary: 236 mg/dL — ABNORMAL HIGH (ref 70–99)
Glucose-Capillary: 213 mg/dL — ABNORMAL HIGH (ref 70–99)
Glucose-Capillary: 192 mg/dL — ABNORMAL HIGH (ref 70–99)
Glucose-Capillary: 259 mg/dL — ABNORMAL HIGH (ref 70–99)

## 2024-01-06 LAB — CK: Total CK: 371 U/L (ref 49–397)

## 2024-01-06 LAB — MAGNESIUM: Magnesium: 2.1 mg/dL (ref 1.7–2.4)

## 2024-01-06 MED ORDER — INSULIN GLARGINE 100 UNIT/ML ~~LOC~~ SOLN
10.0000 [IU] | Freq: Two times a day (BID) | SUBCUTANEOUS | Status: DC
Start: 2024-01-06 — End: 2024-01-07
  Administered 2024-01-06 – 2024-01-07 (×6): 10 [IU] via SUBCUTANEOUS
  Filled 2024-01-06 (×4): qty 0.1

## 2024-01-06 MED ORDER — POTASSIUM CHLORIDE 20 MEQ PO PACK: 40.0000 meq | PACK | ORAL | Status: DC

## 2024-01-06 MED ORDER — POTASSIUM CHLORIDE 20 MEQ PO PACK
40.0000 meq | PACK | Freq: Once | ORAL | Status: AC
  Administered 2024-01-06 (×2): 40 meq
  Filled 2024-01-06: qty 2

## 2024-01-06 MED ORDER — FREE WATER
200.0000 mL | Status: DC
  Administered 2024-01-06 – 2024-01-07 (×12): 200 mL

## 2024-01-06 MED ORDER — INSULIN ASPART 100 UNIT/ML IJ SOLN
2.0000 [IU] | INTRAMUSCULAR | Status: DC
  Administered 2024-01-06 – 2024-01-08 (×26): 2 [IU] via SUBCUTANEOUS

## 2024-01-06 NOTE — Progress Notes (Signed)
 NAME:  Steven Christensen, MRN:  [id number], DOB:  July 27, 1970, LOS: 3 ADMISSION DATE:  01/03/2024, CONSULTATION DATE:  01/03/24 REFERRING MD:  EDP, CHIEF COMPLAINT:  AMS   History of Present Illness:  53 year old man w/ hx of polysubstance abuse, associated schizoaffective disorder, and DM with not feeling well since 9/28 with vomiting with progressive AMS.  Found obtunded by EMS and hypoglycemic.  Intubated for airway protection.  AKI, hypokalemic, +cocaine.  Pan scan done and PCCM consulted.   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  Non compliant with most of his meds.  Pertinent  Medical History  Schizophrenia Polysubstance abuse- tobacco, cocaine, alcohol DM Suicide attempts  Significant Hospital Events: Including procedures, antibiotic start and stop dates in addition to other pertinent events   9/30 admit, intubated   Interim History / Subjective:  NAEON.   Objective    Blood pressure (!) 144/85, pulse 63, temperature (!) 96.6 F (35.9 C), resp. rate (!) 5, height 6' 4 (1.93 m), weight 114.5 kg, SpO2 100%. CVP:  [4 mmHg-12 mmHg] 9 mmHg  Vent Mode: PSV;CPAP FiO2 (%):  [40 %] 40 % Set Rate:  [16 bmp-20 bmp] 16 bmp Vt Set:  [690 mL] 690 mL PEEP:  [5 cmH20] 5 cmH20 Pressure Support:  [5 cmH20] 5 cmH20 Plateau Pressure:  [16 cmH20-23 cmH20] 16 cmH20   Intake/Output Summary (Last 24 hours) at 01/06/2024 0740 Last data filed at 01/06/2024 0600 Gross per 24 hour  Intake 1452.67 ml  Output 800 ml  Net 652.67 ml   Filed Weights   01/03/24 1537 01/04/24 0450 01/05/24 0500  Weight: 105.5 kg 110.8 kg 114.5 kg    Examination: General: Chronically ill-appearing.  Lying in bed not responsive to verbal stimuli. HENT: Pupils 2 mm, nonreactive.  Orally intubated. Lungs: Coarse breath sounds bilaterally. Cardiovascular: RRR. Abdomen: Bowel sounds appreciated, soft. Neuro: Pupils nonreactive,  does withdraw left LE from pain.  Resolved problem list   Assessment and Plan   Anoxic encephalopathy, metabolic encephalopathy Hx polysubstance abuse Remains non responsive off of sedation. - maintain neuro protective measures; goal for eurothermia, euglycemia, eunatermia, normoxia, and PCO2 goal of 35-40 - Seizure precautions  - currently not requiring sedation - ongoing GOC, poor overall prognosis   Acute hypoxemic respiratory failure Aspiration PNA R >L pleural effusion Sepsis 2/2 Aspiration PNA  Downtrending leukocytosis, afebrile overnight. - continue full MV support -Target TVol 6-8cc/kgIBW -Target Plateau Pressure < 30cm H20 -Target driving pressure less than 15 cm of water -Target PaO2 55-65: titrate PEEP/FiO2 per protocol -Ventilator associated pneumonia prevention protocol - intermittent CXR/ABG - hypertensive at times, otherwise MAP stable, goal >65 - continue Linezolid, zosyn - trend leuks and fever curve    RUE blistering - continue wound care   Acute renal failure Electrolyte derangement Elevated CK - trending down Rhabdomyolysis/elevated CK resolved now. - trend BMP - strict I/Os, daily wts - avoid nephrotoxins, renal dose meds, hemodynamic support as above   Hypoglycemia - continue monitoring CBGs - advance tube feeds as tolerated    Hx paranoid schizophrenia - not compliant with meds per mother   Labs   CBC: Recent Labs  Lab 01/03/24 1119 01/03/24 1137 01/04/24 0446 01/04/24 0942 01/05/24 0426 01/05/24 1351 01/06/24 0336  WBC 19.6*  --  17.7*  --  14.1*  --  12.7*  NEUTROABS 15.7*  --   --   --   --   --   --  HGB 14.7   < > 13.4 10.9* 11.3* 10.9* 10.7*  HCT 43.7   < > 38.8* 32.0* 32.3* 32.0* 31.6*  MCV 90.9  --  88.2  --  88.7  --  91.3  PLT 335  --  281  --  212  --  188   < > = values in this interval not displayed.    Basic Metabolic Panel: Recent Labs  Lab 01/04/24 0446 01/04/24 0942 01/04/24 1156 01/04/24 1711  01/05/24 0426 01/05/24 1351 01/05/24 1756 01/06/24 0336  NA 145   < > 145 143 146* 145  --  148*  K 2.7*   < > 3.3* 3.2* 3.5 3.8  --  3.2*  CL 110  --  111 111 114*  --   --  118*  CO2 19*  --  18* 17* 18*  --   --  20*  GLUCOSE 126*  --  181* 236* 286*  --   --  249*  BUN 41*  --  44* 47* 51*  --   --  61*  CREATININE 5.09*  --  5.38* 5.35* 5.62*  --   --  5.62*  CALCIUM 7.4*  --  7.3* 7.2* 7.6*  --   --  7.6*  MG 1.7  --   --  2.0 2.0  --  2.2 2.1  PHOS 6.9*  --   --   --  5.2*  --   --  5.3*   < > = values in this interval not displayed.   GFR: Estimated Creatinine Clearance: 20.6 mL/min (A) (by C-G formula based on SCr of 5.62 mg/dL (H)). Recent Labs  Lab 01/03/24 1119 01/03/24 1130 01/03/24 1640 01/04/24 0446 01/05/24 0426 01/06/24 0336  WBC 19.6*  --   --  17.7* 14.1* 12.7*  LATICACIDVEN  --  2.7* 1.9  --   --   --     Liver Function Tests: Recent Labs  Lab 01/03/24 1119  AST 45*  ALT 22  ALKPHOS 87  BILITOT 0.7  PROT 5.7*  ALBUMIN 1.8*   No results for input(s): LIPASE, AMYLASE in the last 168 hours. Recent Labs  Lab 01/03/24 1203  AMMONIA 55*    ABG    Component Value Date/Time   PHART 7.472 (H) 01/05/2024 1351   PCO2ART 25.1 (L) 01/05/2024 1351   PO2ART 188 (H) 01/05/2024 1351   HCO3 18.4 (L) 01/05/2024 1351   TCO2 19 (L) 01/05/2024 1351   ACIDBASEDEF 4.0 (H) 01/05/2024 1351   O2SAT 100 01/05/2024 1351     Coagulation Profile: No results for input(s): INR, PROTIME in the last 168 hours.  Cardiac Enzymes: Recent Labs  Lab 01/03/24 1119 01/04/24 0446 01/05/24 0426 01/06/24 0336  CKTOTAL 1,475* 1,717* 619* 371  CKMB 32.2*  --   --   --     HbA1C: Hgb A1c MFr Bld  Date/Time Value Ref Range Status  01/03/2024 11:19 AM 9.3 (H) 4.8 - 5.6 % Final    Comment:    (NOTE) Diagnosis of Diabetes The following HbA1c ranges recommended by the American Diabetes Association (ADA) may be used as an aid in the diagnosis of diabetes  mellitus.  Hemoglobin             Suggested A1C NGSP%              Diagnosis  <5.7                   Non Diabetic  5.7-6.4                Pre-Diabetic  >6.4                   Diabetic  <7.0                   Glycemic control for                       adults with diabetes.      CBG: Recent Labs  Lab 01/05/24 1059 01/05/24 1601 01/05/24 1937 01/05/24 2346 01/06/24 0338  GLUCAP 303* 291* 257* 298* 249*    Review of Systems:   See HPI  Past Medical History:  He,  has no past medical history on file.   Surgical History:  History reviewed. No pertinent surgical history.   Social History:      Family History:  His family history is not on file.   Allergies No Known Allergies   Home Medications  Prior to Admission medications   Medication Sig Start Date End Date Taking? Authorizing Provider  furosemide (LASIX) 20 MG tablet Take 20 mg by mouth daily as needed. 12/28/23  Yes [provider]  gabapentin (NEURONTIN) 300 MG capsule Take 300 mg by mouth 3 (three) times daily. 12/28/23  Yes [provider]  LANTUS SOLOSTAR 100 UNIT/ML Solostar Pen Inject 50 Units into the skin daily. 12/28/23  Yes [provider]  losartan-hydrochlorothiazide (HYZAAR) 100-12.5 MG tablet Take 1 tablet by mouth daily. 12/28/23  Yes [provider]  metFORMIN (GLUCOPHAGE) 500 MG tablet Take 500 mg by mouth 2 (two) times daily. 12/28/23  Yes [provider]    Lauraine Norse, DO [facility], PGY-2 01/06/24 7:40 AM

## 2024-01-06 NOTE — Inpatient Diabetes Management (Signed)
 Inpatient Diabetes Program Recommendations  AACE/ADA: New Consensus Statement on Inpatient Glycemic Control (2015)  Target Ranges:  Prepandial:   less than 140 mg/dL      Peak postprandial:   less than 180 mg/dL (1-2 hours)      Critically ill patients:  140 - 180 mg/dL   Lab Results  Component Value Date   GLUCAP 195 (H) 01/06/2024   HGBA1C 9.3 (H) 01/03/2024    Review of Glycemic Control  Latest Reference Range & Units 01/05/24 23:46 01/06/24 03:38 01/06/24 07:48  Glucose-Capillary 70 - 99 mg/dL 701 (H) 750 (H) 804 (H)   Diabetes history: DM 2 Outpatient Diabetes medications:  Lantus 50 units daily Metformin 500 mg bid  Current orders for Inpatient glycemic control:  Novolog 0-9 units q 4 hours Osmolite 65 ml/hr Lantus 10 units bid  Inpatient Diabetes Program Recommendations:    Note that Lantus increased today.  Consider adding tube feed coverage as well.   Thanks,  Randall Bullocks, RN, BC-ADM Inpatient Diabetes Coordinator Pager [phone]  (8a-5p)

## 2024-01-06 NOTE — Interval H&P Note (Signed)
 History and Physical Interval Note:  Patient admitted with acute encephalopathy, hypoxemic respiratory failure, renal failure  Admitted to the ICU as documented in notes by Dr. Rolan Sharps  Steven Christensen

## 2024-01-06 NOTE — Progress Notes (Signed)
 Palliative Medicine Progress Note   Patient Name: Steven Christensen       Date: 01/06/2024 DOB: February 20, 1971  Age: 53 y.o. MRN#: [id number] Attending Physician: Neda Jennet LABOR, MD Primary Care Physician: Shelda Atlas, MD Admit Date: 01/03/2024   HPI/Patient Profile: 53 y.o. male  with past medical history of polysubstance abuse, associated schizoaffective disorder, and diabetes who presented on 01/03/2024 with progressive altered mental status.  On EMS arrival, he was found obtunded and hypoglycemic.  Intubated in the ED for airway protection.  Initial workup showed AKI, hypokalemia, and positive cocaine.  He is admitted with anoxic encephalopathy, acute respiratory failure, and acute renal failure.   Palliative Medicine has been consulted for goals of care discussions and complex medical decision making.   Subjective: Chart reviewed.  Update received from RN.  Patient is currently weaning on the ventilator. RN reports he has a gag/cough and has been grimacing with mouth care.    Discussion: I met today with patient's mother Naomie.  Provided updates on patient's current clinical status as per above.  Ongoing conversation was had regarding the seriousness of patient's current medical situation and his guarded prognosis.  We discussed the possibility that patient's mental status could improve enough that he may be able to come off the ventilator.  Naomie is agreeable to continue current supportive care, allowing more time for outcomes.  She shares that she is hopeful for improvement (due to her strong faith in God), but states that she is also realistic and understands there may not be a good outcome. If patient's condition improve, or was to deteriorate, she indicates that she would make the  difficult decision to remove him from ventilator support.   Questions and concerns were addressed. Hard choices book was provided.   Objective:  Physical Exam Vitals reviewed.  Constitutional:      General: He is not in acute distress.    Comments: Critically ill-appearing  Cardiovascular:     Rate and Rhythm: Normal rate.  Pulmonary:     Comments: Intubated             Palliative Medicine Assessment & Plan   Assessment: Principal Problem:   Acute metabolic encephalopathy    Recommendations/Plan: Continue current supportive care Allow time for outcomes Low threshold to transition to comfort if patient does not improve, or  if his condition were to deteriorate Ongoing palliative support  Primary Decision Maker: NEXT OF KIN - mother/Dorothy   Existing Vynca/ACP Documentation: None   Code Status/Advance Care Planning: DNR with interventions   Prognosis:  Unable to determine  Discharge Planning: To Be Determined   Thank you for allowing the Palliative Medicine Team to assist in the care of this patient.   Time: 52 minutes  Detailed review of medical records (labs, imaging, vital signs), medically appropriate exam, discussed with treatment team, counseling and education to patient, family, & staff, documenting clinical information, coordination of care.   Signed: Aniyah Nobis B Lowery Paullin, NP   Please contact Palliative Medicine Team phone at [phone] for questions and concerns.  For individual providers, please see AMION.

## 2024-01-06 NOTE — Progress Notes (Signed)
  Progress Note   Date: 01/05/2024  Patient Name: Steven Christensen        MRN#: [id number]  Clarification of the diagnosis of diabetes:   Demand ischemia without a Myocardial Infarction

## 2024-01-06 NOTE — Plan of Care (Signed)
 Patient vitals remained stable today.  On CPAP mode for vent and moving mouth with oral stimulation.  No movement to command or limbs with painful stimuli .

## 2024-01-06 NOTE — Progress Notes (Signed)
   Progress Note   Date: 01/05/2024  Patient Name: Steven Christensen        MRN#: [id number]   Clarification of the diagnosis of pressure ulcer(s):   Pressure injury sacrum, bottom, forehead, present on admission (at the time of the admission order)

## 2024-01-07 DIAGNOSIS — R21 Rash and other nonspecific skin eruption: Secondary | ICD-10-CM | POA: Diagnosis not present

## 2024-01-07 DIAGNOSIS — J9601 Acute respiratory failure with hypoxia: Secondary | ICD-10-CM | POA: Diagnosis not present

## 2024-01-07 DIAGNOSIS — Z515 Encounter for palliative care: Secondary | ICD-10-CM | POA: Diagnosis not present

## 2024-01-07 DIAGNOSIS — Z7189 Other specified counseling: Secondary | ICD-10-CM | POA: Diagnosis not present

## 2024-01-07 DIAGNOSIS — N179 Acute kidney failure, unspecified: Secondary | ICD-10-CM | POA: Diagnosis not present

## 2024-01-07 DIAGNOSIS — J9691 Respiratory failure, unspecified with hypoxia: Secondary | ICD-10-CM | POA: Diagnosis not present

## 2024-01-07 DIAGNOSIS — G9341 Metabolic encephalopathy: Secondary | ICD-10-CM | POA: Diagnosis not present

## 2024-01-07 LAB — GLUCOSE, CAPILLARY
Glucose-Capillary: 190 mg/dL — ABNORMAL HIGH (ref 70–99)
Glucose-Capillary: 172 mg/dL — ABNORMAL HIGH (ref 70–99)
Glucose-Capillary: 218 mg/dL — ABNORMAL HIGH (ref 70–99)
Glucose-Capillary: 234 mg/dL — ABNORMAL HIGH (ref 70–99)
Glucose-Capillary: 213 mg/dL — ABNORMAL HIGH (ref 70–99)
Glucose-Capillary: 253 mg/dL — ABNORMAL HIGH (ref 70–99)

## 2024-01-07 LAB — CBC
HCT: 34.8 % — ABNORMAL LOW (ref 39.0–52.0)
Hemoglobin: 11.5 g/dL — ABNORMAL LOW (ref 13.0–17.0)
MCH: 30.6 pg (ref 26.0–34.0)
MCHC: 33 g/dL (ref 30.0–36.0)
MCV: 92.6 fL (ref 80.0–100.0)
Platelets: 184 K/uL (ref 150–400)
RBC: 3.76 MIL/uL — ABNORMAL LOW (ref 4.22–5.81)
RDW: 15.9 % — ABNORMAL HIGH (ref 11.5–15.5)
WBC: 10.2 K/uL (ref 4.0–10.5)
nRBC: 0 % (ref 0.0–0.2)

## 2024-01-07 LAB — BASIC METABOLIC PANEL WITH GFR
Anion gap: 8 (ref 5–15)
BUN: 68 mg/dL — ABNORMAL HIGH (ref 6–20)
CO2: 21 mmol/L — ABNORMAL LOW (ref 22–32)
Calcium: 7.7 mg/dL — ABNORMAL LOW (ref 8.9–10.3)
Chloride: 120 mmol/L — ABNORMAL HIGH (ref 98–111)
Creatinine, Ser: 5.28 mg/dL — ABNORMAL HIGH (ref 0.61–1.24)
GFR, Estimated: 12 mL/min — ABNORMAL LOW (ref >60)
Glucose, Bld: 213 mg/dL — ABNORMAL HIGH (ref 70–99)
Potassium: 3.2 mmol/L — ABNORMAL LOW (ref 3.5–5.1)
Sodium: 149 mmol/L — ABNORMAL HIGH (ref 135–145)

## 2024-01-07 LAB — TRIGLYCERIDES: Triglycerides: 44 mg/dL (ref <150)

## 2024-01-07 LAB — PHOSPHORUS: Phosphorus: 4.9 mg/dL — ABNORMAL HIGH (ref 2.5–4.6)

## 2024-01-07 MED ORDER — SCOPOLAMINE 1 MG/3DAYS TD PT72
1.0000 | MEDICATED_PATCH | TRANSDERMAL | Status: DC
Start: 2024-01-07 — End: 2024-01-15
  Administered 2024-01-07 – 2024-01-13 (×6): 1 mg via TRANSDERMAL
  Filled 2024-01-07 (×3): qty 1

## 2024-01-07 MED ORDER — FUROSEMIDE 10 MG/ML IJ SOLN
40.0000 mg | Freq: Once | INTRAMUSCULAR | Status: AC
  Administered 2024-01-07 (×2): 40 mg via INTRAVENOUS
  Filled 2024-01-07: qty 4

## 2024-01-07 MED ORDER — AMLODIPINE BESYLATE 10 MG PO TABS
10.0000 mg | ORAL_TABLET | Freq: Every day | ORAL | Status: DC
  Administered 2024-01-07 – 2024-01-15 (×18): 10 mg
  Filled 2024-01-07 (×9): qty 1

## 2024-01-07 MED ORDER — LABETALOL HCL 5 MG/ML IV SOLN
10.0000 mg | Freq: Once | INTRAVENOUS | Status: AC
  Administered 2024-01-07 (×2): 10 mg via INTRAVENOUS
  Filled 2024-01-07: qty 4

## 2024-01-07 MED ORDER — SODIUM CHLORIDE 0.9 % IV SOLN
2.0000 g | INTRAVENOUS | Status: AC
  Administered 2024-01-07 – 2024-01-09 (×6): 2 g via INTRAVENOUS
  Filled 2024-01-07 (×3): qty 20

## 2024-01-07 MED ORDER — POTASSIUM CHLORIDE 20 MEQ PO PACK
40.0000 meq | PACK | Freq: Once | ORAL | Status: AC
  Administered 2024-01-07 (×2): 40 meq
  Filled 2024-01-07: qty 2

## 2024-01-07 MED ORDER — FREE WATER
300.0000 mL | Status: DC
  Administered 2024-01-07 – 2024-01-08 (×12): 300 mL

## 2024-01-07 MED ORDER — INSULIN GLARGINE 100 UNIT/ML ~~LOC~~ SOLN
12.0000 [IU] | Freq: Two times a day (BID) | SUBCUTANEOUS | Status: DC
  Administered 2024-01-07 – 2024-01-08 (×4): 12 [IU] via SUBCUTANEOUS
  Filled 2024-01-07 (×3): qty 0.12

## 2024-01-07 MED ORDER — FUROSEMIDE 10 MG/ML IJ SOLN
60.0000 mg | Freq: Once | INTRAMUSCULAR | Status: AC
  Administered 2024-01-07 (×2): 60 mg via INTRAVENOUS
  Filled 2024-01-07: qty 6

## 2024-01-07 MED ORDER — FREE WATER: 300.0000 mL | Freq: Three times a day (TID) | Status: DC

## 2024-01-07 NOTE — Progress Notes (Signed)
 eLink Physician-Brief Progress Note Patient Name: Steven Christensen DOB: 1970/10/14 MRN: [id number]   Date of Service  01/07/2024  HPI/Events of Note  Persistent hypertension despite escalation of antihypertensives with initiation of amlodipine  Hydralazine given at 2040, diuresis today with positive fluid balance  eICU Interventions  Repeat Lasix, one-time labetalol     Intervention Category Intermediate Interventions: Hypertension - evaluation and management  [name] 01/07/2024, 11:01 PM

## 2024-01-07 NOTE — Progress Notes (Signed)
 NAME:  Steven Christensen, MRN:  [id number], DOB:  10/09/1970, LOS: 4 ADMISSION DATE:  01/03/2024, CONSULTATION DATE:  01/03/24 REFERRING MD:  EDP, CHIEF COMPLAINT:  AMS   History of Present Illness:  53 year old man w/ hx of polysubstance abuse, associated schizoaffective disorder, and DM with not feeling well since 9/28 with vomiting with progressive AMS.  Found obtunded by EMS and hypoglycemic.  Intubated for airway protection.  AKI, hypokalemic, +cocaine.  Pan scan done and PCCM consulted.   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  Non compliant with most of his meds.  Pertinent  Medical History  Schizophrenia Polysubstance abuse- tobacco, cocaine, alcohol DM Suicide attempts  Significant Hospital Events: Including procedures, antibiotic start and stop dates in addition to other pertinent events   9/30 admit, intubated  10/4-Tolerated weaning for about an hour  Interim History / Subjective:  No significant overnight events  Objective    Blood pressure (!) 171/82, pulse 74, temperature (!) 97.5 F (36.4 C), resp. rate (!) 5, height 6' 4 (1.93 m), weight 115.3 kg, SpO2 99%. CVP:  [4 mmHg-44 mmHg] 9 mmHg  Vent Mode: PSV;CPAP FiO2 (%):  [40 %] 40 % Set Rate:  [16 bmp] 16 bmp Vt Set:  [690 mL] 690 mL PEEP:  [5 cmH20] 5 cmH20 Pressure Support:  [5 cmH20] 5 cmH20 Plateau Pressure:  [18 cmH20-20 cmH20] 20 cmH20   Intake/Output Summary (Last 24 hours) at 01/07/2024 1011 Last data filed at 01/07/2024 0800 Gross per 24 hour  Intake 3497.21 ml  Output 1235 ml  Net 2262.21 ml   Filed Weights   01/04/24 0450 01/05/24 0500 01/07/24 0500  Weight: 110.8 kg 114.5 kg 115.3 kg    Examination: General: Chronically ill-appearing, unresponsive HENT: Endotracheal tube in place pupils about 2 mm not reactive Lungs: Coarse breath sounds Cardiovascular: S1-S2 appreciated Abdomen: Bowel sounds appreciated,  soft. Neuro: Pupils nonreactive, does withdraw left LE from pain.  I reviewed last 24 h vitals and pain scores, last 48 h intake and output, last 24 h labs and trends, and last 24 h imaging results.  Resolved problem list   Assessment and Plan   Anoxic encephalopathy Metabolic encephalopathy History of polysubstance abuse -Not responsive to stimulus - Neuroprotective measures- normothermia, euglycemia, HOB greater than 30, head in neutral alignment, normocapnia, normoxia -Seizure precautions -Remains off sedation -Did not tolerate weaning today, for about an hour-put back on full support with increased work of breathing, tachypnea Overall prognosis is poor with MRI showing evidence of anoxic injury - Will plan repeat MRI 01/08/2024-p.m. - Consulted palliative care to assist with ongoing goals of care discussions  Acute hypoxemic respiratory failure Aspiration pneumonia Sepsis secondary to aspiration pneumonia - continue full MV support -Target TVol 6-8cc/kgIBW -Target Plateau Pressure < 30cm H20 -Target driving pressure less than 15 cm of water -Target PaO2 55-65: titrate PEEP/FiO2 per protocol -Ventilator associated pneumonia prevention protocol - On Zosyn and linezolid-May be stopped at day 7-today is 5/7  Right upper extremity blistering - Continue wound care   Acute renal failure Electrolyte derangement Elevated CK - trending down -Continue to monitor electrolytes - Avoid nephrotoxic medications - Renal dose medications - Ensure adequate renal perfusion  About 14 L positive -Was aggressively fluid resuscitated in the context of rhabdomyolysis - Will give dose of 40 of Lasix today  Hypoglycemia - Continue monitoring CBGs - Continue tube feeds  History of paranoid schizophrenia Was  not compliant with medications  Hypernatremia - Add free water Hypokalemia - Replete  Hypertension - Start amlodipine at 10  Labs   CBC: Recent Labs  Lab 01/03/24 1119  01/03/24 1137 01/04/24 0446 01/04/24 0942 01/05/24 0426 01/05/24 1351 01/06/24 0336 01/07/24 0418  WBC 19.6*  --  17.7*  --  14.1*  --  12.7* 10.2  NEUTROABS 15.7*  --   --   --   --   --   --   --   HGB 14.7   < > 13.4 10.9* 11.3* 10.9* 10.7* 11.5*  HCT 43.7   < > 38.8* 32.0* 32.3* 32.0* 31.6* 34.8*  MCV 90.9  --  88.2  --  88.7  --  91.3 92.6  PLT 335  --  281  --  212  --  188 184   < > = values in this interval not displayed.    Basic Metabolic Panel: Recent Labs  Lab 01/04/24 0446 01/04/24 0942 01/04/24 1156 01/04/24 1711 01/05/24 0426 01/05/24 1351 01/05/24 1756 01/06/24 0336 01/07/24 0418  NA 145   < > 145 143 146* 145  --  148* 149*  K 2.7*   < > 3.3* 3.2* 3.5 3.8  --  3.2* 3.2*  CL 110  --  111 111 114*  --   --  118* 120*  CO2 19*  --  18* 17* 18*  --   --  20* 21*  GLUCOSE 126*  --  181* 236* 286*  --   --  249* 213*  BUN 41*  --  44* 47* 51*  --   --  61* 68*  CREATININE 5.09*  --  5.38* 5.35* 5.62*  --   --  5.62* 5.28*  CALCIUM 7.4*  --  7.3* 7.2* 7.6*  --   --  7.6* 7.7*  MG 1.7  --   --  2.0 2.0  --  2.2 2.1  --   PHOS 6.9*  --   --   --  5.2*  --   --  5.3* 4.9*   < > = values in this interval not displayed.   GFR: Estimated Creatinine Clearance: 22 mL/min (A) (by C-G formula based on SCr of 5.28 mg/dL (H)). Recent Labs  Lab 01/03/24 1130 01/03/24 1640 01/04/24 0446 01/05/24 0426 01/06/24 0336 01/07/24 0418  WBC  --   --  17.7* 14.1* 12.7* 10.2  LATICACIDVEN 2.7* 1.9  --   --   --   --     Liver Function Tests: Recent Labs  Lab 01/03/24 1119  AST 45*  ALT 22  ALKPHOS 87  BILITOT 0.7  PROT 5.7*  ALBUMIN 1.8*   No results for input(s): LIPASE, AMYLASE in the last 168 hours. Recent Labs  Lab 01/03/24 1203  AMMONIA 55*    ABG    Component Value Date/Time   PHART 7.472 (H) 01/05/2024 1351   PCO2ART 25.1 (L) 01/05/2024 1351   PO2ART 188 (H) 01/05/2024 1351   HCO3 18.4 (L) 01/05/2024 1351   TCO2 19 (L) 01/05/2024 1351    ACIDBASEDEF 4.0 (H) 01/05/2024 1351   O2SAT 100 01/05/2024 1351     Coagulation Profile: No results for input(s): INR, PROTIME in the last 168 hours.  Cardiac Enzymes: Recent Labs  Lab 01/03/24 1119 01/04/24 0446 01/05/24 0426 01/06/24 0336  CKTOTAL 1,475* 1,717* 619* 371  CKMB 32.2*  --   --   --     HbA1C: Hgb A1c MFr Bld  Date/Time Value Ref Range Status  01/03/2024 11:19 AM 9.3 (H) 4.8 - 5.6 % Final    Comment:    (NOTE) Diagnosis of Diabetes The following HbA1c ranges recommended by the American Diabetes Association (ADA) may be used as an aid in the diagnosis of diabetes mellitus.  Hemoglobin             Suggested A1C NGSP%              Diagnosis  <5.7                   Non Diabetic  5.7-6.4                Pre-Diabetic  >6.4                   Diabetic  <7.0                   Glycemic control for                       adults with diabetes.      CBG: Recent Labs  Lab 01/06/24 1524 01/06/24 1930 01/06/24 2306 01/07/24 0330 01/07/24 0824  GLUCAP 213* 192* 259* 190* 172*    Review of Systems:   See HPI  Past Medical History:  He,  has no past medical history on file.   Surgical History:  History reviewed. No pertinent surgical history.   Social History:      Family History:  His family history is not on file.   Allergies No Known Allergies   The patient is critically ill with multiple organ systems failure and requires high complexity decision making for assessment and support, frequent evaluation and titration of therapies, application of advanced monitoring technologies and extensive interpretation of multiple databases. Critical Care Time devoted to patient care services described in this note independent of APP/resident time (if applicable)  is 35 minutes.   Jennet Epley MD [facility] Personal pager: See Amion If unanswered, please page CCM On-call: #[phone]

## 2024-01-07 NOTE — Progress Notes (Signed)
 Palliative Medicine Progress Note   Patient Name: Steven Christensen       Date: 01/07/2024 DOB: 21-Apr-1970  Age: 53 y.o. MRN#: [id number] Attending Physician: Neda Jennet LABOR, MD Primary Care Physician: Shelda Atlas, MD Admit Date: 01/03/2024   HPI/Patient Profile: 53 y.o. male  with past medical history of polysubstance abuse, associated schizoaffective disorder, and diabetes who presented on 01/03/2024 with progressive altered mental status.  On EMS arrival, he was found obtunded and hypoglycemic.  Intubated in the ED for airway protection.  Initial workup showed AKI, hypokalemia, and positive cocaine.  He is admitted with anoxic encephalopathy, acute respiratory failure, and acute renal failure.   Palliative Medicine has been consulted for goals of care discussions and complex medical decision making.   Subjective: Chart reviewed. Update received from RN. Patient assessed. Patient did not tolerate SBT this morning. Per RN he is minimally responsive; and only intermittently grimaces with oral care.   No family at bedside. I attempted to call mother, but she fid not answer.     Objective:  Physical Exam Vitals reviewed.  Constitutional:      Comments: Critically ill-appearing  Cardiovascular:     Rate and Rhythm: Normal rate.  Pulmonary:     Comments: Intubated Neurological:     Mental Status: He is unresponsive.              Palliative Medicine Assessment & Plan   Assessment: Principal Problem:   Acute metabolic encephalopathy    Recommendations/Plan: Continue current supportive care Allow time for outcomes Low threshold to transition to comfort if patient does not improve, or if his condition were to deteriorate Ongoing palliative support   I will follow-up when I  return to service on Tuesday 10/7.  If there are urgent needs before then, please call the team phone [phone] or secure chat the providers listed on Amion.   Primary Decision Maker: NEXT OF KIN - mother/Steven Christensen   Existing Vynca/ACP Documentation: None   Code Status/Advance Care Planning: DNR with interventions  Prognosis:  Unable to determine  Discharge Planning: To Be Determined   Thank you for allowing the Palliative Medicine Team to assist in the care of this patient.   Time: 28 minutes   Recardo KATHEE Loll, NP   Please contact Palliative Medicine Team phone at [phone] for questions and  concerns.  For individual providers, please see AMION.

## 2024-01-07 NOTE — Plan of Care (Signed)
  Problem: Fluid Volume: Goal: Ability to maintain a balanced intake and output will improve Outcome: Progressing   Problem: Nutritional: Goal: Maintenance of adequate nutrition will improve Outcome: Progressing   Problem: Skin Integrity: Goal: Risk for impaired skin integrity will decrease Outcome: Progressing   Problem: Tissue Perfusion: Goal: Adequacy of tissue perfusion will improve Outcome: Progressing

## 2024-01-08 ENCOUNTER — Inpatient Hospital Stay (HOSPITAL_COMMUNITY): Payer: MEDICAID

## 2024-01-08 DIAGNOSIS — R21 Rash and other nonspecific skin eruption: Secondary | ICD-10-CM | POA: Diagnosis not present

## 2024-01-08 DIAGNOSIS — N179 Acute kidney failure, unspecified: Secondary | ICD-10-CM | POA: Diagnosis not present

## 2024-01-08 DIAGNOSIS — G9341 Metabolic encephalopathy: Secondary | ICD-10-CM | POA: Diagnosis not present

## 2024-01-08 DIAGNOSIS — J9601 Acute respiratory failure with hypoxia: Secondary | ICD-10-CM | POA: Diagnosis not present

## 2024-01-08 LAB — CBC WITH DIFFERENTIAL/PLATELET
Abs Immature Granulocytes: 0.03 K/uL (ref 0.00–0.07)
Basophils Absolute: 0 K/uL (ref 0.0–0.1)
Basophils Relative: 0 %
Eosinophils Absolute: 0.3 K/uL (ref 0.0–0.5)
Eosinophils Relative: 3 %
HCT: 37.9 % — ABNORMAL LOW (ref 39.0–52.0)
Hemoglobin: 12.3 g/dL — ABNORMAL LOW (ref 13.0–17.0)
Immature Granulocytes: 0 %
Lymphocytes Relative: 16 %
Lymphs Abs: 1.6 K/uL (ref 0.7–4.0)
MCH: 30.3 pg (ref 26.0–34.0)
MCHC: 32.5 g/dL (ref 30.0–36.0)
MCV: 93.3 fL (ref 80.0–100.0)
Monocytes Absolute: 0.6 K/uL (ref 0.1–1.0)
Monocytes Relative: 7 %
Neutro Abs: 7.3 K/uL (ref 1.7–7.7)
Neutrophils Relative %: 74 %
Platelets: 187 K/uL (ref 150–400)
RBC: 4.06 MIL/uL — ABNORMAL LOW (ref 4.22–5.81)
RDW: 15.9 % — ABNORMAL HIGH (ref 11.5–15.5)
WBC: 9.9 K/uL (ref 4.0–10.5)
nRBC: 0 % (ref 0.0–0.2)

## 2024-01-08 LAB — CULTURE, BLOOD (ROUTINE X 2)
Culture: NO GROWTH
Culture: NO GROWTH

## 2024-01-08 LAB — GLUCOSE, CAPILLARY
Glucose-Capillary: 170 mg/dL — ABNORMAL HIGH (ref 70–99)
Glucose-Capillary: 172 mg/dL — ABNORMAL HIGH (ref 70–99)
Glucose-Capillary: 174 mg/dL — ABNORMAL HIGH (ref 70–99)
Glucose-Capillary: 231 mg/dL — ABNORMAL HIGH (ref 70–99)
Glucose-Capillary: 231 mg/dL — ABNORMAL HIGH (ref 70–99)
Glucose-Capillary: 237 mg/dL — ABNORMAL HIGH (ref 70–99)

## 2024-01-08 LAB — COMPREHENSIVE METABOLIC PANEL WITH GFR
ALT: 14 U/L (ref 0–44)
AST: 33 U/L (ref 15–41)
Albumin: 1.8 g/dL — ABNORMAL LOW (ref 3.5–5.0)
Alkaline Phosphatase: 58 U/L (ref 38–126)
Anion gap: 11 (ref 5–15)
BUN: 71 mg/dL — ABNORMAL HIGH (ref 6–20)
CO2: 21 mmol/L — ABNORMAL LOW (ref 22–32)
Calcium: 8 mg/dL — ABNORMAL LOW (ref 8.9–10.3)
Chloride: 117 mmol/L — ABNORMAL HIGH (ref 98–111)
Creatinine, Ser: 4.64 mg/dL — ABNORMAL HIGH (ref 0.61–1.24)
GFR, Estimated: 14 mL/min — ABNORMAL LOW (ref >60)
Glucose, Bld: 243 mg/dL — ABNORMAL HIGH (ref 70–99)
Potassium: 3.4 mmol/L — ABNORMAL LOW (ref 3.5–5.1)
Sodium: 149 mmol/L — ABNORMAL HIGH (ref 135–145)
Total Bilirubin: 0.4 mg/dL (ref 0.0–1.2)
Total Protein: 4.9 g/dL — ABNORMAL LOW (ref 6.5–8.1)

## 2024-01-08 LAB — RENAL FUNCTION PANEL
Albumin: 1.6 g/dL — ABNORMAL LOW (ref 3.5–5.0)
Anion gap: 10 (ref 5–15)
BUN: 68 mg/dL — ABNORMAL HIGH (ref 6–20)
CO2: 20 mmol/L — ABNORMAL LOW (ref 22–32)
Calcium: 7.7 mg/dL — ABNORMAL LOW (ref 8.9–10.3)
Chloride: 118 mmol/L — ABNORMAL HIGH (ref 98–111)
Creatinine, Ser: 4.17 mg/dL — ABNORMAL HIGH (ref 0.61–1.24)
GFR, Estimated: 16 mL/min — ABNORMAL LOW (ref >60)
Glucose, Bld: 174 mg/dL — ABNORMAL HIGH (ref 70–99)
Phosphorus: 5 mg/dL — ABNORMAL HIGH (ref 2.5–4.6)
Potassium: 3.6 mmol/L (ref 3.5–5.1)
Sodium: 148 mmol/L — ABNORMAL HIGH (ref 135–145)

## 2024-01-08 LAB — MAGNESIUM: Magnesium: 2.2 mg/dL (ref 1.7–2.4)

## 2024-01-08 LAB — PHOSPHORUS: Phosphorus: 4.9 mg/dL — ABNORMAL HIGH (ref 2.5–4.6)

## 2024-01-08 MED ORDER — INSULIN GLARGINE 100 UNIT/ML ~~LOC~~ SOLN
15.0000 [IU] | Freq: Two times a day (BID) | SUBCUTANEOUS | Status: DC
  Administered 2024-01-08 – 2024-01-15 (×26): 15 [IU] via SUBCUTANEOUS
  Filled 2024-01-08 (×15): qty 0.15

## 2024-01-08 MED ORDER — FUROSEMIDE 10 MG/ML IJ SOLN
40.0000 mg | Freq: Two times a day (BID) | INTRAMUSCULAR | Status: DC
  Administered 2024-01-08 – 2024-01-15 (×30): 40 mg via INTRAVENOUS
  Filled 2024-01-08 (×15): qty 4

## 2024-01-08 MED ORDER — LABETALOL HCL 200 MG PO TABS
100.0000 mg | ORAL_TABLET | Freq: Two times a day (BID) | ORAL | Status: DC
  Administered 2024-01-08 – 2024-01-09 (×6): 100 mg
  Filled 2024-01-08 (×3): qty 1

## 2024-01-08 MED ORDER — CHLORHEXIDINE GLUCONATE CLOTH 2 % EX PADS
6.0000 | MEDICATED_PAD | Freq: Every day | CUTANEOUS | Status: DC
  Administered 2024-01-10 – 2024-01-14 (×12): 6 via TOPICAL

## 2024-01-08 MED ORDER — POTASSIUM CHLORIDE 20 MEQ PO PACK
40.0000 meq | PACK | Freq: Once | ORAL | Status: AC
  Administered 2024-01-08 (×2): 40 meq
  Filled 2024-01-08: qty 2

## 2024-01-08 MED ORDER — FREE WATER
200.0000 mL | Status: DC
  Administered 2024-01-08 – 2024-01-12 (×48): 200 mL

## 2024-01-08 MED ORDER — INSULIN ASPART 100 UNIT/ML IJ SOLN
3.0000 [IU] | INTRAMUSCULAR | Status: DC
  Administered 2024-01-08 – 2024-01-15 (×72): 3 [IU] via SUBCUTANEOUS

## 2024-01-08 NOTE — Progress Notes (Signed)
 NAME:  Steven Christensen, MRN:  [id number], DOB:  08-Nov-1970, LOS: 5 ADMISSION DATE:  01/03/2024, CONSULTATION DATE:  01/03/24 REFERRING MD:  EDP, CHIEF COMPLAINT:  AMS   History of Present Illness:  53 year old man w/ hx of polysubstance abuse, associated schizoaffective disorder, and DM with not feeling well since 9/28 with vomiting with progressive AMS.  Found obtunded by EMS and hypoglycemic.  Intubated for airway protection.  AKI, hypokalemic, +cocaine.  Pan scan done and PCCM consulted.   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  Non compliant with most of his meds.  Pertinent  Medical History  Schizophrenia Polysubstance abuse- tobacco, cocaine, alcohol DM Suicide attempts  Significant Hospital Events: Including procedures, antibiotic start and stop dates in addition to other pertinent events   9/30 admit, intubated  10/4-Tolerated weaning for about an hour 10/5 uncontrolled hypertension, tolerating weaning  Interim History / Subjective:  No significant acute events Difficulty controlling blood pressure  Objective    Blood pressure (!) 167/81, pulse 76, temperature 98.1 F (36.7 C), resp. rate (!) 0, height 6' 4 (1.93 m), weight 115.1 kg, SpO2 99%. CVP:  [0 mmHg-30 mmHg] 3 mmHg  Vent Mode: PRVC FiO2 (%):  [40 %] 40 % Set Rate:  [16 bmp] 16 bmp Vt Set:  [690 mL] 690 mL PEEP:  [5 cmH20] 5 cmH20 Plateau Pressure:  [17 cmH20-18 cmH20] 18 cmH20   Intake/Output Summary (Last 24 hours) at 01/08/2024 1033 Last data filed at 01/08/2024 0900 Gross per 24 hour  Intake 3741.14 ml  Output 2135 ml  Net 1606.14 ml   Filed Weights   01/05/24 0500 01/07/24 0500 01/08/24 0316  Weight: 114.5 kg 115.3 kg 115.1 kg    Examination: General: Chronically ill-appearing, unresponsive HENT: Endotracheal tube in place, Lungs: Coarse breath sounds Cardiovascular: S1-S2 appreciated Abdomen: Bowel sounds  appreciated, soft. Neuro: Pupils nonreactive, does not withdraw to pain, tolerating weaning  I reviewed last 24 h vitals and pain scores, last 48 h intake and output, last 24 h labs and trends, and last 24 h imaging results.  Resolved problem list   Assessment and Plan   Anoxic encephalopathy Metabolic encephalopathy History of polysubstance abuse - Not responsive to painful stimuli, not responsive to verbal stimuli - Continue neuroprotective measures- normothermia, euglycemia, HOB greater than 30, head in neutral alignment, normocapnia, normoxia - Seizure precautions - He remains off sedation at present - Currently on weaning 13/5 Overall prognosis is poor with MRI showing evidence of anoxic injury - Plan for repeat MRI today - Appreciate palliative care involvement - Will continue to have goals of care discussions with patient's mother  Acute hypoxemic respiratory failure Aspiration pneumonia Sepsis secondary to aspiration pneumonia - Remains on antibiotics, day 6/7-Zosyn, linezolid - continue full MV support -Target TVol 6-8cc/kgIBW -Target Plateau Pressure < 30cm H20 -Target driving pressure less than 15 cm of water -Target PaO2 55-65: titrate PEEP/FiO2 per protocol -Ventilator associated pneumonia prevention protocol  Right upper extremity blistering - Continue wound care   Acute renal failure Electrolyte derangements - Continue to monitor electrolytes - Avoid nephrotoxic medications - Renal dose medications - Ensure adequate renal perfusion -Creatinine improving -Will continue diuresis, will place Lasix 40 twice a day  Hypoglycemia - Continue CBG monitoring - Continue tube feeds  History of paranoid schizophrenia - Was not compliant with meds at baseline  Hypernatremia - Added free water was on 300 every 4, decreased to  200 every 4 - Diuresis may help with natriuresis  Hypokalemia - Replete  Hypertension - On amlodipine - Add Normodyne 100 twice  daily  Labs   CBC: Recent Labs  Lab 01/03/24 1119 01/03/24 1137 01/04/24 0446 01/04/24 0942 01/05/24 0426 01/05/24 1351 01/06/24 0336 01/07/24 0418 01/08/24 0308  WBC 19.6*  --  17.7*  --  14.1*  --  12.7* 10.2 9.9  NEUTROABS 15.7*  --   --   --   --   --   --   --  7.3  HGB 14.7   < > 13.4   < > 11.3* 10.9* 10.7* 11.5* 12.3*  HCT 43.7   < > 38.8*   < > 32.3* 32.0* 31.6* 34.8* 37.9*  MCV 90.9  --  88.2  --  88.7  --  91.3 92.6 93.3  PLT 335  --  281  --  212  --  188 184 187   < > = values in this interval not displayed.    Basic Metabolic Panel: Recent Labs  Lab 01/04/24 0446 01/04/24 0942 01/04/24 1711 01/05/24 0426 01/05/24 1351 01/05/24 1756 01/06/24 0336 01/07/24 0418 01/08/24 0308  NA 145   < > 143 146* 145  --  148* 149* 149*  K 2.7*   < > 3.2* 3.5 3.8  --  3.2* 3.2* 3.4*  CL 110   < > 111 114*  --   --  118* 120* 117*  CO2 19*   < > 17* 18*  --   --  20* 21* 21*  GLUCOSE 126*   < > 236* 286*  --   --  249* 213* 243*  BUN 41*   < > 47* 51*  --   --  61* 68* 71*  CREATININE 5.09*   < > 5.35* 5.62*  --   --  5.62* 5.28* 4.64*  CALCIUM 7.4*   < > 7.2* 7.6*  --   --  7.6* 7.7* 8.0*  MG 1.7  --  2.0 2.0  --  2.2 2.1  --   --   PHOS 6.9*  --   --  5.2*  --   --  5.3* 4.9* 4.9*   < > = values in this interval not displayed.   GFR: Estimated Creatinine Clearance: 25 mL/min (A) (by C-G formula based on SCr of 4.64 mg/dL (H)). Recent Labs  Lab 01/03/24 1130 01/03/24 1640 01/04/24 0446 01/05/24 0426 01/06/24 0336 01/07/24 0418 01/08/24 0308  WBC  --   --    < > 14.1* 12.7* 10.2 9.9  LATICACIDVEN 2.7* 1.9  --   --   --   --   --    < > = values in this interval not displayed.    Liver Function Tests: Recent Labs  Lab 01/03/24 1119 01/08/24 0308  AST 45* 33  ALT 22 14  ALKPHOS 87 58  BILITOT 0.7 0.4  PROT 5.7* 4.9*  ALBUMIN 1.8* 1.8*   No results for input(s): LIPASE, AMYLASE in the last 168 hours. Recent Labs  Lab 01/03/24 1203  AMMONIA  55*    ABG    Component Value Date/Time   PHART 7.472 (H) 01/05/2024 1351   PCO2ART 25.1 (L) 01/05/2024 1351   PO2ART 188 (H) 01/05/2024 1351   HCO3 18.4 (L) 01/05/2024 1351   TCO2 19 (L) 01/05/2024 1351   ACIDBASEDEF 4.0 (H) 01/05/2024 1351   O2SAT 100 01/05/2024 1351     Coagulation Profile: No results  for input(s): INR, PROTIME in the last 168 hours.  Cardiac Enzymes: Recent Labs  Lab 01/03/24 1119 01/04/24 0446 01/05/24 0426 01/06/24 0336  CKTOTAL 1,475* 1,717* 619* 371  CKMB 32.2*  --   --   --     HbA1C: Hgb A1c MFr Bld  Date/Time Value Ref Range Status  01/03/2024 11:19 AM 9.3 (H) 4.8 - 5.6 % Final    Comment:    (NOTE) Diagnosis of Diabetes The following HbA1c ranges recommended by the American Diabetes Association (ADA) may be used as an aid in the diagnosis of diabetes mellitus.  Hemoglobin             Suggested A1C NGSP%              Diagnosis  <5.7                   Non Diabetic  5.7-6.4                Pre-Diabetic  >6.4                   Diabetic  <7.0                   Glycemic control for                       adults with diabetes.      CBG: Recent Labs  Lab 01/07/24 1534 01/07/24 1929 01/07/24 2313 01/08/24 0333 01/08/24 0813  GLUCAP 234* 213* 253* 231* 231*    Review of Systems:   See HPI  Past Medical History:  He,  has no past medical history on file.   Surgical History:  History reviewed. No pertinent surgical history.   Social History:      Family History:  His family history is not on file.   Allergies No Known Allergies   The patient is critically ill with multiple organ systems failure and requires high complexity decision making for assessment and support, frequent evaluation and titration of therapies, application of advanced monitoring technologies and extensive interpretation of multiple databases. Critical Care Time devoted to patient care services described in this note independent of APP/resident  time (if applicable)  is 34 minutes.   Jennet Epley MD [facility] Personal pager: See Amion If unanswered, please page CCM On-call: [phone]

## 2024-01-08 NOTE — Plan of Care (Signed)
  Problem: Fluid Volume: Goal: Ability to maintain a balanced intake and output will improve Outcome: Progressing   Problem: Metabolic: Goal: Ability to maintain appropriate glucose levels will improve Outcome: Progressing   Problem: Nutritional: Goal: Maintenance of adequate nutrition will improve Outcome: Progressing   Problem: Skin Integrity: Goal: Risk for impaired skin integrity will decrease Outcome: Progressing   Problem: Tissue Perfusion: Goal: Adequacy of tissue perfusion will improve Outcome: Progressing   Problem: Clinical Measurements: Goal: Will remain free from infection Outcome: Progressing Goal: Diagnostic test results will improve Outcome: Progressing Goal: Respiratory complications will improve Outcome: Progressing Goal: Cardiovascular complication will be avoided Outcome: Progressing   Problem: Activity: Goal: Risk for activity intolerance will decrease Outcome: Progressing   Problem: Nutrition: Goal: Adequate nutrition will be maintained Outcome: Progressing   Problem: Coping: Goal: Level of anxiety will decrease Outcome: Progressing   Problem: Elimination: Goal: Will not experience complications related to bowel motility Outcome: Progressing Goal: Will not experience complications related to urinary retention Outcome: Progressing   Problem: Pain Managment: Goal: General experience of comfort will improve and/or be controlled Outcome: Progressing   Problem: Safety: Goal: Ability to remain free from injury will improve Outcome: Progressing   Problem: Skin Integrity: Goal: Risk for impaired skin integrity will decrease Outcome: Progressing

## 2024-01-08 NOTE — Progress Notes (Signed)
 Pt returned to full support on the ventilator at this time due to increased work of breathing/tachypnea. Pt was able to wean on CPAP/PS 8/5 from [id number]. RT will continue to monitor and be available as needed.

## 2024-01-08 NOTE — Progress Notes (Signed)
 NAME:  Steven Christensen, MRN:  [id number], DOB:  1971-03-28, LOS: 5 ADMISSION DATE:  01/03/2024, CONSULTATION DATE:  01/03/24 REFERRING MD:  EDP, CHIEF COMPLAINT:  AMS   History of Present Illness:  53 year old man w/ hx of polysubstance abuse, associated schizoaffective disorder, and DM with not feeling well since 9/28 with vomiting with progressive AMS.  Found obtunded by EMS and hypoglycemic.  Intubated for airway protection.  AKI, hypokalemic, +cocaine.  Pan scan done and PCCM consulted.   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  Non compliant with most of his meds.  Pertinent  Medical History  Schizophrenia Polysubstance abuse- tobacco, cocaine, alcohol DM Suicide attempts  Significant Hospital Events: Including procedures, antibiotic start and stop dates in addition to other pertinent events   9/30 admit, intubated  10/4-Tolerated weaning for about an hour  Interim History / Subjective:  No significant overnight events  Objective    Blood pressure (!) 167/81, pulse 76, temperature 98.1 F (36.7 C), resp. rate (!) 0, height 6' 4 (1.93 m), weight 115.1 kg, SpO2 99%. CVP:  [0 mmHg-30 mmHg] 3 mmHg  Vent Mode: PRVC FiO2 (%):  [40 %] 40 % Set Rate:  [16 bmp] 16 bmp Vt Set:  [690 mL] 690 mL PEEP:  [5 cmH20] 5 cmH20 Plateau Pressure:  [17 cmH20-18 cmH20] 18 cmH20   Intake/Output Summary (Last 24 hours) at 01/08/2024 1016 Last data filed at 01/08/2024 0900 Gross per 24 hour  Intake 3741.14 ml  Output 2135 ml  Net 1606.14 ml   Filed Weights   01/05/24 0500 01/07/24 0500 01/08/24 0316  Weight: 114.5 kg 115.3 kg 115.1 kg    Examination: General: Chronically ill-appearing, unresponsive HENT: Endotracheal tube in place pupils about 2 mm not reactive Lungs: Coarse breath sounds Cardiovascular: S1-S2 appreciated Abdomen: Bowel sounds appreciated, soft. Neuro: Pupils nonreactive, does  withdraw left LE from pain.  I reviewed last 24 h vitals and pain scores, last 48 h intake and output, last 24 h labs and trends, and last 24 h imaging results.  Resolved problem list   Assessment and Plan   Anoxic encephalopathy Metabolic encephalopathy History of polysubstance abuse -Not responsive to stimulus - Neuroprotective measures- normothermia, euglycemia, HOB greater than 30, head in neutral alignment, normocapnia, normoxia -Seizure precautions -Remains off sedation -Did not tolerate weaning today, for about an hour-put back on full support with increased work of breathing, tachypnea Overall prognosis is poor with MRI showing evidence of anoxic injury - Will plan repeat MRI 01/08/2024-p.m. - Consulted palliative care to assist with ongoing goals of care discussions  Acute hypoxemic respiratory failure Aspiration pneumonia Sepsis secondary to aspiration pneumonia - continue full MV support -Target TVol 6-8cc/kgIBW -Target Plateau Pressure < 30cm H20 -Target driving pressure less than 15 cm of water -Target PaO2 55-65: titrate PEEP/FiO2 per protocol -Ventilator associated pneumonia prevention protocol - On Zosyn and linezolid-May be stopped at day 7-today is 5/7  Right upper extremity blistering - Continue wound care   Acute renal failure Electrolyte derangement Elevated CK - trending down -Continue to monitor electrolytes - Avoid nephrotoxic medications - Renal dose medications - Ensure adequate renal perfusion  About 14 L positive -Was aggressively fluid resuscitated in the context of rhabdomyolysis - Will give dose of 40 of Lasix today  Hypoglycemia - Continue monitoring CBGs - Continue tube feeds  History of paranoid schizophrenia Was not compliant with medications  Hypernatremia - Add  free water Hypokalemia - Replete  Hypertension - Start amlodipine at 10  Labs   CBC: Recent Labs  Lab 01/03/24 1119 01/03/24 1137 01/04/24 0446  01/04/24 0942 01/05/24 0426 01/05/24 1351 01/06/24 0336 01/07/24 0418 01/08/24 0308  WBC 19.6*  --  17.7*  --  14.1*  --  12.7* 10.2 9.9  NEUTROABS 15.7*  --   --   --   --   --   --   --  7.3  HGB 14.7   < > 13.4   < > 11.3* 10.9* 10.7* 11.5* 12.3*  HCT 43.7   < > 38.8*   < > 32.3* 32.0* 31.6* 34.8* 37.9*  MCV 90.9  --  88.2  --  88.7  --  91.3 92.6 93.3  PLT 335  --  281  --  212  --  188 184 187   < > = values in this interval not displayed.    Basic Metabolic Panel: Recent Labs  Lab 01/04/24 0446 01/04/24 0942 01/04/24 1711 01/05/24 0426 01/05/24 1351 01/05/24 1756 01/06/24 0336 01/07/24 0418 01/08/24 0308  NA 145   < > 143 146* 145  --  148* 149* 149*  K 2.7*   < > 3.2* 3.5 3.8  --  3.2* 3.2* 3.4*  CL 110   < > 111 114*  --   --  118* 120* 117*  CO2 19*   < > 17* 18*  --   --  20* 21* 21*  GLUCOSE 126*   < > 236* 286*  --   --  249* 213* 243*  BUN 41*   < > 47* 51*  --   --  61* 68* 71*  CREATININE 5.09*   < > 5.35* 5.62*  --   --  5.62* 5.28* 4.64*  CALCIUM 7.4*   < > 7.2* 7.6*  --   --  7.6* 7.7* 8.0*  MG 1.7  --  2.0 2.0  --  2.2 2.1  --   --   PHOS 6.9*  --   --  5.2*  --   --  5.3* 4.9* 4.9*   < > = values in this interval not displayed.   GFR: Estimated Creatinine Clearance: 25 mL/min (A) (by C-G formula based on SCr of 4.64 mg/dL (H)). Recent Labs  Lab 01/03/24 1130 01/03/24 1640 01/04/24 0446 01/05/24 0426 01/06/24 0336 01/07/24 0418 01/08/24 0308  WBC  --   --    < > 14.1* 12.7* 10.2 9.9  LATICACIDVEN 2.7* 1.9  --   --   --   --   --    < > = values in this interval not displayed.    Liver Function Tests: Recent Labs  Lab 01/03/24 1119 01/08/24 0308  AST 45* 33  ALT 22 14  ALKPHOS 87 58  BILITOT 0.7 0.4  PROT 5.7* 4.9*  ALBUMIN 1.8* 1.8*   No results for input(s): LIPASE, AMYLASE in the last 168 hours. Recent Labs  Lab 01/03/24 1203  AMMONIA 55*    ABG    Component Value Date/Time   PHART 7.472 (H) 01/05/2024 1351    PCO2ART 25.1 (L) 01/05/2024 1351   PO2ART 188 (H) 01/05/2024 1351   HCO3 18.4 (L) 01/05/2024 1351   TCO2 19 (L) 01/05/2024 1351   ACIDBASEDEF 4.0 (H) 01/05/2024 1351   O2SAT 100 01/05/2024 1351     Coagulation Profile: No results for input(s): INR, PROTIME in the last 168 hours.  Cardiac Enzymes:  Recent Labs  Lab 01/03/24 1119 01/04/24 0446 01/05/24 0426 01/06/24 0336  CKTOTAL 1,475* 1,717* 619* 371  CKMB 32.2*  --   --   --     HbA1C: Hgb A1c MFr Bld  Date/Time Value Ref Range Status  01/03/2024 11:19 AM 9.3 (H) 4.8 - 5.6 % Final    Comment:    (NOTE) Diagnosis of Diabetes The following HbA1c ranges recommended by the American Diabetes Association (ADA) may be used as an aid in the diagnosis of diabetes mellitus.  Hemoglobin             Suggested A1C NGSP%              Diagnosis  <5.7                   Non Diabetic  5.7-6.4                Pre-Diabetic  >6.4                   Diabetic  <7.0                   Glycemic control for                       adults with diabetes.      CBG: Recent Labs  Lab 01/07/24 1534 01/07/24 1929 01/07/24 2313 01/08/24 0333 01/08/24 0813  GLUCAP 234* 213* 253* 231* 231*    Review of Systems:   See HPI  Past Medical History:  He,  has no past medical history on file.   Surgical History:  History reviewed. No pertinent surgical history.   Social History:      Family History:  His family history is not on file.   Allergies No Known Allergies   The patient is critically ill with multiple organ systems failure and requires high complexity decision making for assessment and support, frequent evaluation and titration of therapies, application of advanced monitoring technologies and extensive interpretation of multiple databases. Critical Care Time devoted to patient care services described in this note independent of APP/resident time (if applicable)  is 32 minutes.   Jennet Epley MD [facility] Personal pager: See Amion If unanswered, please page CCM On-call: #[phone]

## 2024-01-09 DIAGNOSIS — E870 Hyperosmolality and hypernatremia: Secondary | ICD-10-CM

## 2024-01-09 LAB — RENAL FUNCTION PANEL
Albumin: 1.6 g/dL — ABNORMAL LOW (ref 3.5–5.0)
Anion gap: 9 (ref 5–15)
BUN: 71 mg/dL — ABNORMAL HIGH (ref 6–20)
CO2: 21 mmol/L — ABNORMAL LOW (ref 22–32)
Calcium: 7.8 mg/dL — ABNORMAL LOW (ref 8.9–10.3)
Chloride: 117 mmol/L — ABNORMAL HIGH (ref 98–111)
Creatinine, Ser: 3.97 mg/dL — ABNORMAL HIGH (ref 0.61–1.24)
GFR, Estimated: 17 mL/min — ABNORMAL LOW (ref >60)
Glucose, Bld: 205 mg/dL — ABNORMAL HIGH (ref 70–99)
Phosphorus: 5.4 mg/dL — ABNORMAL HIGH (ref 2.5–4.6)
Potassium: 3.9 mmol/L (ref 3.5–5.1)
Sodium: 147 mmol/L — ABNORMAL HIGH (ref 135–145)

## 2024-01-09 LAB — GLUCOSE, CAPILLARY
Glucose-Capillary: 168 mg/dL — ABNORMAL HIGH (ref 70–99)
Glucose-Capillary: 181 mg/dL — ABNORMAL HIGH (ref 70–99)
Glucose-Capillary: 185 mg/dL — ABNORMAL HIGH (ref 70–99)
Glucose-Capillary: 189 mg/dL — ABNORMAL HIGH (ref 70–99)
Glucose-Capillary: 213 mg/dL — ABNORMAL HIGH (ref 70–99)
Glucose-Capillary: 223 mg/dL — ABNORMAL HIGH (ref 70–99)

## 2024-01-09 LAB — CBC
HCT: 36.2 % — ABNORMAL LOW (ref 39.0–52.0)
Hemoglobin: 11.6 g/dL — ABNORMAL LOW (ref 13.0–17.0)
MCH: 30.5 pg (ref 26.0–34.0)
MCHC: 32 g/dL (ref 30.0–36.0)
MCV: 95.3 fL (ref 80.0–100.0)
Platelets: 172 K/uL (ref 150–400)
RBC: 3.8 MIL/uL — ABNORMAL LOW (ref 4.22–5.81)
RDW: 15.7 % — ABNORMAL HIGH (ref 11.5–15.5)
WBC: 11.7 K/uL — ABNORMAL HIGH (ref 4.0–10.5)
nRBC: 0 % (ref 0.0–0.2)

## 2024-01-09 LAB — MAGNESIUM: Magnesium: 2.3 mg/dL (ref 1.7–2.4)

## 2024-01-09 MED ORDER — LABETALOL HCL 200 MG PO TABS
200.0000 mg | ORAL_TABLET | Freq: Two times a day (BID) | ORAL | Status: DC
  Administered 2024-01-09 – 2024-01-15 (×24): 200 mg
  Filled 2024-01-09 (×12): qty 1

## 2024-01-09 NOTE — Evaluation (Signed)
 RT Evaluate and Treat Note  01/09/2024   Breathing is (select one): Unchanged   The following was found on auscultation (select multiple):  Bilateral Breath Sounds: Clear (01/09/24 0804)  R Upper  Breath Sounds: Clear (01/09/24 0804) L Upper Breath Sounds: Clear (01/09/24 0804) R Lower Breath Sounds: Clear (01/09/24 0804) L Lower Breath Sounds: Clear (01/09/24 0804)    Cough Assessment: Cough: Strong (01/08/24 0800)    Most Recent Chest Xray:... (No results found.    The following medications and/or interventions were ordered/changed/discontinued as part of the Respiratory Treatment protocol:   Medication Changes: None    Airway Clearance Changes: None   Oxygen Therapy Changes: None

## 2024-01-09 NOTE — IPAL (Signed)
  Interdisciplinary Goals of Care Family Meeting   Date carried out: 01/09/2024  Location of the meeting: Bedside  Member's involved: Nurse Practitioner, Bedside Registered Nurse, and Family Member or next of kin  Durable Power of Attorney or acting medical decision maker: Patient's mother, Naomie  Discussion: Daily update held with Naomie this a.m. with discussion also held regarding repeat MRI results.  Naomie was made aware that patient has anoxic injury as seen on repeat MRI.  Given this finding patient is likely not to have any meaningful neurological recovery and Naomie understands this.  At this time she would like to focus on contacting family to allow open visitation and will contact/update clinical team when she is ready to transition to comfort care  Code status:   Code Status: Do not attempt resuscitation (DNR) PRE-ARREST INTERVENTIONS DESIRED   Disposition: Continue current acute care  Time spent for the meeting: 35 mins   Nimco Bivens D. Harris, NP-C [facility] Personal contact information can be found on Amion  If no contact or response made please call [phone] 01/09/2024, 5:34 PM

## 2024-01-09 NOTE — TOC Progression Note (Signed)
 Transition of Care [facility]) - Progression Note    Patient Details  Name: Steven Christensen MRN: [id number] Date of Birth: [birth year]  Transition of Care [facility]) CM/SW Contact  Tom-Johnson, Harvest Muskrat, RN Phone Number: 01/09/2024, 2:17 PM  Clinical Narrative:     Patient continues to be intubated, tolerating SBT MRI yesterday 01/08/24 showed widespread Anoxic Brain Injury. Palliative following with [facility].   Patient not Medically ready for discharge.  CM will continue to follow as patient progresses with care towards discharge.                            Expected Discharge Plan and Services                                               Social Drivers of Health (SDOH) Interventions SDOH Screenings   Food Insecurity: Patient Unable To Answer (01/04/2024)  Housing: Patient Unable To Answer (01/04/2024)  Transportation Needs: Patient Unable To Answer (01/04/2024)  Utilities: Patient Unable To Answer (01/04/2024)    Readmission Risk Interventions     No data to display

## 2024-01-09 NOTE — Plan of Care (Signed)
  Problem: Nutritional: Goal: Maintenance of adequate nutrition will improve Outcome: Progressing   Problem: Skin Integrity: Goal: Risk for impaired skin integrity will decrease Outcome: Progressing   Problem: Tissue Perfusion: Goal: Adequacy of tissue perfusion will improve Outcome: Progressing   Problem: Clinical Measurements: Goal: Ability to maintain clinical measurements within normal limits will improve Outcome: Progressing Goal: Will remain free from infection Outcome: Progressing Goal: Respiratory complications will improve Outcome: Progressing Goal: Cardiovascular complication will be avoided Outcome: Progressing   Problem: Clinical Measurements: Goal: Diagnostic test results will improve Outcome: Not Progressing

## 2024-01-09 NOTE — Progress Notes (Signed)
 NAME:  Steven Christensen, MRN:  [id number], DOB:  1971/01/11, LOS: 6 ADMISSION DATE:  01/03/2024, CONSULTATION DATE:  01/03/24 REFERRING MD:  EDP, CHIEF COMPLAINT:  AMS   History of Present Illness:  53 year old man w/ hx of polysubstance abuse, associated schizoaffective disorder, and DM with not feeling well since 9/28 with vomiting with progressive AMS.  Found obtunded by EMS with facial swelling, obtunded, hypoxic and hypoglycemic.  Intubated for airway protection.  AKI, hypokalemic, +cocaine.  Pan scan done and PCCM consulted.   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  Non compliant with most of his meds.  Pertinent  Medical History  Schizophrenia Polysubstance abuse- tobacco, cocaine, alcohol DM Suicide attempts  Significant Hospital Events: Including procedures, antibiotic start and stop dates in addition to other pertinent events   9/30 admit, intubated  10/4-Tolerated weaning for about an hour 10/5 uncontrolled hypertension, tolerating weaning 10/6 no acute issues overnight, tolerating SBT currently but mentation continues to preclude extubation.  Brain completed yesterday afternoon and consistent with widespread anoxic brain injury  Interim History / Subjective:  Tolerating SBT this a.m., will open eyes to painful stimuli left deviated gaze  Objective    Blood pressure (!) 173/73, pulse 72, temperature (!) 97.5 F (36.4 C), resp. rate 17, height 6' 4 (1.93 m), weight 119.1 kg, SpO2 100%. CVP:  [0 mmHg-11 mmHg] 9 mmHg  Vent Mode: PSV;CPAP FiO2 (%):  [40 %-50 %] 40 % Set Rate:  [16 bmp-18 bmp] 16 bmp Vt Set:  [690 mL] 690 mL PEEP:  [5 cmH20] 5 cmH20 Pressure Support:  [8 cmH20] 8 cmH20 Plateau Pressure:  [11 cmH20-16 cmH20] 16 cmH20   Intake/Output Summary (Last 24 hours) at 01/09/2024 1021 Last data filed at 01/09/2024 0848 Gross per 24 hour  Intake 3230.78 ml  Output 1680 ml  Net  1550.78 ml   Filed Weights   01/07/24 0500 01/08/24 0316 01/09/24 0500  Weight: 115.3 kg 115.1 kg 119.1 kg    Examination: General: On chronic ill-appearing middle-aged male lying in bed on mechanical ventilation in no acute distress HEENT: ETT, MM pink/moist, PERRL,  Neuro: Opens eyes to painful stimuli, deep forced left gaze, unable to follow commands CV: s1s2 regular rate and rhythm, no murmur, rubs, or gallops,  PULM: Clear to auscultation bilaterally, no increased work of breathing, normal breath sounds GI: soft, bowel sounds active in all 4 quadrants, non-tender, non-distended, tolerating TF Extremities: warm/dry, no edema  Skin: no rashes or lesions  Resolved problem list  Sepsis  Assessment and Plan   Anoxic encephalopathy Metabolic encephalopathy History of polysubstance abuse - Patient initially presented unresponsive with agonal respirations per EMS.  He required bag mask assisted ventilations immediately on arrival.  EMS attempted 2 unsuccessful intubations in the field but was able to support patient clinically with bag mask assist until ED arrival.  Per EMS run report it does appear patient had signs of facial and tongue swelling on their arrival concerning for potential angioedema resulting in hypoxemia -MRI brain 10/5 consistent with widespread anoxic brain injury P: Continue neuroprotective measures Seizure precautions Remain off of sedating medications for neuroexam  Ongoing goals of care discussion, see below  Acute hypoxemic respiratory failure Aspiration pneumonia P: Mentation continues to preclude extubation  Continue ventilator support with lung protective strategies  Wean PEEP and FiO2 for sats greater than 90%. Head of bed elevated 30 degrees. Plateau pressures less than 30  cm H20.  Follow intermittent chest x-ray and ABG.   SAT/SBT as tolerated, mentation preclude extubation  Ensure adequate pulmonary hygiene  Follow cultures  VAP bundle in place   PAD protocol Continue ceftriaxone  Right upper extremity blistering P: Local wound care   Acute kidney injury -Creatinine on admission 5.11 with GFR 13 on known renal baseline P: Renal function slightly improved with GFR up to 17 Follow renal function  Monitor urine output Trend Bmet Avoid nephrotoxins Ensure adequate renal perfusion  Daily assessment for need to diurese  Hypoglycemia P: Continue SSI CBG goal 140-180 CBG checks every 4 Continue tube feeds  History of paranoid schizophrenia P: Supportive care  Hypernatremia P: Continue free water flushes Daily assessment for need to diurese  Hypokalemia P: Supplement as needed  Hypertension P: Continue amlodipine As needed IV antihypertensives  Goals of care Palliative care following with last reported conversation with family 10/4.  At this time decision was made to continue current supportive care with low threshold to transition to comfort based on patient's clinical progression.  Given widespread anoxic injury will engage with family again today for ongoing discussion of goals of care  Critical care   CRITICAL CARE Performed by: Claudie Rathbone D. Harris   Total critical care time: 38 minutes  Critical care time was exclusive of separately billable procedures and treating other patients.  Critical care was necessary to treat or prevent imminent or life-threatening deterioration.  Critical care was time spent personally by me on the following activities: development of treatment plan with patient and/or surrogate as well as nursing, discussions with consultants, evaluation of patient's response to treatment, examination of patient, obtaining history from patient or surrogate, ordering and performing treatments and interventions, ordering and review of laboratory studies, ordering and review of radiographic studies, pulse oximetry and re-evaluation of patient's condition.  Zehava Turski D. Harris, NP-C [facility] Personal contact information can be found on Amion  If no contact or response made please call [phone] 01/09/2024, 10:24 AM

## 2024-01-10 ENCOUNTER — Encounter (HOSPITAL_COMMUNITY): Payer: Self-pay | Admitting: Internal Medicine

## 2024-01-10 DIAGNOSIS — Z789 Other specified health status: Secondary | ICD-10-CM

## 2024-01-10 LAB — BASIC METABOLIC PANEL WITH GFR
Anion gap: 11 (ref 5–15)
BUN: 75 mg/dL — ABNORMAL HIGH (ref 6–20)
CO2: 21 mmol/L — ABNORMAL LOW (ref 22–32)
Calcium: 8 mg/dL — ABNORMAL LOW (ref 8.9–10.3)
Chloride: 115 mmol/L — ABNORMAL HIGH (ref 98–111)
Creatinine, Ser: 3.58 mg/dL — ABNORMAL HIGH (ref 0.61–1.24)
GFR, Estimated: 19 mL/min — ABNORMAL LOW (ref >60)
Glucose, Bld: 192 mg/dL — ABNORMAL HIGH (ref 70–99)
Potassium: 4.5 mmol/L (ref 3.5–5.1)
Sodium: 147 mmol/L — ABNORMAL HIGH (ref 135–145)

## 2024-01-10 LAB — GLUCOSE, CAPILLARY
Glucose-Capillary: 199 mg/dL — ABNORMAL HIGH (ref 70–99)
Glucose-Capillary: 185 mg/dL — ABNORMAL HIGH (ref 70–99)
Glucose-Capillary: 190 mg/dL — ABNORMAL HIGH (ref 70–99)
Glucose-Capillary: 151 mg/dL — ABNORMAL HIGH (ref 70–99)
Glucose-Capillary: 147 mg/dL — ABNORMAL HIGH (ref 70–99)

## 2024-01-10 LAB — TRIGLYCERIDES: Triglycerides: 57 mg/dL (ref <150)

## 2024-01-10 MED ORDER — HYDRALAZINE HCL 20 MG/ML IJ SOLN
10.0000 mg | INTRAMUSCULAR | Status: DC | PRN
  Administered 2024-01-10 – 2024-01-14 (×20): 20 mg via INTRAVENOUS
  Filled 2024-01-10 (×9): qty 1

## 2024-01-10 NOTE — Evaluation (Signed)
 RT Evaluate and Treat Note  01/10/2024   Breathing is (select one): unchanged    The following was found on auscultation (select multiple):  Bilateral Breath Sounds: Clear;Diminished (01/10/24 0753)  R Upper  Breath Sounds: Clear (01/09/24 2000) L Upper Breath Sounds: Clear (01/09/24 2000) R Lower Breath Sounds: Diminished (01/09/24 2000) L Lower Breath Sounds: Diminished (01/09/24 2000)    Cough Assessment: Cough: Strong (01/10/24 0753)    Most Recent Chest Xray:... (No results found.    The following medications and/or interventions were ordered/changed/discontinued as part of the Respiratory Treatment protocol:   Medication Changes: none   Airway Clearance Changes: none   Oxygen Therapy Changes: none

## 2024-01-10 NOTE — Progress Notes (Signed)
                                                                                                                                                                                                          Palliative Medicine Progress Note   Patient Name: Steven Christensen       Date: 01/10/2024 DOB: November 21, 1970  Age: 53 y.o. MRN#: [id number] Attending Physician: Meade Verdon RAMAN, MD Primary Care Physician: Shelda Atlas, MD Admit Date: 01/03/2024  Reason for Consultation/Follow-up: {Reason for Consult:23484}  HPI/Patient Profile: 53 y.o. male  with past medical history of polysubstance abuse, associated schizoaffective disorder, and diabetes who presented on 01/03/2024 with progressive altered mental status.  On EMS arrival, he was found obtunded and hypoglycemic.  Intubated in the ED for airway protection.  Initial workup showed AKI, hypokalemia, and positive cocaine.  He is admitted with anoxic encephalopathy, acute respiratory failure, and acute renal failure.   Palliative Medicine has been consulted for goals of care discussions and complex medical decision making.   Subjective: ***  Objective:  Physical Exam          Vital Signs: BP (!) 160/66   Pulse 68   Temp 98.6 F (37 C)   Resp 13   Ht 6' 4 (1.93 m)   Wt 118 kg   SpO2 100%   BMI 31.67 kg/m  SpO2: SpO2: 100 % O2 Device: O2 Device: Ventilator O2 Flow Rate:    Intake/output summary:  Intake/Output Summary (Last 24 hours) at 01/10/2024 1445 Last data filed at 01/10/2024 1400 Gross per 24 hour  Intake 3160 ml  Output 1205 ml  Net 1955 ml    LBM: Last BM Date : 01/09/24     Palliative Assessment/Data: ***     Palliative Medicine Assessment & Plan   Assessment: Principal Problem:   Acute metabolic encephalopathy    Recommendations/Plan: ***  Goals of Care and Additional Recommendations: Limitations on Scope of Treatment: {Recommended Scope and Preferences:21019}  Code Status:   Prognosis:  {Palliative Care  Prognosis:23504}  Discharge Planning: {Palliative dispostion:23505}  Care plan was discussed with ***  Thank you for allowing the Palliative Medicine Team to assist in the care of this patient.   ***   Recardo KATHEE Loll, NP   Please contact Palliative Medicine Team phone at [phone] for questions and concerns.  For individual providers, please see AMION.

## 2024-01-10 NOTE — Progress Notes (Signed)
 NAME:  Steven Christensen, MRN:  [id number], DOB:  03/08/1971, LOS: 7 ADMISSION DATE:  01/03/2024, CONSULTATION DATE:  01/03/24 REFERRING MD:  EDP, CHIEF COMPLAINT:  AMS   History of Present Illness:  53 year old man w/ hx of polysubstance abuse, associated schizoaffective disorder, and DM with not feeling well since 9/28 with vomiting with progressive AMS.  Found obtunded by EMS with facial swelling, obtunded, hypoxic and hypoglycemic.  Intubated for airway protection.  AKI, hypokalemic, +cocaine.  Pan scan done and PCCM consulted.   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  Non compliant with most of his meds.  Pertinent  Medical History  Schizophrenia Polysubstance abuse- tobacco, cocaine, alcohol DM Suicide attempts  Significant Hospital Events: Including procedures, antibiotic start and stop dates in addition to other pertinent events   9/30 admit, intubated  10/4-Tolerated weaning for about an hour 10/5 uncontrolled hypertension, tolerating weaning 10/6 no acute issues overnight, tolerating SBT currently but mentation continues to preclude extubation.  Brain completed yesterday afternoon and consistent with widespread anoxic brain injury 10/7 GOC meeting yesterday- focus on comfort, allow for open visitation   Interim History / Subjective:  Intubated Not following commands  Objective    Blood pressure (!) 161/69, pulse 62, temperature 98.2 F (36.8 C), resp. rate 16, height 6' 4 (1.93 m), weight 118 kg, SpO2 100%.    Vent Mode: PRVC FiO2 (%):  [40 %] 40 % Set Rate:  [16 bmp] 16 bmp Vt Set:  [690 mL] 690 mL PEEP:  [5 cmH20] 5 cmH20 Pressure Support:  [8 cmH20] 8 cmH20 Plateau Pressure:  [14 cmH20-18 cmH20] 17 cmH20   Intake/Output Summary (Last 24 hours) at 01/10/2024 1000 Last data filed at 01/10/2024 0855 Gross per 24 hour  Intake 2970.18 ml  Output 1425 ml  Net 1545.18 ml   Filed  Weights   01/08/24 0316 01/09/24 0500 01/10/24 0500  Weight: 115.1 kg 119.1 kg 118 kg    Examination: General: acute on chronically ill adult male, lying in icu bed on vent in NAD  HEENT: Normocephalic, PERRLA intact, ETT, OG, Pink MM CV: s1,s2, RRR, no MRG, No JVD  pulm: clear, diminished, no distress on vent  Abs: bs active, soft  Extremities: generalized edema, no deformity, no purposeful movement Skin: no rash  Neuro: Rass -1 to 0, negative cough/gag  GU: foley intact   Resolved problem list  Sepsis Hypokalemia   Assessment and Plan  Goals of care Palliative care following with last reported conversation with family 10/4.  At this time decision was made to continue current supportive care with low threshold to transition to comfort based on patient's clinical progression.  Given widespread anoxic injury will engage with family again today for ongoing discussion of goals of care See IPAL noted on 10/6- discussion with mother that significant anoxic brain injury noted on MRI, plan allow for open visitation, once family is ready transition to comfort care- one way extubation  P:  Once family is ready, transition to comfort care   Anoxic encephalopathy Metabolic encephalopathy History of polysubstance abuse - Patient initially presented unresponsive with agonal respirations per EMS.  He required bag mask assisted ventilations immediately on arrival.  EMS attempted 2 unsuccessful intubations in the field but was able to support patient clinically with bag mask assist until ED arrival.  Per EMS run report it does appear patient had signs of facial and tongue swelling on  their arrival concerning for potential angioedema resulting in hypoxemia -MRI brain 10/5 consistent with widespread anoxic brain injury P: Continue neuroprotective/seizure precautions  Continue to remain off sedation   Acute hypoxemic respiratory failure Aspiration pneumonia P:  Continue ventilator support and  lung protective strategies  Continue LTVV  Wean PEEP and Fio2 requirements to sat goal of >92%  HOB > 30 degrees Plat < 30  Aim for Driving pressures < 15  Intermittent Chest X-ray and ABGS Obtain and follow cultures-blood and tracheal aspirate Continue to remain off sedation, plan to one way extubate and transition to comfort, once family is ready   Right upper extremity blistering P: Continue local wound care    Acute kidney injury Hypernatremia  -Creatinine on admission 5.11 with GFR 13 on known renal baseline P: Continue to trend renal function daily  Continue to monitor and optimize electrolytes daily Continue to monitor urine output Continue strict I/Os Continue Adequate renal perfusion  Avoid nephrotoxic agents   Hypoglycemia Now hyperglycemia  P: Continue SSI for now  Continue CBG checks q 4 hours Continue tube feeds  History of paranoid schizophrenia P: Continue supportive care   Hypertension P: Continue amlodipine for now  Continue prn hydral    Critical care: 45 mins   Christian Montay Vanvoorhis AGACNP-BC   [facility] 01/10/2024, 10:13 AM  Please see [email or web address] for pager details.  From 7A-7P if no response, please call [phone]. After hours, please call ELink [phone].

## 2024-01-10 NOTE — Plan of Care (Signed)
  Problem: Clinical Measurements: Goal: Ability to maintain clinical measurements within normal limits will improve Outcome: Progressing Goal: Will remain free from infection Outcome: Progressing Goal: Diagnostic test results will improve Outcome: Progressing Goal: Respiratory complications will improve Outcome: Progressing Goal: Cardiovascular complication will be avoided Outcome: Progressing   Problem: Nutrition: Goal: Adequate nutrition will be maintained Outcome: Progressing   Problem: Activity: Goal: Risk for activity intolerance will decrease Outcome: Not Progressing

## 2024-01-10 NOTE — Plan of Care (Signed)
  Problem: Metabolic: Goal: Ability to maintain appropriate glucose levels will improve Outcome: Progressing   Problem: Nutritional: Goal: Maintenance of adequate nutrition will improve Outcome: Progressing   Problem: Skin Integrity: Goal: Risk for impaired skin integrity will decrease Outcome: Progressing   Problem: Tissue Perfusion: Goal: Adequacy of tissue perfusion will improve Outcome: Progressing   Problem: Clinical Measurements: Goal: Ability to maintain clinical measurements within normal limits will improve Outcome: Progressing Goal: Will remain free from infection Outcome: Progressing Goal: Diagnostic test results will improve Outcome: Progressing Goal: Respiratory complications will improve Outcome: Progressing Goal: Cardiovascular complication will be avoided Outcome: Progressing   Problem: Activity: Goal: Risk for activity intolerance will decrease Outcome: Progressing   Problem: Nutrition: Goal: Adequate nutrition will be maintained Outcome: Progressing   Problem: Coping: Goal: Level of anxiety will decrease Outcome: Progressing   Problem: Elimination: Goal: Will not experience complications related to bowel motility Outcome: Progressing Goal: Will not experience complications related to urinary retention Outcome: Progressing   Problem: Pain Managment: Goal: General experience of comfort will improve and/or be controlled Outcome: Progressing   Problem: Safety: Goal: Ability to remain free from injury will improve Outcome: Progressing   Problem: Skin Integrity: Goal: Risk for impaired skin integrity will decrease Outcome: Progressing

## 2024-01-11 ENCOUNTER — Encounter (HOSPITAL_COMMUNITY): Payer: Self-pay | Admitting: Internal Medicine

## 2024-01-11 DIAGNOSIS — Z789 Other specified health status: Secondary | ICD-10-CM

## 2024-01-11 DIAGNOSIS — Z515 Encounter for palliative care: Secondary | ICD-10-CM

## 2024-01-11 DIAGNOSIS — J9691 Respiratory failure, unspecified with hypoxia: Secondary | ICD-10-CM

## 2024-01-11 LAB — BASIC METABOLIC PANEL WITH GFR
Anion gap: 13 (ref 5–15)
BUN: 82 mg/dL — ABNORMAL HIGH (ref 6–20)
CO2: 22 mmol/L (ref 22–32)
Calcium: 8 mg/dL — ABNORMAL LOW (ref 8.9–10.3)
Chloride: 111 mmol/L (ref 98–111)
Creatinine, Ser: 3.22 mg/dL — ABNORMAL HIGH (ref 0.61–1.24)
GFR, Estimated: 22 mL/min — ABNORMAL LOW (ref >60)
Glucose, Bld: 134 mg/dL — ABNORMAL HIGH (ref 70–99)
Potassium: 4.5 mmol/L (ref 3.5–5.1)
Sodium: 146 mmol/L — ABNORMAL HIGH (ref 135–145)

## 2024-01-11 LAB — GLUCOSE, CAPILLARY
Glucose-Capillary: 109 mg/dL — ABNORMAL HIGH (ref 70–99)
Glucose-Capillary: 116 mg/dL — ABNORMAL HIGH (ref 70–99)
Glucose-Capillary: 127 mg/dL — ABNORMAL HIGH (ref 70–99)
Glucose-Capillary: 130 mg/dL — ABNORMAL HIGH (ref 70–99)
Glucose-Capillary: 136 mg/dL — ABNORMAL HIGH (ref 70–99)
Glucose-Capillary: 158 mg/dL — ABNORMAL HIGH (ref 70–99)
Glucose-Capillary: 90 mg/dL (ref 70–99)

## 2024-01-11 NOTE — Progress Notes (Signed)
 Pt placed back on previous full support vent setting to rest for the night

## 2024-01-11 NOTE — Progress Notes (Signed)
 Nutrition Follow-up  DOCUMENTATION CODES:   Not applicable  INTERVENTION:  TF via OGT: Osmolite 1.5  goal rate of 76ml/hr ( daily) 60ml ProSource TF20 BID Provides 2500 kcal, 138g protein, free water daily  Free water flushes q4h-per CCM ( per day)   MVI with minerals daily and Juven BID to support wound healing.   NUTRITION DIAGNOSIS:  Inadequate oral intake related to acute illness as evidenced by NPO status. - NPO status ongoing  GOAL:  Patient will meet greater than or equal to 90% of their needs - goal met  MONITOR:   Vent status, Labs, Weight trends, TF tolerance, Skin  REASON FOR ASSESSMENT:   Ventilator, Consult Enteral/tube feeding initiation and management (trickle tube feeds only)  ASSESSMENT:   Pt admitted with AMS, emesis and hypoglycemia. PMH significant for polysubstance use, associated schizoaffective disorder and diabetes.  9/30- admitted w/AMS; intubated; MRI concern for evolving ABI  10/5 - MRI brain consistent with widespread ABI 10/6 - GOC- focus on comfort  Pt continues to remain intubated on vent support. Mentation precludes extubation.  Discussed in interdisciplinary rounds.   Continues with current interventions. Likely to transition to comfort in the next 1-2 days.   Continues to tolerate tube feeding via OGT at goal rate. 1  Admit weight: 105.5 kg Current weight: 119 kg + significant edema  Medications: colace BID, lasix 40mg  q12h, SSI 0-9 units q4h, novolog 3 units q4h, lantus 15 units BID, MVI, miralax daily, thiamine  Labs:   Diet Order:   Diet Order             Diet NPO time specified  Diet effective now                   EDUCATION NEEDS:   No education needs have been identified at this time  Skin:  Skin Assessment: Skin Integrity Issues: Skin Integrity Issues:: Stage I, Other (Comment), DTI DTI: right ear, right knee, right upper head Stage I: right buttock Other: open blisters to  right arm  Last BM:  10/8 type 7 medium  Height:   Ht Readings from Last 1 Encounters:  01/11/24 6' 4 (1.93 m)    Weight:   Wt Readings from Last 1 Encounters:  01/11/24 119 kg    Ideal Body Weight:  91.8 kg  BMI:  Body mass index is 31.93 kg/m.  Estimated Nutritional Needs:   Kcal:  2300-2500  Protein:  120-135g  Fluid:  >/=2L  Royce Maris, RDN, LDN Clinical Nutrition See AMiON for contact information.

## 2024-01-11 NOTE — Progress Notes (Signed)
 NAME:  Steven Christensen, MRN:  [id number], DOB:  [birth year], LOS: 8 ADMISSION DATE:  01/03/2024, CONSULTATION DATE:  01/03/24 REFERRING MD:  EDP, CHIEF COMPLAINT:  AMS   History of Present Illness:  53 year old man w/ hx of polysubstance abuse, associated schizoaffective disorder, and DM with not feeling well since 9/28 with vomiting with progressive AMS.  Found obtunded by EMS with facial swelling, obtunded, hypoxic and hypoglycemic.  Intubated for airway protection.  AKI, hypokalemic, +cocaine.  Pan scan done and PCCM consulted.   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  Non compliant with most of his meds.  Pertinent  Medical History  Schizophrenia Polysubstance abuse- tobacco, cocaine, alcohol DM Suicide attempts  Significant Hospital Events: Including procedures, antibiotic start and stop dates in addition to other pertinent events   9/30 admit, intubated  10/4-Tolerated weaning for about an hour 10/5 uncontrolled hypertension, tolerating weaning 10/6 no acute issues overnight, tolerating SBT currently but mentation continues to preclude extubation.  Brain completed yesterday afternoon and consistent with widespread anoxic brain injury 10/7 GOC meeting yesterday- focus on comfort, allow for open visitation   Interim History / Subjective:  No overnight issues. Not responsive. No family at bedside.  On PS trial.  Objective    Blood pressure (!) 172/70, pulse 82, temperature 97.7 F (36.5 C), temperature source Bladder, resp. rate 15, height 6' 4 (1.93 m), weight 119 kg, SpO2 100%.    Vent Mode: PSV;CPAP FiO2 (%):  [40 %] 40 % Set Rate:  [16 bmp] 16 bmp Vt Set:  [690 mL] 690 mL PEEP:  [5 cmH20-8 cmH20] 5 cmH20 Pressure Support:  [8 cmH20] 8 cmH20 Plateau Pressure:  [15 cmH20-18 cmH20] 18 cmH20   Intake/Output Summary (Last 24 hours) at 01/11/2024 0937 Last data filed at 01/11/2024  0916 Gross per 24 hour  Intake 2235 ml  Output 1330 ml  Net 905 ml   Filed Weights   01/09/24 0500 01/10/24 0500 01/11/24 0443  Weight: 119.1 kg 118 kg 119 kg    Examination: Gen:      Intubated, not responsive, acutely ill appearing HEENT:  ETT to vent Lungs:    sounds of mechanical ventilation auscultated no wheeze CV:          Abd:      + bowel sounds; soft, non-tender; no palpable masses, no distension Ext:    No edema Skin:      Warm and dry; no rashes Neuro:   not sedated, not responsive.    Resolved problem list  Sepsis Hypokalemia   Assessment and Plan  Goals of care Palliative care following with last reported conversation with family 10/4.  At this time decision was made to continue current supportive care with low threshold to transition to comfort based on patient's clinical progression.  Given widespread anoxic injury will engage with family again today for ongoing discussion of goals of care See IPAL noted on 10/6- discussion with mother that significant anoxic brain injury noted on MRI, plan allow for open visitation, once family is ready transition to comfort care- one way extubation  P:  Once family is ready, transition to comfort care   Anoxic encephalopathy Metabolic encephalopathy History of polysubstance abuse - Patient initially presented unresponsive with agonal respirations per EMS.  He required bag mask assisted ventilations immediately on arrival.  EMS attempted 2 unsuccessful intubations in the field but was able to support patient clinically  with bag mask assist until ED arrival.  Per EMS run report it does appear patient had signs of facial and tongue swelling on their arrival concerning for potential angioedema resulting in hypoxemia -MRI brain 10/5 consistent with widespread anoxic brain injury P: Continue neuroprotective/seizure precautions  Continue to remain off sedation   Acute hypoxemic respiratory failure Aspiration pneumonia P:   Continue ventilator support and lung protective strategies  Continue LTVV  Wean PEEP and Fio2 requirements to sat goal of >92%  HOB > 30 degrees Plat < 30  Aim for Driving pressures < 15  Intermittent Chest X-ray and ABGS Obtain and follow cultures-blood and tracheal aspirate Continue to remain off sedation, plan to one way extubate and transition to comfort, once family is ready   Right upper extremity blistering P: Continue local wound care    Acute kidney injury Hypernatremia  -Creatinine on admission 5.11 with GFR 13 on known renal baseline P: Continue to trend renal function daily  Continue to monitor and optimize electrolytes daily Continue to monitor urine output Continue strict I/Os Continue Adequate renal perfusion  Avoid nephrotoxic agents   Hypoglycemia Now hyperglycemia  P: Continue SSI for now  Continue CBG checks q 4 hours Continue tube feeds  History of paranoid schizophrenia P: Continue supportive care   Hypertension P: Continue amlodipine for now  Continue prn hydral    Critical care:    The patient is critically ill due to encephalopathy, cardiac arrest, respiratory failure.  Critical care was necessary to treat or prevent imminent or life-threatening deterioration.  Critical care was time spent personally by me on the following activities: development of treatment plan with patient and/or surrogate as well as nursing, discussions with consultants, evaluation of patient's response to treatment, examination of patient, obtaining history from patient or surrogate, ordering and performing treatments and interventions, ordering and review of laboratory studies, ordering and review of radiographic studies, pulse oximetry, re-evaluation of patient's condition and participation in multidisciplinary rounds.   Critical Care Time devoted to patient care services described in this note is 32 minutes. This time reflects time of care of this signee Divinity Kyler S Kadon Andrus  . This critical care time does not reflect separately billable procedures or procedure time, teaching time or supervisory time of PA/NP/Med student/Med Resident etc but could involve care discussion time.       Verdon GORMAN Meade [facility] 01/11/2024 9:37 AM  Pager: see AMION  If no response to pager , please call critical care on call (see AMION) until 7pm After 7:00 pm call Elink

## 2024-01-11 NOTE — Evaluation (Signed)
 RT Evaluate and Treat Note  01/11/2024   Breathing is (select one): Same as normal    The following was found on auscultation (select multiple):  Bilateral Breath Sounds: Diminished (01/11/24 0758)  R Upper  Breath Sounds: Diminished (01/11/24 0258) L Upper Breath Sounds: Diminished (01/11/24 0258) R Lower Breath Sounds: Diminished (01/11/24 0258) L Lower Breath Sounds: Diminished (01/11/24 0258)    Cough Assessment: Cough: Strong (01/11/24 0000)    Most Recent Chest Xray:... (No results found.    The following medications and/or interventions were ordered/changed/discontinued as part of the Respiratory Treatment protocol:   Medication Changes: None   Airway Clearance Changes: None   Oxygen Therapy Changes: None

## 2024-01-11 NOTE — Plan of Care (Signed)
  Problem: Nutritional: Goal: Maintenance of adequate nutrition will improve Outcome: Progressing   Problem: Clinical Measurements: Goal: Ability to maintain clinical measurements within normal limits will improve Outcome: Progressing Goal: Will remain free from infection Outcome: Progressing Goal: Diagnostic test results will improve Outcome: Progressing Goal: Respiratory complications will improve Outcome: Progressing Goal: Cardiovascular complication will be avoided Outcome: Progressing   Problem: Activity: Goal: Risk for activity intolerance will decrease Outcome: Not Progressing

## 2024-01-12 LAB — CBC WITH DIFFERENTIAL/PLATELET
Abs Immature Granulocytes: 0.03 K/uL (ref 0.00–0.07)
Basophils Absolute: 0 K/uL (ref 0.0–0.1)
Basophils Relative: 0 %
Eosinophils Absolute: 0.3 K/uL (ref 0.0–0.5)
Eosinophils Relative: 3 %
HCT: 32.5 % — ABNORMAL LOW (ref 39.0–52.0)
Hemoglobin: 10.3 g/dL — ABNORMAL LOW (ref 13.0–17.0)
Immature Granulocytes: 0 %
Lymphocytes Relative: 19 %
Lymphs Abs: 1.9 K/uL (ref 0.7–4.0)
MCH: 30.4 pg (ref 26.0–34.0)
MCHC: 31.7 g/dL (ref 30.0–36.0)
MCV: 95.9 fL (ref 80.0–100.0)
Monocytes Absolute: 0.9 K/uL (ref 0.1–1.0)
Monocytes Relative: 9 %
Neutro Abs: 6.9 K/uL (ref 1.7–7.7)
Neutrophils Relative %: 69 %
Platelets: 166 K/uL (ref 150–400)
RBC: 3.39 MIL/uL — ABNORMAL LOW (ref 4.22–5.81)
RDW: 15.1 % (ref 11.5–15.5)
WBC: 10.1 K/uL (ref 4.0–10.5)
nRBC: 0 % (ref 0.0–0.2)

## 2024-01-12 LAB — GLUCOSE, CAPILLARY
Glucose-Capillary: 87 mg/dL (ref 70–99)
Glucose-Capillary: 152 mg/dL — ABNORMAL HIGH (ref 70–99)
Glucose-Capillary: 171 mg/dL — ABNORMAL HIGH (ref 70–99)
Glucose-Capillary: 182 mg/dL — ABNORMAL HIGH (ref 70–99)

## 2024-01-12 LAB — BASIC METABOLIC PANEL WITH GFR
Anion gap: 11 (ref 5–15)
BUN: 89 mg/dL — ABNORMAL HIGH (ref 6–20)
CO2: 22 mmol/L (ref 22–32)
Calcium: 8.1 mg/dL — ABNORMAL LOW (ref 8.9–10.3)
Chloride: 114 mmol/L — ABNORMAL HIGH (ref 98–111)
Creatinine, Ser: 2.83 mg/dL — ABNORMAL HIGH (ref 0.61–1.24)
GFR, Estimated: 26 mL/min — ABNORMAL LOW (ref >60)
Glucose, Bld: 110 mg/dL — ABNORMAL HIGH (ref 70–99)
Potassium: 4.6 mmol/L (ref 3.5–5.1)
Sodium: 147 mmol/L — ABNORMAL HIGH (ref 135–145)

## 2024-01-12 MED ORDER — FENTANYL CITRATE (PF) 50 MCG/ML IJ SOSY
50.0000 ug | PREFILLED_SYRINGE | INTRAMUSCULAR | Status: DC | PRN
Start: 2024-01-12 — End: 2024-01-16
  Filled 2024-01-12: qty 1

## 2024-01-12 MED ORDER — FENTANYL CITRATE (PF) 50 MCG/ML IJ SOSY
50.0000 ug | PREFILLED_SYRINGE | INTRAMUSCULAR | Status: DC | PRN
Start: 2024-01-12 — End: 2024-01-16
  Administered 2024-01-15 (×2): 100 ug via INTRAVENOUS
  Filled 2024-01-12: qty 2

## 2024-01-12 MED ORDER — FREE WATER
300.0000 mL | Status: DC
  Administered 2024-01-12 – 2024-01-15 (×38): 300 mL

## 2024-01-12 NOTE — Plan of Care (Signed)
 Palliative:  Mr. Bona is resting on ventilator. No distress. No family at bedside. Discussed with RN. Family making plans to come to bedside from out of town with anticipation of transition to comfort after they are able to visit. Timing to be determined.   No charge  Bernarda Kitty, NP Palliative Medicine Team Pager [phone] (Please see [email or web address] for schedule) Team Phone [phone]

## 2024-01-12 NOTE — Progress Notes (Signed)
 NAME:  Steven Christensen, MRN:  [id number], DOB:  1971-01-03, LOS: 9 ADMISSION DATE:  01/03/2024, CONSULTATION DATE:  01/03/24 REFERRING MD:  EDP, CHIEF COMPLAINT:  AMS   History of Present Illness:  53 year old man w/ hx of polysubstance abuse, associated schizoaffective disorder, and DM with not feeling well since 9/28 with vomiting with progressive AMS.  Found obtunded by EMS with facial swelling, obtunded, hypoxic and hypoglycemic.  Intubated for airway protection.  AKI, hypokalemic, +cocaine.  Pan scan done and PCCM consulted.   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  Non compliant with most of his meds.  Pertinent  Medical History  Schizophrenia Polysubstance abuse- tobacco, cocaine, alcohol DM Suicide attempts  Significant Hospital Events: Including procedures, antibiotic start and stop dates in addition to other pertinent events   9/30 admit, intubated  10/4-Tolerated weaning for about an hour 10/5 uncontrolled hypertension, tolerating weaning 10/6 no acute issues overnight, tolerating SBT currently but mentation continues to preclude extubation.  Brain completed yesterday afternoon and consistent with widespread anoxic brain injury 10/7 GOC meeting yesterday- focus on comfort, allow for open visitation   Interim History / Subjective:  No overnight issues.   Objective    Blood pressure (!) 146/74, pulse 72, temperature 98.2 F (36.8 C), resp. rate 14, height 6' 4 (1.93 m), weight 118.5 kg, SpO2 100%.    Vent Mode: PSV;CPAP FiO2 (%):  [40 %] 40 % Set Rate:  [16 bmp] 16 bmp Vt Set:  [690 mL] 690 mL PEEP:  [5 cmH20] 5 cmH20 Pressure Support:  [8 cmH20] 8 cmH20 Plateau Pressure:  [18 cmH20] 18 cmH20   Intake/Output Summary (Last 24 hours) at 01/12/2024 1010 Last data filed at 01/12/2024 1000 Gross per 24 hour  Intake 2560 ml  Output 1285 ml  Net 1275 ml   Filed Weights   01/10/24  0500 01/11/24 0443 01/12/24 0500  Weight: 118 kg 119 kg 118.5 kg    Examination: Gen:      Intubated, acutely ill appearing, no distress HEENT:  ETT to vent Lungs:    sounds of mechanical ventilation auscultated no wheeze CV:         RRR Abd:      + bowel sounds; soft, non-tender; no palpable masses, no distension Ext:    No edema Skin:      Warm and dry; no rashes Neuro:   not sedated, not responsive    Resolved problem list  Sepsis Hypokalemia   Assessment and Plan    Anoxic encephalopathy Metabolic encephalopathy History of polysubstance abuse - Patient initially presented unresponsive with agonal respirations per EMS.  He required bag mask assisted ventilations immediately on arrival.  EMS attempted 2 unsuccessful intubations in the field but was able to support patient clinically with bag mask assist until ED arrival.  Per EMS run report it does appear patient had signs of facial and tongue swelling on their arrival concerning for potential angioedema resulting in hypoxemia -MRI brain 10/5 consistent with widespread anoxic brain injury P: Continue neuroprotective/seizure precautions  Continue to remain off sedation   Acute hypoxemic respiratory failure Aspiration pneumonia P:  Continue ventilator support and lung protective strategies  Continue LTVV  Wean PEEP and Fio2 requirements to sat goal of >92%  HOB > 30 degrees Plat < 30  Aim for Driving pressures < 15  Intermittent Chest X-ray and ABGS Obtain and follow cultures-blood and tracheal aspirate Continue  to remain off sedation, plan to one way extubate and transition to comfort, once family is ready   Right upper extremity blistering P: Continue local wound care    Acute kidney injury Hypernatremia  -Creatinine on admission 5.11 with GFR 13 on known renal baseline P: Continue to trend renal function daily  Continue to monitor and optimize electrolytes daily Continue to monitor urine output Continue  strict I/Os Continue Adequate renal perfusion  Avoid nephrotoxic agents  Continue free H20 flushes, will increase  DM2 with Hypoglycemia A1C 9.3% P: Continue SSI for now  Continue CBG checks q 4 hours Continue tube feeds  History of paranoid schizophrenia P: Continue supportive care   Hypertension P: Continue amlodipine for now  Continue prn hydral  Goals of care See IPAL noted on 10/6- discussion with mother that significant anoxic brain injury noted on MRI, plan allow for open visitation, once family is ready transition to comfort care- one way extubation  P:  Once family is ready, transition to comfort care. Will touch base with family today. Awaiting out of state family members.   The patient is critically ill due to cardiac arrest, anoxic encephalopathy.  Critical care was necessary to treat or prevent imminent or life-threatening deterioration.  Critical care was time spent personally by me on the following activities: development of treatment plan with patient and/or surrogate as well as nursing, discussions with consultants, evaluation of patient's response to treatment, examination of patient, obtaining history from patient or surrogate, ordering and performing treatments and interventions, ordering and review of laboratory studies, ordering and review of radiographic studies, pulse oximetry, re-evaluation of patient's condition and participation in multidisciplinary rounds.   Critical Care Time devoted to patient care services described in this note is 31 minutes. This time reflects time of care of this signee Destynee Stringfellow S Alia Parsley . This critical care time does not reflect separately billable procedures or procedure time, teaching time or supervisory time of PA/NP/Med student/Med Resident etc but could involve care discussion time.       Verdon GORMAN [facility] 01/12/2024 10:10 AM  Pager: see AMION  If no response to pager , please call  critical care on call (see AMION) until 7pm After 7:00 pm call Elink

## 2024-01-12 NOTE — Evaluation (Signed)
 RT Evaluate and Treat Note  01/12/2024   Breathing is (select one): Same as normal    The following was found on auscultation (select multiple):  Bilateral Breath Sounds: Clear;Diminished (01/12/24 0825)  R Upper  Breath Sounds: Clear (01/11/24 2000) L Upper Breath Sounds: Clear (01/11/24 2000) R Lower Breath Sounds: Diminished (01/11/24 2000) L Lower Breath Sounds: Diminished (01/11/24 2000)    Cough Assessment: Cough: Strong (01/11/24 2000)    Most Recent Chest Xray:... (No results found.    The following medications and/or interventions were ordered/changed/discontinued as part of the Respiratory Treatment protocol:   Medication Changes: None   Airway Clearance Changes:  None   Oxygen Therapy Changes: None

## 2024-01-12 NOTE — TOC Progression Note (Signed)
 Transition of Care [facility]) - Progression Note    Patient Details  Name: Steven Christensen MRN: [id number] Date of Birth: Nov 15, 1970  Transition of Care [facility]) CM/SW Contact  Tom-Johnson, Harvest Muskrat, RN Phone Number: 01/12/2024, 1:42 PM  Clinical Narrative:     Patient continues to be intubated, GOC continues.  CM will continue to follow .                    Expected Discharge Plan and Services                                               Social Drivers of Health (SDOH) Interventions SDOH Screenings   Food Insecurity: Patient Unable To Answer (01/04/2024)  Housing: Patient Unable To Answer (01/04/2024)  Transportation Needs: Patient Unable To Answer (01/04/2024)  Utilities: Patient Unable To Answer (01/04/2024)  Tobacco Use: High Risk (01/10/2024)    Readmission Risk Interventions     No data to display

## 2024-01-13 DIAGNOSIS — G931 Anoxic brain damage, not elsewhere classified: Secondary | ICD-10-CM

## 2024-01-13 LAB — GLUCOSE, CAPILLARY
Glucose-Capillary: 109 mg/dL — ABNORMAL HIGH (ref 70–99)
Glucose-Capillary: 114 mg/dL — ABNORMAL HIGH (ref 70–99)
Glucose-Capillary: 127 mg/dL — ABNORMAL HIGH (ref 70–99)
Glucose-Capillary: 133 mg/dL — ABNORMAL HIGH (ref 70–99)
Glucose-Capillary: 134 mg/dL — ABNORMAL HIGH (ref 70–99)
Glucose-Capillary: 141 mg/dL — ABNORMAL HIGH (ref 70–99)
Glucose-Capillary: 144 mg/dL — ABNORMAL HIGH (ref 70–99)

## 2024-01-13 LAB — TRIGLYCERIDES: Triglycerides: 34 mg/dL (ref <150)

## 2024-01-13 NOTE — Progress Notes (Addendum)
 Palliative:  HPI: 53 y.o. male  with past medical history of polysubstance abuse, associated schizoaffective disorder, and diabetes who presented on 01/03/2024 with progressive altered mental status. On EMS arrival, he was found obtunded and hypoglycemic.  Intubated in the ED for airway protection.  Initial workup showed AKI, hypokalemia, and positive cocaine.  He is admitted with anoxic encephalopathy, acute respiratory failure, and acute renal failure. MRI brain with concern for widespread anoxic injury. Palliative Medicine has been consulted for goals of care discussions and complex medical decision making.   Reviewed notes. Assessed Elna. No family at bedside. No responsive to painful stimuli. No signs of distress. Tolerating ventilator support via ETT. Prognosis poor.   I called and spoke with mother, Naomie. Naomie shares that she will be coming to bedside later today. She confirms plans for one way extubation. She has good understanding of poor prognosis. She expresses that she has a lot of family and friends that have given her unsolicited advice or recommendations. She shares that she does not want Wm to suffer and knows that the best way we can help him is to keep him comfortable and liberate from ventilator support. We discussed timing. She tells me that she knows that her brother-in-law wishes to be here with her when we proceed. Her other son is coming in Sunday but has also told her that she does not need to wait for him. Ultimately Naomie tells me that she will want to proceed Saturday or Sunday. I shared that we can be prepared to provide him comfort in transition off the ventilator at any time she is ready to proceed over the next couple days. She verbalized understanding.   Naomie also shares about [place] 27 yo daughter. She shares that his daughter wanted to visit again but the mother did not want her to visit. Dorothy and I did discuss support for his daughter through [facility]  and I left information at bedside. Naomie does share that his daughter has been connected with counseling and supports through school, church, and primary provider.   All questions/concerns addressed. Emotional support provided. Updated attending and RN.   Exam: Unresponsive. No response to noxious stimuli. VSS. Tolerating vent via ETT - tolerating weaning but not waking up. Abd soft. Warm to touch.   Plan: - DNR - One way extubation to comfort care Saturday or Sunday  40 min  Bernarda Kitty, NP Palliative Medicine Team Pager [phone] (Please see [email or web address] for schedule) Team Phone [phone]

## 2024-01-13 NOTE — Progress Notes (Signed)
 NAME:  Steven Christensen, MRN:  [id number], DOB:  1971-01-05, LOS: 10 ADMISSION DATE:  01/03/2024, CONSULTATION DATE:  01/03/24 REFERRING MD:  EDP, CHIEF COMPLAINT:  AMS   History of Present Illness:  53 year old man w/ hx of polysubstance abuse, associated schizoaffective disorder, and DM with not feeling well since 9/28 with vomiting with progressive AMS.  Found obtunded by EMS with facial swelling, obtunded, hypoxic and hypoglycemic.  Intubated for airway protection.  AKI, hypokalemic, +cocaine.  Pan scan done and PCCM consulted.   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  Non compliant with most of his meds.  Pertinent  Medical History  Schizophrenia Polysubstance abuse- tobacco, cocaine, alcohol DM Suicide attempts  Significant Hospital Events: Including procedures, antibiotic start and stop dates in addition to other pertinent events   9/30 admit, intubated  10/4-Tolerated weaning for about an hour 10/5 uncontrolled hypertension, tolerating weaning 10/6 no acute issues overnight, tolerating SBT currently but mentation continues to preclude extubation.  Brain completed yesterday afternoon and consistent with widespread anoxic brain injury 10/7 GOC meeting yesterday- focus on comfort, allow for open visitation   Interim History / Subjective:  Patient remains unresponsive.  Apparently he was on pressure support ventilation yesterday.  This morning he does not have a gag or cough.  At times he is overbreathing the ventilator.  But for the most part he is in sync with the vent.  No family around.  I tried to call the mother no reply.  Palliative care is also following.  Objective    Blood pressure (!) 158/73, pulse 70, temperature 97.9 F (36.6 C), resp. rate 14, height 6' 4 (1.93 m), weight 120 kg, SpO2 100%.    Vent Mode: PSV;CPAP FiO2 (%):  [40 %] 40 % Set Rate:  [16 bmp] 16 bmp Vt Set:  [690  mL] 690 mL PEEP:  [5 cmH20] 5 cmH20 Pressure Support:  [8 cmH20] 8 cmH20 Plateau Pressure:  [15 cmH20-16 cmH20] 16 cmH20   Intake/Output Summary (Last 24 hours) at 01/13/2024 1507 Last data filed at 01/13/2024 1202 Gross per 24 hour  Intake 3035 ml  Output 1275 ml  Net 1760 ml   Filed Weights   01/11/24 0443 01/12/24 0500 01/13/24 0500  Weight: 119 kg 118.5 kg 120 kg    Examination: Gen:      Intubated, acutely ill appearing, no acute distress HEENT:  ETT to vent, both pupils equal and sluggish reacting to light Lungs:    sounds of mechanical ventilation auscultated no wheeze, no crackles CV:         RRR, S1-S2 heard no S3 no murmur Abd:      + bowel sounds; soft, non-tender; no palpable masses, no distension Ext:    No edema Skin:      Warm and dry; no rashes Neuro:   not sedated, not responsive   Resolved problem list  Sepsis Hypokalemia   Assessment and Plan    Anoxic encephalopathy Metabolic encephalopathy History of polysubstance abuse - Patient initially presented unresponsive with agonal respirations per EMS.  He required bag mask assisted ventilations immediately on arrival.  EMS attempted 2 unsuccessful intubations in the field but was able to support patient clinically with bag mask assist until ED arrival.  Per EMS run report it does appear patient had signs of facial and tongue swelling on their arrival concerning for potential angioedema resulting in hypoxemia -MRI brain 10/5  consistent with widespread anoxic brain injury P: Continue neuroprotective/seizure precautions  Continue to remain off sedation   Acute hypoxemic respiratory failure Aspiration pneumonia P:  Continue ventilator support and lung protective strategies  Continue LTVV  Wean PEEP and Fio2 requirements to sat goal of >92%  HOB > 30 degrees Plat < 30  Aim for Driving pressures < 15  Intermittent Chest X-ray and ABGS Obtain and follow cultures-blood and tracheal aspirate Continue to  remain off sedation, plan to one way extubate and transition to comfort, once family is ready   Right upper extremity blistering P: Continue local wound care    Acute kidney injury Hypernatremia  -Creatinine on admission 5.11 with GFR 13 on known renal baseline P: Continue to trend renal function daily -no labs today Continue to monitor and optimize electrolytes daily Continue to monitor urine output Continue strict I/Os Continue Adequate renal perfusion  Avoid nephrotoxic agents  Continue free H20 flushes, will increase  DM2 with Hypoglycemia A1C 9.3% P: Continue SSI for now  Continue CBG checks q 4 hours Continue tube feeds  History of paranoid schizophrenia P: Continue supportive care   Hypertension P: Continue amlodipine for now  Continue prn hydral  Goals of care See IPAL noted on 10/6- discussion with mother that significant anoxic brain injury noted on MRI, plan allow for open visitation, once family is ready transition to comfort care- one way extubation  P:  Once family is ready, transition to comfort care.  Call the mother today and could not talk to her went to voicemail.  Per palliative care family is planning to visit later today hoping to transition to comfort care tomorrow.  The patient is critically ill due to cardiac arrest, anoxic encephalopathy.  Critical care was necessary to treat or prevent imminent or life-threatening deterioration.  Critical care was time spent personally by me on the following activities: development of treatment plan with patient and/or surrogate as well as nursing, discussions with consultants, evaluation of patient's response to treatment, examination of patient, obtaining history from patient or surrogate, ordering and performing treatments and interventions, ordering and review of laboratory studies, ordering and review of radiographic studies, pulse oximetry, re-evaluation of patient's condition and participation in  multidisciplinary rounds.   Critical Care Time devoted to patient care services described in this note is 40 minutes. This time reflects time of care of this signee [facility] . This critical care time does not reflect separately billable procedures or procedure time, teaching time or supervisory time of PA/NP/Med student/Med Resident etc but could involve care discussion time.       [facility] Pulmonary and Critical Care Medicine 01/13/2024 3:07 PM  Pager: see AMION  If no response to pager , please call critical care on call (see AMION) until 7pm After 7:00 pm call Elink

## 2024-01-13 NOTE — Evaluation (Signed)
 RT Evaluate and Treat Note  01/13/2024   Breathing is (select one): Same as normal    The following was found on auscultation (select multiple):  Bilateral Breath Sounds: Diminished (01/13/24 0838)  R Upper  Breath Sounds: Rhonchi (01/13/24 0342) L Upper Breath Sounds: Rhonchi (01/13/24 0342) R Lower Breath Sounds: Diminished (01/13/24 0342) L Lower Breath Sounds: Diminished (01/13/24 0342)    Cough Assessment: Cough: Strong (01/12/24 2000)    Most Recent Chest Xray:... (No results found.    The following medications and/or interventions were ordered/changed/discontinued as part of the Respiratory Treatment protocol:   Medication Changes: None   Airway Clearance Changes:  None   Oxygen Therapy Changes: None

## 2024-01-14 LAB — GLUCOSE, CAPILLARY
Glucose-Capillary: 107 mg/dL — ABNORMAL HIGH (ref 70–99)
Glucose-Capillary: 107 mg/dL — ABNORMAL HIGH (ref 70–99)
Glucose-Capillary: 187 mg/dL — ABNORMAL HIGH (ref 70–99)
Glucose-Capillary: 269 mg/dL — ABNORMAL HIGH (ref 70–99)
Glucose-Capillary: 68 mg/dL — ABNORMAL LOW (ref 70–99)
Glucose-Capillary: 96 mg/dL (ref 70–99)
Glucose-Capillary: 99 mg/dL (ref 70–99)

## 2024-01-14 MED ORDER — DEXTROSE 50 % IV SOLN
INTRAVENOUS | Status: AC
  Administered 2024-01-14 (×2): 50 mL via INTRAVENOUS
  Filled 2024-01-14: qty 50

## 2024-01-14 MED ORDER — DEXTROSE 50 % IV SOLN: 50.0000 mL | Freq: Once | INTRAVENOUS | Status: AC

## 2024-01-14 NOTE — Evaluation (Signed)
 RT Evaluate and Treat Note  01/14/2024   Breathing is (select one): None/no change   The following was found on auscultation (select multiple):  Bilateral Breath Sounds: Rhonchi;Diminished (01/14/24 0754)  R Upper  Breath Sounds: Clear (01/14/24 0343) L Upper Breath Sounds: Clear (01/14/24 0343) R Lower Breath Sounds: Diminished (01/14/24 0343) L Lower Breath Sounds: Diminished (01/14/24 0343)    Cough Assessment: Cough: Strong (01/13/24 2000)    Most Recent Chest Xray:... (No results found.    The following medications and/or interventions were ordered/changed/discontinued as part of the Respiratory Treatment protocol:   Medication Changes: no changes   Airway Clearance Changes: no changes   Oxygen Therapy Changes: no changes

## 2024-01-14 NOTE — Progress Notes (Signed)
 NAME:  Steven Christensen, MRN:  [id number], DOB:  1970-05-28, LOS: 11 ADMISSION DATE:  01/03/2024, CONSULTATION DATE:  01/03/24 REFERRING MD:  EDP, CHIEF COMPLAINT:  AMS   History of Present Illness:  53 year old man w/ hx of polysubstance abuse, associated schizoaffective disorder, and DM with not feeling well since 9/28 with vomiting with progressive AMS.  Found obtunded by EMS with facial swelling, obtunded, hypoxic and hypoglycemic.  Intubated for airway protection.  AKI, hypokalemic, +cocaine.  Pan scan done and PCCM consulted.   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  Non compliant with most of his meds.  Pertinent  Medical History  Schizophrenia Polysubstance abuse- tobacco, cocaine, alcohol DM Suicide attempts  Significant Hospital Events: Including procedures, antibiotic start and stop dates in addition to other pertinent events   9/30 admit, intubated  10/4-Tolerated weaning for about an hour 10/5 uncontrolled hypertension, tolerating weaning 10/6 no acute issues overnight, tolerating SBT currently but mentation continues to preclude extubation.  Brain completed yesterday afternoon and consistent with widespread anoxic brain injury 10/7 GOC meeting yesterday- focus on comfort, allow for open visitation   Interim History / Subjective:  Patient remains unresponsive does not respond to pain.  He is on pressure support ventilation today.  Awaiting family's input further.  Palliative care has been talking to the mother who stated she is leaning toward comfort once all the family makes her visitations.  Objective    Blood pressure (!) 185/55, pulse 75, temperature 98.1 F (36.7 C), resp. rate 19, height 6' 4 (1.93 m), weight 120 kg, SpO2 99%.    Vent Mode: CPAP;PSV FiO2 (%):  [40 %] 40 % Set Rate:  [16 bmp] 16 bmp Vt Set:  [600 mL] 600 mL PEEP:  [5 cmH20] 5 cmH20 Pressure Support:  [5  cmH20-8 cmH20] 5 cmH20 Plateau Pressure:  [12 cmH20-16 cmH20] 12 cmH20   Intake/Output Summary (Last 24 hours) at 01/14/2024 1750 Last data filed at 01/14/2024 1722 Gross per 24 hour  Intake 2515 ml  Output 1525 ml  Net 990 ml   Filed Weights   01/11/24 0443 01/12/24 0500 01/13/24 0500  Weight: 119 kg 118.5 kg 120 kg    Examination: Gen:      Intubated, acutely ill appearing, no acute distress HEENT: Head atraumatic normocephalic both pupils equal reactive to light Lungs:    Lungs bilaterally CV:         RRR, S1-S2 heard no S3 no murmur Abd:      + bowel sounds; soft, non-tender; no palpable masses, no distension Ext:    No edema Skin:      Warm and dry; no rashes Neuro:   not sedated, not responsive   Resolved problem list  Sepsis Hypokalemia   Assessment and Plan    Anoxic encephalopathy Metabolic encephalopathy History of polysubstance abuse - Patient initially presented unresponsive with agonal respirations per EMS.  He required bag mask assisted ventilations immediately on arrival.  EMS attempted 2 unsuccessful intubations in the field but was able to support patient clinically with bag mask assist until ED arrival.  Per EMS run report it does appear patient had signs of facial and tongue swelling on their arrival concerning for potential angioedema resulting in hypoxemia -MRI brain 10/5 consistent with widespread anoxic brain injury P: Continue neuroprotective/seizure precautions  Continue to remain off sedation   Acute hypoxemic respiratory failure Aspiration pneumonia P:  Continue ventilator  support and lung protective strategies  Continue LTVV  Wean PEEP and Fio2 requirements to sat goal of >92%  HOB > 30 degrees Plat < 30  Aim for Driving pressures < 15  Intermittent Chest X-ray and ABGS Obtain and follow cultures-blood and tracheal aspirate Continue to remain off sedation, plan to one way extubate and transition to comfort, once family is ready    Right upper extremity blistering P: Continue local wound care    Acute kidney injury Hypernatremia  -Creatinine on admission 5.11 with GFR 13 on known renal baseline P: Continue to trend renal function daily -no labs today Continue to monitor and optimize electrolytes daily Continue to monitor urine output Continue strict I/Os Continue Adequate renal perfusion  Avoid nephrotoxic agents  Continue free H20 flushes,   DM2 with Hypoglycemia A1C 9.3% P: Continue SSI for now  Continue CBG checks q 4 hours Continue tube feeds  History of paranoid schizophrenia P: Continue supportive care   Hypertension P: Continue amlodipine for now  Continue prn hydral  Goals of care See IPAL noted on 10/6- discussion with mother that significant anoxic brain injury noted on MRI, plan allow for open visitation, once family is ready transition to comfort care- one way extubation  P:  Once family is ready, transition to comfort care.   The patient is critically ill due to cardiac arrest, anoxic encephalopathy.  Critical care was necessary to treat or prevent imminent or life-threatening deterioration.  Critical care was time spent personally by me on the following activities: development of treatment plan with patient and/or surrogate as well as nursing, discussions with consultants, evaluation of patient's response to treatment, examination of patient, obtaining history from patient or surrogate, ordering and performing treatments and interventions, ordering and review of laboratory studies, ordering and review of radiographic studies, pulse oximetry, re-evaluation of patient's condition and participation in multidisciplinary rounds.   Critical Care Time devoted to patient care services described in this note is 31 minutes. This time reflects time of care of this signee [facility] . This critical care time does not reflect separately billable procedures or procedure time, teaching time or  supervisory time of PA/NP/Med student/Med Resident etc but could involve care discussion time.       [facility] Pulmonary and Critical Care Medicine 01/14/2024 5:50 PM  Pager: see AMION  If no response to pager , please call critical care on call (see AMION) until 7pm After 7:00 pm call Elink

## 2024-01-14 NOTE — Progress Notes (Signed)
 Palliative:  HPI: 53 y.o. male  with past medical history of polysubstance abuse, associated schizoaffective disorder, and diabetes who presented on 01/03/2024 with progressive altered mental status. On EMS arrival, he was found obtunded and hypoglycemic.  Intubated in the ED for airway protection.  Initial workup showed AKI, hypokalemia, and positive cocaine.  He is admitted with anoxic encephalopathy, acute respiratory failure, and acute renal failure. MRI brain with concern for widespread anoxic injury. Palliative Medicine has been consulted for goals of care discussions and complex medical decision making.   Reviewed records, notes, vitals. I met today at bedside with RN. No family present. No changes in status. I discussed with RN plans for one way extubation to comfort today or tomorrow - whenever mother decides. RN to contact myself or PCCM for assistance and orders if mother comes to bedside ready for transition. I anticipate he will do okay initially off ventilator support as he is tolerating weaning. I do not anticipate that he will be able to protect his airway. He may be a good candidate for hospice facility depending on status after extubation.   I also discussed with Dr. Gatha.   Exam: Unresponsive. No response to noxious stimuli. VSS. Tolerating vent via ETT - tolerating weaning but not waking up. Abd soft. Warm to touch.    Plan: - DNR - One way extubation to comfort care Saturday or Sunday   25 min  Bernarda Kitty, NP Palliative Medicine Team Pager [phone] (Please see [email or web address] for schedule) Team Phone [phone]

## 2024-01-15 LAB — GLUCOSE, CAPILLARY
Glucose-Capillary: 188 mg/dL — ABNORMAL HIGH (ref 70–99)
Glucose-Capillary: 158 mg/dL — ABNORMAL HIGH (ref 70–99)
Glucose-Capillary: 141 mg/dL — ABNORMAL HIGH (ref 70–99)
Glucose-Capillary: 74 mg/dL (ref 70–99)

## 2024-01-15 MED ORDER — GLYCOPYRROLATE 0.2 MG/ML IJ SOLN
0.4000 mg | Freq: Three times a day (TID) | INTRAMUSCULAR | Status: DC | PRN
  Administered 2024-01-15 – 2024-01-16 (×4): 0.4 mg via INTRAVENOUS
  Filled 2024-01-15 (×2): qty 2

## 2024-01-15 MED ORDER — FENTANYL 2500MCG IN NS 250ML (10MCG/ML) PREMIX INFUSION
INTRAVENOUS | Status: AC
  Administered 2024-01-15 (×2): 75 ug/h via INTRAVENOUS
  Filled 2024-01-15: qty 250

## 2024-01-15 MED ORDER — GLYCOPYRROLATE 0.2 MG/ML IJ SOLN: 0.2000 mg | INTRAMUSCULAR | Status: DC | PRN

## 2024-01-15 MED ORDER — ONDANSETRON 4 MG PO TBDP: 4.0000 mg | ORAL_TABLET | Freq: Four times a day (QID) | ORAL | Status: DC | PRN

## 2024-01-15 MED ORDER — ACETAMINOPHEN 650 MG RE SUPP: 650.0000 mg | Freq: Four times a day (QID) | RECTAL | Status: DC | PRN

## 2024-01-15 MED ORDER — FENTANYL CITRATE (PF) 50 MCG/ML IJ SOSY
25.0000 ug | PREFILLED_SYRINGE | INTRAMUSCULAR | Status: DC | PRN
  Administered 2024-01-15 (×2): 50 ug via INTRAVENOUS
  Administered 2024-01-16 (×6): 100 ug via INTRAVENOUS
  Filled 2024-01-15: qty 2

## 2024-01-15 MED ORDER — ACETAMINOPHEN 325 MG PO TABS: 650.0000 mg | ORAL_TABLET | Freq: Four times a day (QID) | ORAL | Status: DC | PRN

## 2024-01-15 MED ORDER — POLYVINYL ALCOHOL 1.4 % OP SOLN: 1.0000 [drp] | Freq: Four times a day (QID) | OPHTHALMIC | Status: DC | PRN

## 2024-01-15 MED ORDER — GLYCOPYRROLATE 1 MG PO TABS: 1.0000 mg | ORAL_TABLET | ORAL | Status: DC | PRN

## 2024-01-15 MED ORDER — FENTANYL 2500MCG IN NS 250ML (10MCG/ML) PREMIX INFUSION
0.0000 ug/h | INTRAVENOUS | Status: DC
  Administered 2024-01-15 (×2): 50 ug/h via INTRAVENOUS

## 2024-01-15 MED ORDER — LORAZEPAM 2 MG/ML IJ SOLN
2.0000 mg | INTRAMUSCULAR | Status: DC | PRN
  Administered 2024-01-16 (×2): 2 mg via INTRAVENOUS
  Filled 2024-01-15: qty 1

## 2024-01-15 MED ORDER — DIPHENHYDRAMINE HCL 50 MG/ML IJ SOLN: 25.0000 mg | INTRAMUSCULAR | Status: DC | PRN

## 2024-01-15 MED ORDER — ONDANSETRON HCL 4 MG/2ML IJ SOLN: 4.0000 mg | Freq: Four times a day (QID) | INTRAMUSCULAR | Status: DC | PRN

## 2024-01-15 MED ORDER — GLYCOPYRROLATE 0.2 MG/ML IJ SOLN
0.2000 mg | INTRAMUSCULAR | Status: DC | PRN
  Administered 2024-01-15 – 2024-01-16 (×4): 0.2 mg via INTRAVENOUS
  Filled 2024-01-15 (×2): qty 1

## 2024-01-15 MED ORDER — ORAL CARE MOUTH RINSE
15.0000 mL | OROMUCOSAL | Status: DC
  Administered 2024-01-15 (×2): 15 mL via OROMUCOSAL

## 2024-01-15 NOTE — Evaluation (Signed)
 RT Evaluate and Treat Note  01/15/2024   Breathing is (select one): Same as normal    The following was found on auscultation (select multiple):  Bilateral Breath Sounds: Rhonchi (01/15/24 0857)  R Upper  Breath Sounds: Clear (01/15/24 0205) L Upper Breath Sounds: Clear (01/15/24 0205) R Lower Breath Sounds: Diminished (01/15/24 0205) L Lower Breath Sounds: Diminished (01/15/24 0205)    Cough Assessment: Cough: Strong (01/14/24 2000)    Most Recent Chest Xray:... (No results found.    The following medications and/or interventions were ordered/changed/discontinued as part of the Respiratory Treatment protocol:   Medication Changes: none   Airway Clearance Changes: none   Oxygen Therapy Changes:none

## 2024-01-15 NOTE — Progress Notes (Addendum)
   NAME:  Steven Christensen, MRN:  [id number], DOB:  Dec 30, 1970, LOS: 12 ADMISSION DATE:  01/03/2024, CONSULTATION DATE:  01/03/24 REFERRING MD:  EDP, CHIEF COMPLAINT:  AMS   History of Present Illness:  53 year old man w/ hx of polysubstance abuse, associated schizoaffective disorder, and DM with not feeling well since 9/28 with vomiting with progressive AMS.  Found obtunded by EMS with facial swelling, obtunded, hypoxic and hypoglycemic.  Intubated for airway protection.  AKI, hypokalemic, +cocaine.  Pan scan done and PCCM consulted.   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  Non compliant with most of his meds.  Pertinent  Medical History  Schizophrenia Polysubstance abuse- tobacco, cocaine, alcohol DM Suicide attempts  Significant Hospital Events: Including procedures, antibiotic start and stop dates in addition to other pertinent events   9/30 admit, intubated  10/4-Tolerated weaning for about an hour 10/5 uncontrolled hypertension, tolerating weaning 10/6 no acute issues overnight, tolerating SBT currently but mentation continues to preclude extubation.  Brain completed yesterday afternoon and consistent with widespread anoxic brain injury 10/7 GOC meeting yesterday- focus on comfort, allow for open visitation  10/11 and 10/12 -after several conversations with the patient's mother and brothers and uncle patient is comfort care now.  Interim History / Subjective:  Patient remains responsive and not responding to pain.  Family in the room decided to go comfort.  Objective    Blood pressure (!) 159/77, pulse 84, temperature (!) 97.5 F (36.4 C), resp. rate 15, height 6' 4 (1.93 m), weight 119.1 kg, SpO2 100%.    Vent Mode: CPAP;PSV FiO2 (%):  [40 %] 40 % Set Rate:  [16 bmp] 16 bmp Vt Set:  [600 mL] 600 mL PEEP:  [5 cmH20] 5 cmH20 Pressure Support:  [5 cmH20] 5 cmH20 Plateau Pressure:  [12  cmH20] 12 cmH20   Intake/Output Summary (Last 24 hours) at 01/15/2024 1606 Last data filed at 01/15/2024 1600 Gross per 24 hour  Intake 2840 ml  Output 1725 ml  Net 1115 ml   Filed Weights   01/12/24 0500 01/13/24 0500 01/15/24 0500  Weight: 118.5 kg 120 kg 119.1 kg    Examination: Gen:      Intubated, acutely ill appearing, does not follow commands HEENT: Head atraumatic normocephalic both pupils equal reactive to light Lungs:    Lungs bilaterally CV:         RRR, S1-S2 heard no S3 no murmur Abd:      + bowel sounds; soft, non-tender; no palpable masses, no distension Ext:    No edema Skin:      Warm and dry; no rashes Neuro:   not sedated, not responsive   Resolved problem list  Sepsis Hypokalemia   Assessment and Plan    Anoxic encephalopathy Anoxic brain injury Metabolic encephalopathy History of polysubstance abuse Acute hypoxemic respiratory failure Aspiration pneumonia Right upper extremity blistering Acute kidney injury Hypernatremia  DM2 with Hypoglycemia History of paranoid schizophrenia Hypertension  - Transition to comfort cares - Orders are placed - Please see IPAL note for discussion with the family.  35 minutes spent on critical care time  [facility] Pulmonary and Critical Care Medicine 01/15/2024 4:06 PM  Pager: see AMION  If no response to pager , please call critical care on call (see AMION) until 7pm After 7:00 pm call Elink

## 2024-01-15 NOTE — Plan of Care (Signed)
  Interdisciplinary Goals of Care Family Meeting   Date carried out:: 01/15/2024  Location of the meeting: Unit  Member's involved: Physician  Acting medical decision maker: Steven Christensen patient's mother  Discussion: We discussed goals of care for Steven Christensen .  I had a long discussion with the patient's mother Steven Christensen and her brother of the patient yesterday.  Today I had continued discussion with the patient's mother and another brother who visited from [place]  and patient's uncle who is visiting from [facility].  All of them have been updated.  They understand that the patient sustained anoxic brain injury.  At this stage if they want full cares the next up would be tracheostomy and PEG tube.  The patient's mother brothers and uncle made it very clear that the patient would never have wanted to live like this.  He would never like to be at his facility.  They understand the duration as more suffering to the patient and wants compassionate extubation with comfort care measures.  CODE STATUS is changed and comfort care orders are placed with extubation.  Code status: Full DNR with comfort care  Disposition: In-patient comfort care   Time spent for the meeting: 20 minutes   Tamela Stakes, MD  Attending Physician, Critical Care Medicine [place] [facility] Critical Care See Amion for pager If no response to pager, please call [phone] until 7pm After 7pm, Please call E-link [phone]

## 2024-01-15 NOTE — Progress Notes (Signed)
 Compassionate extubation performed at this time w/ RN at bedside.

## 2024-01-17 ENCOUNTER — Encounter (HOSPITAL_COMMUNITY): Payer: Self-pay

## 2024-02-04 NOTE — Progress Notes (Signed)
   NAME:  Steven Christensen, MRN:  [id number], DOB:  05-02-1970, LOS: 13 ADMISSION DATE:  01/03/2024, CONSULTATION DATE:  01/03/24 REFERRING MD:  EDP, CHIEF COMPLAINT:  AMS   History of Present Illness:  53 year old man w/ hx of polysubstance abuse, associated schizoaffective disorder, and DM with not feeling well since 9/28 with vomiting with progressive AMS.  Found obtunded by EMS with facial swelling, obtunded, hypoxic and hypoglycemic.  Intubated for airway protection.  AKI, hypokalemic, +cocaine.  Pan scan done and PCCM consulted.   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  Non compliant with most of his meds.  Pertinent  Medical History  Schizophrenia Polysubstance abuse- tobacco, cocaine, alcohol DM Suicide attempts  Significant Hospital Events: Including procedures, antibiotic start and stop dates in addition to other pertinent events   9/30 admit, intubated  10/4-Tolerated weaning for about an hour 10/5 uncontrolled hypertension, tolerating weaning 10/6 no acute issues overnight, tolerating SBT currently but mentation continues to preclude extubation.  Brain completed yesterday afternoon and consistent with widespread anoxic brain injury 10/7 GOC meeting yesterday- focus on comfort, allow for open visitation  10/11 and 10/12 -after several conversations with the patient's mother and brothers and uncle patient is comfort care now.  Interim History / Subjective:  Extubated to comfort yesterday. Appear comfortable, in no distress on fentanyl infusion  Objective    Blood pressure (!) 149/81, pulse 80, temperature 98.1 F (36.7 C), temperature source Bladder, resp. rate 11, height 6' 4 (1.93 m), weight 119.1 kg, SpO2 (!) 60%.    Vent Mode: CPAP;PSV FiO2 (%):  [40 %] 40 % PEEP:  [5 cmH20] 5 cmH20 Pressure Support:  [5 cmH20] 5 cmH20   Intake/Output Summary (Last 24 hours) at 01/19/24 0951 Last  data filed at 01/19/24 0900 Gross per 24 hour  Intake 935.57 ml  Output 1550 ml  Net -614.43 ml   Filed Weights   01/12/24 0500 01/13/24 0500 01/15/24 0500  Weight: 118.5 kg 120 kg 119.1 kg    Examination:  General:  Thin middle aged male resting comfortably in bed Neuro:  Resting comfortably HEENT:  [place]/AT, no appreciable JVD Cardiovascular:  regular rate and rhythm  Lungs:  unlabored   Resolved problem list  Sepsis Hypokalemia   Assessment and Plan    Anoxic encephalopathy Anoxic brain injury Metabolic encephalopathy History of polysubstance abuse Acute hypoxemic respiratory failure Aspiration pneumonia Right upper extremity blistering Acute kidney injury Hypernatremia  DM2 with Hypoglycemia History of paranoid schizophrenia Hypertension  - Continue comfort measures with fentanyl infusion - Transfer to palliative floor - PCCM will sign off - glycopyrrolate for secretion management   Deward Eastern, AGACNP-BC [facility]  See Amion for personal pager PCCM on call pager [phone] until 7pm. Please call Elink 7p-7a. [phone]  Jan 19, 2024 9:55 AM
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
## 2024-02-04 NOTE — TOC Progression Note (Signed)
 Transition of Care [facility]) - Progression Note    Patient Details  Name: Steven Christensen MRN: [id number] Date of Birth: [birth year]  Transition of Care [facility]) CM/SW Contact  Tom-Johnson, Harvest Muskrat, RN Phone Number: 2024/02/14, 9:07 AM  Clinical Narrative:     Patient transitioned to Comfort Care yesterday 01/15/24.  CM will continue to follow and render compassionate support at this time.                     Expected Discharge Plan and Services                                               Social Drivers of Health (SDOH) Interventions SDOH Screenings   Food Insecurity: Patient Unable To Answer (01/04/2024)  Housing: Patient Unable To Answer (01/04/2024)  Transportation Needs: Patient Unable To Answer (01/04/2024)  Utilities: Patient Unable To Answer (01/04/2024)  Tobacco Use: High Risk (01/10/2024)    Readmission Risk Interventions     No data to display

## 2024-02-04 NOTE — Progress Notes (Signed)
 Nutrition Brief Note  Chart reviewed. Pt now transitioning to comfort care.  No further nutrition interventions planned at this time.  Please re-consult as needed.   Drusilla Kanner, RDN, LDN Clinical Nutrition See AMiON for contact information.

## 2024-02-04 DEATH — deceased
